# Patient Record
Sex: Female | Born: 1935 | Race: White | Hispanic: No | Marital: Married | Smoking: Former smoker
Health system: Southern US, Community
[De-identification: ages and names within clinical notes are randomized; demographics above are authoritative.]

## PROBLEM LIST (undated history)

## (undated) DIAGNOSIS — J301 Allergic rhinitis due to pollen: Secondary | ICD-10-CM

## (undated) DIAGNOSIS — J3089 Other allergic rhinitis: Secondary | ICD-10-CM

## (undated) DIAGNOSIS — N952 Postmenopausal atrophic vaginitis: Secondary | ICD-10-CM

## (undated) DIAGNOSIS — K921 Melena: Secondary | ICD-10-CM

## (undated) DIAGNOSIS — E785 Hyperlipidemia, unspecified: Secondary | ICD-10-CM

## (undated) DIAGNOSIS — N390 Urinary tract infection, site not specified: Secondary | ICD-10-CM

## (undated) DIAGNOSIS — C801 Malignant (primary) neoplasm, unspecified: Secondary | ICD-10-CM

## (undated) DIAGNOSIS — K219 Gastro-esophageal reflux disease without esophagitis: Secondary | ICD-10-CM

## (undated) DIAGNOSIS — N809 Endometriosis, unspecified: Secondary | ICD-10-CM

## (undated) DIAGNOSIS — G479 Sleep disorder, unspecified: Secondary | ICD-10-CM

## (undated) DIAGNOSIS — M810 Age-related osteoporosis without current pathological fracture: Secondary | ICD-10-CM

## (undated) DIAGNOSIS — M8580 Other specified disorders of bone density and structure, unspecified site: Secondary | ICD-10-CM

## (undated) HISTORY — DX: Other specified disorders of bone density and structure, unspecified site: M85.80

## (undated) HISTORY — DX: Urinary tract infection, site not specified: N39.0

## (undated) HISTORY — PX: APPENDECTOMY: SHX54

## (undated) HISTORY — DX: Endometriosis, unspecified: N80.9

## (undated) HISTORY — DX: Malignant (primary) neoplasm, unspecified: C80.1

## (undated) HISTORY — DX: Allergic rhinitis due to pollen: J30.1

## (undated) HISTORY — DX: Melena: K92.1

## (undated) HISTORY — DX: Sleep disorder, unspecified: G47.9

## (undated) HISTORY — DX: Age-related osteoporosis without current pathological fracture: M81.0

## (undated) HISTORY — DX: Postmenopausal atrophic vaginitis: N95.2

## (undated) HISTORY — DX: Hyperlipidemia, unspecified: E78.5

## (undated) HISTORY — PX: CATARACT EXTRACTION W/ INTRAOCULAR LENS  IMPLANT, BILATERAL: SHX1307

## (undated) HISTORY — PX: ROTATOR CUFF REPAIR: SHX139

## (undated) HISTORY — PX: ABDOMINAL HYSTERECTOMY: SHX81

---

## 2011-08-25 DIAGNOSIS — M8580 Other specified disorders of bone density and structure, unspecified site: Secondary | ICD-10-CM

## 2011-08-25 HISTORY — DX: Other specified disorders of bone density and structure, unspecified site: M85.80

## 2012-01-10 DIAGNOSIS — S52509A Unspecified fracture of the lower end of unspecified radius, initial encounter for closed fracture: Secondary | ICD-10-CM | POA: Diagnosis not present

## 2012-01-10 DIAGNOSIS — S52609A Unspecified fracture of lower end of unspecified ulna, initial encounter for closed fracture: Secondary | ICD-10-CM | POA: Diagnosis not present

## 2012-02-20 DIAGNOSIS — S5290XA Unspecified fracture of unspecified forearm, initial encounter for closed fracture: Secondary | ICD-10-CM | POA: Diagnosis not present

## 2012-04-25 DIAGNOSIS — M25639 Stiffness of unspecified wrist, not elsewhere classified: Secondary | ICD-10-CM | POA: Diagnosis not present

## 2012-04-25 DIAGNOSIS — M25539 Pain in unspecified wrist: Secondary | ICD-10-CM | POA: Diagnosis not present

## 2012-04-25 DIAGNOSIS — S52539A Colles' fracture of unspecified radius, initial encounter for closed fracture: Secondary | ICD-10-CM | POA: Diagnosis not present

## 2012-04-30 DIAGNOSIS — M25539 Pain in unspecified wrist: Secondary | ICD-10-CM | POA: Diagnosis not present

## 2012-04-30 DIAGNOSIS — M25639 Stiffness of unspecified wrist, not elsewhere classified: Secondary | ICD-10-CM | POA: Diagnosis not present

## 2012-04-30 DIAGNOSIS — S52539A Colles' fracture of unspecified radius, initial encounter for closed fracture: Secondary | ICD-10-CM | POA: Diagnosis not present

## 2012-05-08 DIAGNOSIS — M25539 Pain in unspecified wrist: Secondary | ICD-10-CM | POA: Diagnosis not present

## 2012-05-08 DIAGNOSIS — S52539A Colles' fracture of unspecified radius, initial encounter for closed fracture: Secondary | ICD-10-CM | POA: Diagnosis not present

## 2012-05-08 DIAGNOSIS — M25639 Stiffness of unspecified wrist, not elsewhere classified: Secondary | ICD-10-CM | POA: Diagnosis not present

## 2012-05-11 DIAGNOSIS — M810 Age-related osteoporosis without current pathological fracture: Secondary | ICD-10-CM | POA: Diagnosis not present

## 2012-05-11 DIAGNOSIS — E559 Vitamin D deficiency, unspecified: Secondary | ICD-10-CM | POA: Diagnosis not present

## 2012-05-11 DIAGNOSIS — E785 Hyperlipidemia, unspecified: Secondary | ICD-10-CM | POA: Diagnosis not present

## 2012-05-14 DIAGNOSIS — M25539 Pain in unspecified wrist: Secondary | ICD-10-CM | POA: Diagnosis not present

## 2012-05-14 DIAGNOSIS — M25639 Stiffness of unspecified wrist, not elsewhere classified: Secondary | ICD-10-CM | POA: Diagnosis not present

## 2012-05-14 DIAGNOSIS — S52539A Colles' fracture of unspecified radius, initial encounter for closed fracture: Secondary | ICD-10-CM | POA: Diagnosis not present

## 2012-05-16 DIAGNOSIS — M25639 Stiffness of unspecified wrist, not elsewhere classified: Secondary | ICD-10-CM | POA: Diagnosis not present

## 2012-05-16 DIAGNOSIS — S52539A Colles' fracture of unspecified radius, initial encounter for closed fracture: Secondary | ICD-10-CM | POA: Diagnosis not present

## 2012-05-16 DIAGNOSIS — M25539 Pain in unspecified wrist: Secondary | ICD-10-CM | POA: Diagnosis not present

## 2012-05-21 DIAGNOSIS — M25539 Pain in unspecified wrist: Secondary | ICD-10-CM | POA: Diagnosis not present

## 2012-05-21 DIAGNOSIS — M25639 Stiffness of unspecified wrist, not elsewhere classified: Secondary | ICD-10-CM | POA: Diagnosis not present

## 2012-05-21 DIAGNOSIS — S52539A Colles' fracture of unspecified radius, initial encounter for closed fracture: Secondary | ICD-10-CM | POA: Diagnosis not present

## 2012-05-23 DIAGNOSIS — M25539 Pain in unspecified wrist: Secondary | ICD-10-CM | POA: Diagnosis not present

## 2012-05-23 DIAGNOSIS — M25639 Stiffness of unspecified wrist, not elsewhere classified: Secondary | ICD-10-CM | POA: Diagnosis not present

## 2012-05-23 DIAGNOSIS — S52539A Colles' fracture of unspecified radius, initial encounter for closed fracture: Secondary | ICD-10-CM | POA: Diagnosis not present

## 2012-05-29 DIAGNOSIS — S52539A Colles' fracture of unspecified radius, initial encounter for closed fracture: Secondary | ICD-10-CM | POA: Diagnosis not present

## 2012-05-29 DIAGNOSIS — M25639 Stiffness of unspecified wrist, not elsewhere classified: Secondary | ICD-10-CM | POA: Diagnosis not present

## 2012-05-29 DIAGNOSIS — M25539 Pain in unspecified wrist: Secondary | ICD-10-CM | POA: Diagnosis not present

## 2012-05-31 DIAGNOSIS — M25639 Stiffness of unspecified wrist, not elsewhere classified: Secondary | ICD-10-CM | POA: Diagnosis not present

## 2012-05-31 DIAGNOSIS — M25539 Pain in unspecified wrist: Secondary | ICD-10-CM | POA: Diagnosis not present

## 2012-05-31 DIAGNOSIS — S52539A Colles' fracture of unspecified radius, initial encounter for closed fracture: Secondary | ICD-10-CM | POA: Diagnosis not present

## 2012-06-04 DIAGNOSIS — M25539 Pain in unspecified wrist: Secondary | ICD-10-CM | POA: Diagnosis not present

## 2012-06-04 DIAGNOSIS — M25639 Stiffness of unspecified wrist, not elsewhere classified: Secondary | ICD-10-CM | POA: Diagnosis not present

## 2012-06-04 DIAGNOSIS — S52539A Colles' fracture of unspecified radius, initial encounter for closed fracture: Secondary | ICD-10-CM | POA: Diagnosis not present

## 2012-06-07 DIAGNOSIS — S52539A Colles' fracture of unspecified radius, initial encounter for closed fracture: Secondary | ICD-10-CM | POA: Diagnosis not present

## 2012-06-07 DIAGNOSIS — I2510 Atherosclerotic heart disease of native coronary artery without angina pectoris: Secondary | ICD-10-CM | POA: Diagnosis not present

## 2012-06-07 DIAGNOSIS — Z8249 Family history of ischemic heart disease and other diseases of the circulatory system: Secondary | ICD-10-CM | POA: Diagnosis not present

## 2012-06-07 DIAGNOSIS — M25639 Stiffness of unspecified wrist, not elsewhere classified: Secondary | ICD-10-CM | POA: Diagnosis not present

## 2012-06-07 DIAGNOSIS — M25539 Pain in unspecified wrist: Secondary | ICD-10-CM | POA: Diagnosis not present

## 2012-06-07 DIAGNOSIS — Z136 Encounter for screening for cardiovascular disorders: Secondary | ICD-10-CM | POA: Diagnosis not present

## 2012-06-11 DIAGNOSIS — Z961 Presence of intraocular lens: Secondary | ICD-10-CM | POA: Diagnosis not present

## 2012-06-13 DIAGNOSIS — S52539A Colles' fracture of unspecified radius, initial encounter for closed fracture: Secondary | ICD-10-CM | POA: Diagnosis not present

## 2012-06-13 DIAGNOSIS — M25539 Pain in unspecified wrist: Secondary | ICD-10-CM | POA: Diagnosis not present

## 2012-06-13 DIAGNOSIS — M25639 Stiffness of unspecified wrist, not elsewhere classified: Secondary | ICD-10-CM | POA: Diagnosis not present

## 2012-06-15 DIAGNOSIS — S52539A Colles' fracture of unspecified radius, initial encounter for closed fracture: Secondary | ICD-10-CM | POA: Diagnosis not present

## 2012-06-15 DIAGNOSIS — M25639 Stiffness of unspecified wrist, not elsewhere classified: Secondary | ICD-10-CM | POA: Diagnosis not present

## 2012-06-15 DIAGNOSIS — M25539 Pain in unspecified wrist: Secondary | ICD-10-CM | POA: Diagnosis not present

## 2012-06-18 DIAGNOSIS — M25639 Stiffness of unspecified wrist, not elsewhere classified: Secondary | ICD-10-CM | POA: Diagnosis not present

## 2012-06-18 DIAGNOSIS — M25539 Pain in unspecified wrist: Secondary | ICD-10-CM | POA: Diagnosis not present

## 2012-06-18 DIAGNOSIS — S52539A Colles' fracture of unspecified radius, initial encounter for closed fracture: Secondary | ICD-10-CM | POA: Diagnosis not present

## 2012-06-20 DIAGNOSIS — M25639 Stiffness of unspecified wrist, not elsewhere classified: Secondary | ICD-10-CM | POA: Diagnosis not present

## 2012-06-20 DIAGNOSIS — M25539 Pain in unspecified wrist: Secondary | ICD-10-CM | POA: Diagnosis not present

## 2012-06-20 DIAGNOSIS — S52539A Colles' fracture of unspecified radius, initial encounter for closed fracture: Secondary | ICD-10-CM | POA: Diagnosis not present

## 2012-07-07 DIAGNOSIS — Z23 Encounter for immunization: Secondary | ICD-10-CM | POA: Diagnosis not present

## 2012-11-30 DIAGNOSIS — D126 Benign neoplasm of colon, unspecified: Secondary | ICD-10-CM | POA: Diagnosis not present

## 2013-01-16 DIAGNOSIS — K648 Other hemorrhoids: Secondary | ICD-10-CM | POA: Diagnosis not present

## 2013-01-16 DIAGNOSIS — D128 Benign neoplasm of rectum: Secondary | ICD-10-CM | POA: Diagnosis not present

## 2013-01-16 DIAGNOSIS — D129 Benign neoplasm of anus and anal canal: Secondary | ICD-10-CM | POA: Diagnosis not present

## 2013-01-16 DIAGNOSIS — Z86010 Personal history of colonic polyps: Secondary | ICD-10-CM | POA: Diagnosis not present

## 2013-06-13 DIAGNOSIS — I6529 Occlusion and stenosis of unspecified carotid artery: Secondary | ICD-10-CM | POA: Diagnosis not present

## 2013-06-24 LAB — HM COLONOSCOPY: HM Colonoscopy: NORMAL

## 2013-10-15 DIAGNOSIS — J329 Chronic sinusitis, unspecified: Secondary | ICD-10-CM | POA: Diagnosis not present

## 2013-10-29 ENCOUNTER — Encounter: Payer: Self-pay | Admitting: Internal Medicine

## 2013-10-29 ENCOUNTER — Ambulatory Visit (INDEPENDENT_AMBULATORY_CARE_PROVIDER_SITE_OTHER): Payer: Federal, State, Local not specified - PPO | Admitting: Internal Medicine

## 2013-10-29 VITALS — BP 126/70 | HR 71 | Temp 98.6°F | Ht 64.5 in | Wt 136.0 lb

## 2013-10-29 DIAGNOSIS — N952 Postmenopausal atrophic vaginitis: Secondary | ICD-10-CM | POA: Insufficient documentation

## 2013-10-29 DIAGNOSIS — G479 Sleep disorder, unspecified: Secondary | ICD-10-CM

## 2013-10-29 DIAGNOSIS — J301 Allergic rhinitis due to pollen: Secondary | ICD-10-CM | POA: Diagnosis not present

## 2013-10-29 DIAGNOSIS — E785 Hyperlipidemia, unspecified: Secondary | ICD-10-CM | POA: Diagnosis not present

## 2013-10-29 MED ORDER — ESZOPICLONE 2 MG PO TABS: 2.0000 mg | ORAL_TABLET | Freq: Every evening | ORAL | Status: DC | PRN

## 2013-10-29 NOTE — Progress Notes (Signed)
Subjective:    Patient ID: Annette Lee, female    DOB: 12/03/1935, 78 y.o.   MRN: [id number]  HPI Recently moved to [facility] about 2 months ago From [facility] Originally from [place]  Fairly healthy Bad fall allergies Uses benedryl prn-no side effects and it is effective for her Recent sinus infection ---was on augmentin Using hydrocodone syrup at night  Is on statin Had CT in [place]-- showed some calcium Was Rx'd the atorvastatin--- and has been on it since Labs just done  Mild sleep problems Used lunesta regularly till moving back here Now only needs it "if a lot is on my mind"  Uses estrace cream for vaginal atrophy Noted on exam She had no symptoms  No current outpatient prescriptions on file prior to visit.   No current facility-administered medications on file prior to visit.    Allergies  Allergen Reactions  . Contrast Media [Iodinated Diagnostic Agents] Anaphylaxis    Past Medical History  Diagnosis Date  . Vaginal atrophy   . Hyperlipidemia   . Allergic rhinitis due to pollen   . Sleep disturbance     Past Surgical History  Procedure Laterality Date  . Appendectomy    . Abdominal hysterectomy    . Rotator cuff repair Right ~2001  . Cataract extraction w/ intraocular lens  implant, bilateral      Family History  Problem Relation Age of Onset  . Cancer Mother     CLL  . Cancer Father     colon  . Aneurysm Father   . Cancer Brother     colon cancer  . Heart disease Maternal Uncle   . Diabetes Neg Hx     History   Social History  . Marital Status: Married    Spouse Name: N/A    Number of Children: 0  . Years of Education: N/A   Occupational History  . Artist/designer/substitute teacher     Retired   Social History Main Topics  . Smoking status: Never Smoker   . Smokeless tobacco: Never Used  . Alcohol Use: Yes  . Drug Use: No  . Sexual Activity: Not on file   Other Topics Concern  . Not on file   Social  History Narrative   Has living will   Husband is health care POA.   Would accept resuscitation but no prolonged ventilation   Not sure about tube feeds   Review of Systems  Constitutional: Negative for fatigue and unexpected weight change.       Tries to exercise  HENT: Positive for congestion, hearing loss, rhinorrhea and tinnitus.        Fall allergies Hearing off some--?from allergies Regular with dentist  Eyes: Negative for visual disturbance.       Needs new glasses  Respiratory: Negative for cough, chest tightness and shortness of breath.   Cardiovascular: Negative for chest pain, palpitations and leg swelling.  Gastrointestinal: Negative for nausea, vomiting, abdominal pain, constipation and blood in stool.       Rare heartburn--dietary  Genitourinary: Negative for difficulty urinating and dyspareunia.  Musculoskeletal: Negative for back pain and joint swelling.       Right thumb arthritis  Skin: Negative for rash.       Stable dark areas  Allergic/Immunologic: Negative for environmental allergies and immunocompromised state.  Neurological: Negative for dizziness, syncope, light-headedness and headaches.  Psychiatric/Behavioral: Positive for sleep disturbance. Negative for dysphoric mood. The patient is not nervous/anxious.  Objective:   Physical Exam  Constitutional: She appears well-developed and well-nourished. No distress.  HENT:  Mouth/Throat: Oropharynx is clear and moist. No oropharyngeal exudate.  Eyes: Conjunctivae and EOM are normal. Pupils are equal, round, and reactive to light.  Neck: Normal range of motion. Neck supple. No thyromegaly present.  Cardiovascular: Normal rate, regular rhythm, normal heart sounds and intact distal pulses.  Exam reveals no gallop.   No murmur heard. Pulmonary/Chest: Effort normal and breath sounds normal. No respiratory distress. She has no wheezes. She has no rales.  Abdominal: Soft. There is no tenderness.    Musculoskeletal: She exhibits no edema and no tenderness.  Lymphadenopathy:    She has no cervical adenopathy.  Skin: Skin is warm. No rash noted. No erythema.  Psychiatric: She has a normal mood and affect. Her behavior is normal.          Assessment & Plan:

## 2013-10-29 NOTE — Assessment & Plan Note (Signed)
Doing better now Just uses the med rarely

## 2013-10-29 NOTE — Assessment & Plan Note (Signed)
Discussed non sedating antihistamines 

## 2013-10-29 NOTE — Assessment & Plan Note (Signed)
Just on exam---no symptoms I have recommended she stop the estrogen cream

## 2013-10-29 NOTE — Patient Instructions (Signed)
Please try cetirizine 10mg  daily, fexofenadine 180mg  daily or loratadine 10-20mg  daily for your allergies.

## 2013-10-29 NOTE — Assessment & Plan Note (Signed)
Discussed primary prevention She is not sure if she wants to continue

## 2013-10-29 NOTE — Progress Notes (Signed)
Pre-visit discussion using our clinic review tool. No additional management support is needed unless otherwise documented below in the visit note.  

## 2013-10-31 ENCOUNTER — Encounter: Payer: Self-pay | Admitting: Internal Medicine

## 2013-12-04 DIAGNOSIS — E559 Vitamin D deficiency, unspecified: Secondary | ICD-10-CM | POA: Diagnosis not present

## 2013-12-04 DIAGNOSIS — G4700 Insomnia, unspecified: Secondary | ICD-10-CM | POA: Diagnosis not present

## 2013-12-04 DIAGNOSIS — E785 Hyperlipidemia, unspecified: Secondary | ICD-10-CM | POA: Diagnosis not present

## 2014-05-27 ENCOUNTER — Telehealth: Payer: Self-pay | Admitting: Internal Medicine

## 2014-05-27 NOTE — Telephone Encounter (Signed)
[place] with me if ok with Dr. Silvio Pate.

## 2014-05-27 NOTE — Telephone Encounter (Signed)
Patient is asking to switch to Dr.Aron.  Patient said she didn't feel a connection with Dr.Letvak.  Can patient switch to Dr.Aron?

## 2014-05-28 NOTE — Telephone Encounter (Signed)
That is fine 

## 2014-07-08 ENCOUNTER — Ambulatory Visit (INDEPENDENT_AMBULATORY_CARE_PROVIDER_SITE_OTHER): Payer: Medicare Other | Admitting: Family Medicine

## 2014-07-08 ENCOUNTER — Encounter: Payer: Self-pay | Admitting: Family Medicine

## 2014-07-08 VITALS — BP 132/72 | HR 78 | Temp 98.0°F | Ht 64.0 in | Wt 129.8 lb

## 2014-07-08 DIAGNOSIS — E785 Hyperlipidemia, unspecified: Secondary | ICD-10-CM

## 2014-07-08 DIAGNOSIS — Z0000 Encounter for general adult medical examination without abnormal findings: Secondary | ICD-10-CM

## 2014-07-08 DIAGNOSIS — Z23 Encounter for immunization: Secondary | ICD-10-CM | POA: Diagnosis not present

## 2014-07-08 DIAGNOSIS — G479 Sleep disorder, unspecified: Secondary | ICD-10-CM

## 2014-07-08 DIAGNOSIS — N952 Postmenopausal atrophic vaginitis: Secondary | ICD-10-CM

## 2014-07-08 LAB — COMPREHENSIVE METABOLIC PANEL
ALBUMIN: 4 g/dL (ref 3.5–5.2)
ALT: 14 U/L (ref 0–35)
AST: 21 U/L (ref 0–37)
Alkaline Phosphatase: 57 U/L (ref 39–117)
BUN: 16 mg/dL (ref 6–23)
CHLORIDE: 107 meq/L (ref 96–112)
CO2: 28 mEq/L (ref 19–32)
Calcium: 9.5 mg/dL (ref 8.4–10.5)
Creatinine, Ser: 0.8 mg/dL (ref 0.4–1.2)
GFR: 74.11 mL/min (ref >60.00)
Glucose, Bld: 101 mg/dL — ABNORMAL HIGH (ref 70–99)
POTASSIUM: 4.4 meq/L (ref 3.5–5.1)
SODIUM: 141 meq/L (ref 135–145)
TOTAL PROTEIN: 7.1 g/dL (ref 6.0–8.3)
Total Bilirubin: 0.6 mg/dL (ref 0.2–1.2)

## 2014-07-08 LAB — LIPID PANEL
CHOL/HDL RATIO: 4
Cholesterol: 186 mg/dL (ref 0–200)
HDL: 50.7 mg/dL (ref >39.00)
LDL CALC: 124 mg/dL — AB (ref 0–99)
NonHDL: 135.3
Triglycerides: 59 mg/dL (ref 0.0–149.0)
VLDL: 11.8 mg/dL (ref 0.0–40.0)

## 2014-07-08 LAB — TSH: TSH: 1.78 u[IU]/mL (ref 0.35–4.50)

## 2014-07-08 MED ORDER — ESZOPICLONE 2 MG PO TABS: 2.0000 mg | ORAL_TABLET | Freq: Every evening | ORAL | Status: DC | PRN

## 2014-07-08 MED ORDER — ESZOPICLONE 2 MG PO TABS
2.0000 mg | ORAL_TABLET | Freq: Every evening | ORAL | Status: DC | PRN
Start: 2014-07-08 — End: 2014-07-08

## 2014-07-08 MED ORDER — ZOSTER VACCINE LIVE 19400 UNT/0.65ML ~~LOC~~ SOLR: 0.6500 mL | Freq: Once | SUBCUTANEOUS | Status: DC

## 2014-07-08 NOTE — Assessment & Plan Note (Signed)
The patients weight, height, BMI and visual acuity have been recorded in the chart I have made referrals, counseling and provided education to the patient based review of the above and I have provided the pt with a written personalized care plan for preventive services.  Influenza vaccine given today. She is given rx for zostavax- she prefers to take it pharmacy after she calls insurance company about coverage.

## 2014-07-08 NOTE — Progress Notes (Signed)
Pre visit review using our clinic review tool, if applicable. No additional management support is needed unless otherwise documented below in the visit note. 

## 2014-07-08 NOTE — Progress Notes (Signed)
Subjective:   Patient ID: Annette Lee, female    DOB: [birth year], 78 y.o.   MRN: [id number]  Annette Lee is a pleasant 78 y.o. year old female new to me (previous pt of Dr. Silvio Pate), who presents to clinic today to establish care and for Annual Exam  on 07/08/2014  HPI: I have personally reviewed the Medicare Annual Wellness questionnaire and have noted 1. The patient's medical and social history 2. Their use of alcohol, tobacco or illicit drugs 3. Their current medications and supplements 4. The patient's functional ability including ADL's, fall risks, home safety risks and hearing or visual             impairment. 5. Diet and physical activities 6. Evidence for depression or mood disorders  End of life wishes discussed and updated in Social History.  The roster of all physicians providing medical care to patient - is listed in the Snapshot section of the chart.  Has been traveling quite a bit this year.  Moved to [place] this year from [place].  Pneumovax 09/10/13 Mammogram 11/05/12 Colonoscopy 01/16/13- Done in [place]- 3 year recall  HLD-  Has not had lipid panel done here. was taking atorvastatin but stopped taking it but ran out due to her travels.  Insomnia-  Does not take it nightly. Only takes it if a lot is on my mind"   Vaginal atrophy- Was using estrace cream for vaginal atrophy  Recently stopped taking it.  No current outpatient prescriptions on file prior to visit.   No current facility-administered medications on file prior to visit.    Allergies  Allergen Reactions  . Contrast Media [Iodinated Diagnostic Agents] Anaphylaxis    Past Medical History  Diagnosis Date  . Vaginal atrophy   . Hyperlipidemia   . Allergic rhinitis due to pollen   . Sleep disturbance   . Osteopenia 11/12    T -1.8 in hip    Past Surgical History  Procedure Laterality Date  . Appendectomy    . Abdominal hysterectomy    . Rotator cuff repair Right ~2001    . Cataract extraction w/ intraocular lens  implant, bilateral      Family History  Problem Relation Age of Onset  . Cancer Mother     CLL  . Cancer Father     colon  . Aneurysm Father   . Cancer Brother     colon cancer  . Heart disease Maternal Uncle   . Diabetes Neg Hx     History   Social History  . Marital Status: Married    Spouse Name: N/A    Number of Children: 0  . Years of Education: N/A   Occupational History  . Artist/designer/substitute teacher     Retired   Social History Main Topics  . Smoking status: Never Smoker   . Smokeless tobacco: Never Used  . Alcohol Use: Yes  . Drug Use: No  . Sexual Activity: Not on file   Other Topics Concern  . Not on file   Social History Narrative   Has living will   Husband is health care POA.   Would accept resuscitation but no prolonged ventilation   Not sure about tube feeds   The PMH, PSH, Social History, Family History, Medications, and allergies have been reviewed in [facility] and have been updated if relevant.    Review of Systems See HPI Patient reports no  vision/ hearing changes,anorexia, weight change, fever ,adenopathy, persistant / recurrent hoarseness, swallowing  issues, chest pain, edema,persistant / recurrent cough, hemoptysis, dyspnea(rest, exertional, paroxysmal nocturnal), gastrointestinal  bleeding (melena, rectal bleeding), abdominal pain, excessive heart burn, GU symptoms(dysuria, hematuria, pyuria, voiding/incontinence  Issues) syncope, focal weakness, severe memory loss, concerning skin lesions, depression, anxiety, abnormal bruising/bleeding, major joint swelling, breast masses or abnormal vaginal bleeding.       Objective:    Pulse 78  Temp(Src) 98 F (36.7 C) (Oral)  Ht 5\' 4"  (1.626 m)  Wt 129 lb 12 oz (58.854 kg)  BMI 22.26 kg/m2  SpO2 98%   Physical Exam  Nursing note and vitals reviewed. Constitutional: She is oriented to person, place, and time. She appears well-developed and  well-nourished. No distress.  HENT:  Head: Normocephalic.  Eyes: Pupils are equal, round, and reactive to light.  Neck: Normal range of motion. Neck supple. No thyromegaly present.  Cardiovascular: Normal rate, regular rhythm and normal heart sounds.   Pulmonary/Chest: Effort normal and breath sounds normal. No respiratory distress. She has no wheezes.  Abdominal: Soft. Bowel sounds are normal. She exhibits no distension. There is no tenderness.  Musculoskeletal: Normal range of motion.  Neurological: She is alert and oriented to person, place, and time. No cranial nerve deficit.  Skin: Skin is warm and dry.  Psychiatric: She has a normal mood and affect. Her behavior is normal. Judgment and thought content normal.          Assessment & Plan:   No diagnosis found. No Follow-up on file.

## 2014-07-08 NOTE — Assessment & Plan Note (Signed)
Diet controlled. Has stopped taking lipitor. Will check lipid panel today.

## 2014-07-08 NOTE — Patient Instructions (Addendum)
It was great to meet you. Check with your insurance to see if they will cover the shingles shot.  Please return after 09/10/14 for your prevnar 13 (pneumonia booster) vaccine.   We will send you your lab results.  You can also view them online.

## 2014-07-08 NOTE — Assessment & Plan Note (Signed)
Well controlled on prn lunesta.

## 2014-07-09 ENCOUNTER — Encounter: Payer: Self-pay | Admitting: *Deleted

## 2014-07-09 LAB — CBC WITH DIFFERENTIAL/PLATELET
Basophils Absolute: 0 10*3/uL (ref 0.0–0.1)
Basophils Relative: 0.5 % (ref 0.0–3.0)
EOS ABS: 0.2 10*3/uL (ref 0.0–0.7)
EOS PCT: 3.1 % (ref 0.0–5.0)
HCT: 44.7 % (ref 36.0–46.0)
Hemoglobin: 15.1 g/dL — ABNORMAL HIGH (ref 12.0–15.0)
LYMPHS PCT: 23 % (ref 12.0–46.0)
Lymphs Abs: 1.3 10*3/uL (ref 0.7–4.0)
MCHC: 33.8 g/dL (ref 30.0–36.0)
MCV: 92.5 fl (ref 78.0–100.0)
MONO ABS: 0.4 10*3/uL (ref 0.1–1.0)
Monocytes Relative: 6.2 % (ref 3.0–12.0)
NEUTROS PCT: 67.2 % (ref 43.0–77.0)
Neutro Abs: 3.9 10*3/uL (ref 1.4–7.7)
PLATELETS: 171 10*3/uL (ref 150.0–400.0)
RBC: 4.84 Mil/uL (ref 3.87–5.11)
RDW: 13.4 % (ref 11.5–15.5)
WBC: 5.9 10*3/uL (ref 4.0–10.5)

## 2014-07-28 DIAGNOSIS — H10411 Chronic giant papillary conjunctivitis, right eye: Secondary | ICD-10-CM | POA: Diagnosis not present

## 2014-09-11 ENCOUNTER — Ambulatory Visit (INDEPENDENT_AMBULATORY_CARE_PROVIDER_SITE_OTHER): Payer: Medicare Other

## 2014-09-11 DIAGNOSIS — Z23 Encounter for immunization: Secondary | ICD-10-CM | POA: Diagnosis not present

## 2015-07-13 ENCOUNTER — Encounter: Payer: Self-pay | Admitting: Family Medicine

## 2015-07-13 ENCOUNTER — Ambulatory Visit (INDEPENDENT_AMBULATORY_CARE_PROVIDER_SITE_OTHER): Payer: Medicare Other | Admitting: Family Medicine

## 2015-07-13 ENCOUNTER — Encounter: Payer: Self-pay | Admitting: *Deleted

## 2015-07-13 VITALS — BP 132/80 | HR 80 | Temp 97.7°F | Ht 64.0 in | Wt 128.5 lb

## 2015-07-13 DIAGNOSIS — Z0000 Encounter for general adult medical examination without abnormal findings: Secondary | ICD-10-CM

## 2015-07-13 DIAGNOSIS — Z23 Encounter for immunization: Secondary | ICD-10-CM

## 2015-07-13 DIAGNOSIS — J301 Allergic rhinitis due to pollen: Secondary | ICD-10-CM

## 2015-07-13 DIAGNOSIS — G479 Sleep disorder, unspecified: Secondary | ICD-10-CM

## 2015-07-13 DIAGNOSIS — E785 Hyperlipidemia, unspecified: Secondary | ICD-10-CM

## 2015-07-13 DIAGNOSIS — Z1239 Encounter for other screening for malignant neoplasm of breast: Secondary | ICD-10-CM

## 2015-07-13 DIAGNOSIS — N952 Postmenopausal atrophic vaginitis: Secondary | ICD-10-CM

## 2015-07-13 DIAGNOSIS — Z01419 Encounter for gynecological examination (general) (routine) without abnormal findings: Secondary | ICD-10-CM | POA: Insufficient documentation

## 2015-07-13 LAB — CBC WITH DIFFERENTIAL/PLATELET
Basophils Absolute: 0 10*3/uL (ref 0.0–0.1)
Basophils Relative: 0.5 % (ref 0.0–3.0)
EOS ABS: 0.2 10*3/uL (ref 0.0–0.7)
Eosinophils Relative: 3 % (ref 0.0–5.0)
HCT: 43.9 % (ref 36.0–46.0)
HEMOGLOBIN: 14.8 g/dL (ref 12.0–15.0)
Lymphocytes Relative: 19.8 % (ref 12.0–46.0)
Lymphs Abs: 1.3 10*3/uL (ref 0.7–4.0)
MCHC: 33.7 g/dL (ref 30.0–36.0)
MCV: 91.4 fl (ref 78.0–100.0)
MONO ABS: 0.6 10*3/uL (ref 0.1–1.0)
Monocytes Relative: 9.7 % (ref 3.0–12.0)
NEUTROS ABS: 4.4 10*3/uL (ref 1.4–7.7)
NEUTROS PCT: 67 % (ref 43.0–77.0)
PLATELETS: 196 10*3/uL (ref 150.0–400.0)
RBC: 4.8 Mil/uL (ref 3.87–5.11)
RDW: 13.4 % (ref 11.5–15.5)
WBC: 6.5 10*3/uL (ref 4.0–10.5)

## 2015-07-13 LAB — LIPID PANEL
CHOLESTEROL: 190 mg/dL (ref 0–200)
HDL: 53.6 mg/dL (ref >39.00)
LDL CALC: 117 mg/dL — AB (ref 0–99)
NonHDL: 135.95
TRIGLYCERIDES: 96 mg/dL (ref 0.0–149.0)
Total CHOL/HDL Ratio: 4
VLDL: 19.2 mg/dL (ref 0.0–40.0)

## 2015-07-13 LAB — COMPREHENSIVE METABOLIC PANEL
ALBUMIN: 4.1 g/dL (ref 3.5–5.2)
ALT: 12 U/L (ref 0–35)
AST: 21 U/L (ref 0–37)
Alkaline Phosphatase: 56 U/L (ref 39–117)
BUN: 18 mg/dL (ref 6–23)
CHLORIDE: 105 meq/L (ref 96–112)
CO2: 29 mEq/L (ref 19–32)
CREATININE: 0.76 mg/dL (ref 0.40–1.20)
Calcium: 9.8 mg/dL (ref 8.4–10.5)
GFR: 78.42 mL/min (ref >60.00)
GLUCOSE: 80 mg/dL (ref 70–99)
POTASSIUM: 4.6 meq/L (ref 3.5–5.1)
SODIUM: 139 meq/L (ref 135–145)
Total Bilirubin: 0.7 mg/dL (ref 0.2–1.2)
Total Protein: 7.1 g/dL (ref 6.0–8.3)

## 2015-07-13 LAB — TSH: TSH: 1.59 u[IU]/mL (ref 0.35–4.50)

## 2015-07-13 MED ORDER — SIMVASTATIN 10 MG PO TABS: 10.0000 mg | ORAL_TABLET | Freq: Every day | ORAL | Status: DC

## 2015-07-13 NOTE — Assessment & Plan Note (Signed)
Symptoms controlled with as needed antihistamine.

## 2015-07-13 NOTE — Patient Instructions (Signed)
Great to see you. We will call you with your lab results and you view them online.  Please call [facility] to set up your mammogram.

## 2015-07-13 NOTE — Assessment & Plan Note (Addendum)
Essentially diet controlled- takes simvastatin 10 mg weekly. Check labs today.

## 2015-07-13 NOTE — Progress Notes (Signed)
Pre visit review using our clinic review tool, if applicable. No additional management support is needed unless otherwise documented below in the visit note. 

## 2015-07-13 NOTE — Progress Notes (Addendum)
Subjective:   Patient ID: Annette Lee, female    DOB: [birth year], 79 y.o.   MRN: [id number]  Annette Lee is a pleasant 79 y.o. year old female, whom I have no seen since she established care last year at her wellness visit,  presents to clinic today to establish care and for Annual Exam and Foot Pain  on 07/13/2015  HPI: I have personally reviewed the Medicare Annual Wellness questionnaire and have noted 1. The patient's medical and social history 2. Their use of alcohol, tobacco or illicit drugs 3. Their current medications and supplements 4. The patient's functional ability including ADL's, fall risks, home safety risks and hearing or visual             impairment. 5. Diet and physical activities 6. Evidence for depression or mood disorders  End of life wishes discussed and updated in Social History.  The roster of all physicians providing medical care to patient - is listed in the Snapshot section of the chart.  Has been traveling quite a bit this year.  Walking 3 miles a day. Feels great.   Pneumovax 09/10/13 Prevnar 13 09/11/14 Mammogram 11/05/12 Colonoscopy 01/16/13- Done in [place]- 3 year recall Zoster 09/11/14 Remote history of hysterectomy Due for mammogram  HLD- has been taking Simvastatin 10 mg weekly.  Lab Results  Component Value Date   CHOL 186 07/08/2014   HDL 50.70 07/08/2014   LDLCALC 124* 07/08/2014   TRIG 59.0 07/08/2014   CHOLHDL 4 07/08/2014    Insomnia- Lunesta. Does not take it nightly.  Allergic rhinitis- has fall allergies.  Taking OTC antihistamine.  No current outpatient prescriptions on file prior to visit.   No current facility-administered medications on file prior to visit.    Allergies  Allergen Reactions  . Contrast Media [Iodinated Diagnostic Agents] Anaphylaxis    Past Medical History  Diagnosis Date  . Vaginal atrophy   . Hyperlipidemia   . Allergic rhinitis due to pollen   . Sleep disturbance   . Osteopenia  11/12    T -1.8 in hip    Past Surgical History  Procedure Laterality Date  . Appendectomy    . Abdominal hysterectomy    . Rotator cuff repair Right ~2001  . Cataract extraction w/ intraocular lens  implant, bilateral      Family History  Problem Relation Age of Onset  . Cancer Mother     CLL  . Cancer Father     colon  . Aneurysm Father   . Cancer Brother     colon cancer  . Heart disease Maternal Uncle   . Diabetes Neg Hx     Social History   Social History  . Marital Status: Married    Spouse Name: N/A  . Number of Children: 0  . Years of Education: N/A   Occupational History  . Artist/designer/substitute teacher     Retired   Social History Main Topics  . Smoking status: Never Smoker   . Smokeless tobacco: Never Used  . Alcohol Use: Yes  . Drug Use: No  . Sexual Activity: Not on file   Other Topics Concern  . Not on file   Social History Narrative   Has living will   Husband is health care POA.   Would accept resuscitation but no prolonged ventilation   Not sure about tube feeds   The PMH, PSH, Social History, Family History, Medications, and allergies have been reviewed in [facility], and have been updated if relevant.  Review of Systems  Constitutional: Negative.   HENT: Negative.   Eyes: Negative.   Respiratory: Negative.   Cardiovascular: Negative.   Gastrointestinal: Negative.   Endocrine: Negative.   Genitourinary: Negative.   Musculoskeletal: Negative.   Skin: Negative.   Allergic/Immunologic: Negative.   Neurological: Negative.   Hematological: Negative.   Psychiatric/Behavioral: Negative.   All other systems reviewed and are negative.        Objective:    BP 132/80 mmHg  Pulse 80  Temp(Src) 97.7 F (36.5 C) (Oral)  Ht 5\' 4"  (1.626 m)  Wt 128 lb 8 oz (58.287 kg)  BMI 22.05 kg/m2  SpO2 98%   Physical Exam  Constitutional: She is oriented to person, place, and time. She appears well-developed and well-nourished. No  distress.  HENT:  Head: Normocephalic.  Eyes: Pupils are equal, round, and reactive to light.  Neck: Normal range of motion. Neck supple. No thyromegaly present.  Cardiovascular: Normal rate, regular rhythm and normal heart sounds.   Pulmonary/Chest: Effort normal and breath sounds normal. No respiratory distress. She has no wheezes.  Abdominal: Soft. Bowel sounds are normal. She exhibits no distension. There is no tenderness.  Musculoskeletal: Normal range of motion.  Neurological: She is alert and oriented to person, place, and time. No cranial nerve deficit.  Skin: Skin is warm and dry.  Psychiatric: She has a normal mood and affect. Her behavior is normal. Judgment and thought content normal.  Nursing note and vitals reviewed.         Assessment & Plan:   Medicare annual wellness visit, subsequent - Plan: CBC with Differential/Platelet, Comprehensive metabolic panel, Lipid panel, TSH  Vaginal atrophy  Hyperlipidemia  Allergic rhinitis due to pollen  Sleep disturbance  Need for influenza vaccination - Plan: Flu Vaccine QUAD 36+ mos PF IM (Fluarix & Fluzone Quad PF) No Follow-up on file.

## 2015-07-13 NOTE — Addendum Note (Signed)
Addended by: Lucille Passy on: 07/13/2015 09:09 AM   Modules accepted: Orders, SmartSet

## 2015-07-13 NOTE — Assessment & Plan Note (Signed)
Well controlled on prn luensta.

## 2015-07-13 NOTE — Assessment & Plan Note (Signed)
The patients weight, height, BMI and visual acuity have been recorded in the chart.  Cognitive function assessed.   I have made referrals, counseling and provided education to the patient based review of the above and I have provided the pt with a written personalized care plan for preventive services.  Influenza vaccine given today. 

## 2015-07-21 ENCOUNTER — Ambulatory Visit
Admission: RE | Admit: 2015-07-21 | Discharge: 2015-07-21 | Disposition: A | Discharge status: Home / Self Care | Payer: Medicare Other | Source: Ambulatory Visit | Attending: Family Medicine | Admitting: Family Medicine

## 2015-07-21 ENCOUNTER — Other Ambulatory Visit: Payer: Self-pay | Admitting: Family Medicine

## 2015-07-21 DIAGNOSIS — Z1231 Encounter for screening mammogram for malignant neoplasm of breast: Secondary | ICD-10-CM | POA: Insufficient documentation

## 2015-07-21 DIAGNOSIS — Z1239 Encounter for other screening for malignant neoplasm of breast: Secondary | ICD-10-CM

## 2015-08-05 ENCOUNTER — Ambulatory Visit (INDEPENDENT_AMBULATORY_CARE_PROVIDER_SITE_OTHER): Payer: Medicare Other | Admitting: Family Medicine

## 2015-08-05 ENCOUNTER — Encounter: Payer: Self-pay | Admitting: Family Medicine

## 2015-08-05 VITALS — BP 120/62 | HR 66 | Temp 97.8°F | Ht 64.0 in | Wt 130.0 lb

## 2015-08-05 DIAGNOSIS — M216X9 Other acquired deformities of unspecified foot: Secondary | ICD-10-CM | POA: Diagnosis not present

## 2015-08-05 DIAGNOSIS — M7741 Metatarsalgia, right foot: Secondary | ICD-10-CM | POA: Diagnosis not present

## 2015-08-05 DIAGNOSIS — M2010 Hallux valgus (acquired), unspecified foot: Secondary | ICD-10-CM

## 2015-08-05 DIAGNOSIS — L84 Corns and callosities: Secondary | ICD-10-CM

## 2015-08-05 DIAGNOSIS — M7742 Metatarsalgia, left foot: Secondary | ICD-10-CM

## 2015-08-05 MED ORDER — UREA 40 % EX CREA: TOPICAL_CREAM | CUTANEOUS | Status: DC

## 2015-08-05 NOTE — Progress Notes (Signed)
Dr. Frederico Hamman T. Copland, MD, [facility] [place], [address] Phone: [phone] Fax: [phone]  08/05/2015  Patient: Annette Lee, MRN: [id number], DOB: 02/01/1936, 79 y.o.  Primary Physician:  Arnette Norris, MD  Chief Complaint: Foot Pain  Subjective:   Annette Lee is a 79 y.o. very pleasant female patient who presents with the following:  The patient has been having pain on the plantar aspect of her metatarsal region in the region of some very large calluses off and on for years.  She does use some corn pads, which helped, but they fall off relatively quickly, and she is only able to use them 1 time per use or application.  She has not had any significant trauma and she has no swelling.  The pain is essentially isolated to the callus region.  She also has some extension bunion formation, hammertoes, but moderately preserved longitudinal arch.  Very large calluses. Bunion, hammer toes.  This summer started walking 2 times.  Right.   Past Medical History, Surgical History, Social History, Family History, Problem List, Medications, and Allergies have been reviewed and updated if relevant.  Patient Active Problem List   Diagnosis Date Noted  . Medicare annual wellness visit, subsequent 07/13/2015  . Vaginal atrophy   . Hyperlipidemia   . Allergic rhinitis due to pollen   . Sleep disturbance     Past Medical History  Diagnosis Date  . Vaginal atrophy   . Hyperlipidemia   . Allergic rhinitis due to pollen   . Sleep disturbance   . Osteopenia 11/12    T -1.8 in hip    Past Surgical History  Procedure Laterality Date  . Appendectomy    . Abdominal hysterectomy    . Rotator cuff repair Right ~2001  . Cataract extraction w/ intraocular lens  implant, bilateral      Social History   Social History  . Marital Status: Married    Spouse Name: N/A  . Number of Children: 0  . Years of Education: N/A    Occupational History  . Artist/designer/substitute teacher     Retired   Social History Main Topics  . Smoking status: Never Smoker   . Smokeless tobacco: Never Used  . Alcohol Use: Yes  . Drug Use: No  . Sexual Activity: Not on file   Other Topics Concern  . Not on file   Social History Narrative   Has living will   Husband is health care POA.   Would accept resuscitation but no prolonged ventilation   Not sure about tube feeds    Family History  Problem Relation Age of Onset  . Cancer Mother     CLL  . Cancer Father     colon  . Aneurysm Father   . Cancer Brother     colon cancer  . Heart disease Maternal Uncle   . Diabetes Neg Hx   . Breast cancer Neg Hx     Allergies  Allergen Reactions  . Contrast Media [Iodinated Diagnostic Agents] Anaphylaxis    Medication list reviewed and updated in full in [facility].  GEN: No fevers, chills. Nontoxic. Primarily MSK c/o today. MSK: Detailed in the HPI GI: tolerating PO intake without difficulty Neuro: No numbness, parasthesias, or tingling associated. Otherwise the pertinent positives of the ROS are noted above.   Objective:   BP 120/62 mmHg  Pulse 66  Temp(Src) 97.8 F (36.6 C) (Oral)  Ht  5\' 4"  (1.626 m)  Wt 130 lb (58.968 kg)  BMI 22.30 kg/m2   GEN: WDWN, NAD, Non-toxic, Alert & Oriented x 3 HEENT: Atraumatic, Normocephalic.  Ears and Nose: No external deformity. EXTR: No clubbing/cyanosis/edema NEURO: Normal gait.  PSYCH: Normally interactive. Conversant. Not depressed or anxious appearing.  Calm demeanor.   FEET: B Echymosis: no Edema: no ROM: full LE B Gait: heel toe, non-antalgic MT pain: no Callus pattern: none Lateral Mall: NT Medial Mall: NT Talus: NT Navicular: NT Cuboid: NT Calcaneous: NT Metatarsals: NT 5th MT: NT Phalanges: NT Achilles: NT Plantar Fascia: NT Fat Pad: NT Peroneals: NT Post Tib: NT Great Toe: Nml motion Ant Drawer: neg ATFL: NT CFL: NT Deltoid:  NT Other foot breakdown: none Long arch: natural pes cavus foot. Transverse arch: Extensive forefoot breakdown.  All metatarsal head of dropped.  Extensive dramatic callus formation throughout most of the entirety of the forefoot.  Somewhat tender to palpation in this region on the plantar aspect in the region of the calluses. Hindfoot breakdown: none Sensation: intact   Radiology:  Assessment and Plan:   Metatarsalgia of both feet  Corns and callus  Loss of transverse plantar arch, unspecified laterality  Asymptomatic bunion, unspecified laterality  >25 minutes spent in face to face time with patient, >50% spent in counselling or coordination of care: the patient has extensive forefoot collapse, with minimal padding left.  All of her metatarsal heads of dropped, and she has extensive callus formation throughout, which is painful on the right foot.  I think trying to different things here at different times to see which helps the best would be a reasonable idea.  A custom crafted a poor on bar, and cut out some additional padding to put on the undersurface of her insole.  She found this comfortable. I also gave her a metatarsal pad to try at a different time.   Urea 40% BID, to soften callus, then pair down, which will also likely help.   Follow-up: prn  New Prescriptions   UREA (CARMOL) 40 % CREA    Apply to callus BID   Signed,  Spencer T. Copland, MD   Patient's Medications  New Prescriptions   UREA (CARMOL) 40 % CREA    Apply to callus BID  Previous Medications   SIMVASTATIN (ZOCOR) 10 MG TABLET    Take 10 mg by mouth every 7 (seven) days.  Modified Medications   No medications on file  Discontinued Medications   SIMVASTATIN (ZOCOR) 10 MG TABLET    Take 1 tablet (10 mg total) by mouth at bedtime.

## 2015-08-05 NOTE — Progress Notes (Signed)
Pre visit review using our clinic review tool, if applicable. No additional management support is needed unless otherwise documented below in the visit note. 

## 2015-08-11 ENCOUNTER — Encounter: Payer: Self-pay | Admitting: Family Medicine

## 2015-12-17 ENCOUNTER — Ambulatory Visit (INDEPENDENT_AMBULATORY_CARE_PROVIDER_SITE_OTHER): Payer: Medicare Other | Admitting: Family Medicine

## 2015-12-17 ENCOUNTER — Encounter: Payer: Self-pay | Admitting: Family Medicine

## 2015-12-17 VITALS — BP 136/70 | HR 68 | Temp 98.1°F | Wt 130.5 lb

## 2015-12-17 DIAGNOSIS — Z860100 Personal history of colon polyps, unspecified: Secondary | ICD-10-CM | POA: Insufficient documentation

## 2015-12-17 DIAGNOSIS — L601 Onycholysis: Secondary | ICD-10-CM

## 2015-12-17 DIAGNOSIS — R194 Change in bowel habit: Secondary | ICD-10-CM

## 2015-12-17 DIAGNOSIS — M545 Low back pain, unspecified: Secondary | ICD-10-CM | POA: Insufficient documentation

## 2015-12-17 HISTORY — DX: Personal history of colon polyps, unspecified: Z86.0100

## 2015-12-17 NOTE — Patient Instructions (Addendum)
Great to see you. Please stop by to see Rosaria Ferries on your way out.  Stop by the pharmacy and ask for over the counter Lamisil.

## 2015-12-17 NOTE — Assessment & Plan Note (Signed)
New- advised OTC lamasil. Call or return to clinic prn if these symptoms worsen or fail to improve as anticipated. The patient indicates understanding of these issues and agrees with the plan.

## 2015-12-17 NOTE — Assessment & Plan Note (Signed)
Intermittent and improving. Exam reassuring. 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 Imaging deferred. The patient indicates understanding of these issues and agrees with the plan.

## 2015-12-17 NOTE — Progress Notes (Signed)
Subjective:   Patient ID: Annette Lee, female    DOB: Mar 18, 1936, 80 y.o.   MRN: [id number]  Annette Lee is a pleasant 80 y.o. year old female who presents to clinic today with loose bowels and Back Pain  on 12/17/2015  HPI:  Loose bowels- for years, has not had "formed bowel movements."  Eats a very healthy diet high in fiber.  While this has been ongoing for a couple of years, it is a change from her previously formed BMs. Does have a h/o polyps.  Last colonoscopy (report scanned in Epic) was done in [place] on 01/16/13- advised 3 year recall. Denies any blood or mucous in her stools. No lower abdominal cramping. Denies any fecal incontinence.  Intermittent low back pain for a month.  No LE numbness or weakness.  Was bending to pick up something when this occurred. No dysuria. She is very active. She does feel it is improving.  Discoloration of right great toe nail- noticed it a few weeks ago.  Current Outpatient Prescriptions on File Prior to Visit  Medication Sig Dispense Refill  . simvastatin (ZOCOR) 10 MG tablet Take 10 mg by mouth every 7 (seven) days.     No current facility-administered medications on file prior to visit.    Allergies  Allergen Reactions  . Contrast Media [Iodinated Diagnostic Agents] Anaphylaxis    Past Medical History  Diagnosis Date  . Vaginal atrophy   . Hyperlipidemia   . Allergic rhinitis due to pollen   . Sleep disturbance   . Osteopenia 11/12    T -1.8 in hip    Past Surgical History  Procedure Laterality Date  . Appendectomy    . Abdominal hysterectomy    . Rotator cuff repair Right ~2001  . Cataract extraction w/ intraocular lens  implant, bilateral      Family History  Problem Relation Age of Onset  . Cancer Mother     CLL  . Cancer Father     colon  . Aneurysm Father   . Cancer Brother     colon cancer  . Heart disease Maternal Uncle   . Diabetes Neg Hx   . Breast cancer Neg Hx     Social History   Social History    . Marital Status: Married    Spouse Name: N/A  . Number of Children: 0  . Years of Education: N/A   Occupational History  . Artist/designer/substitute teacher     Retired   Social History Main Topics  . Smoking status: Never Smoker   . Smokeless tobacco: Never Used  . Alcohol Use: Yes  . Drug Use: No  . Sexual Activity: Not on file   Other Topics Concern  . Not on file   Social History Narrative   Has living will   Husband is health care POA.   Would accept resuscitation but no prolonged ventilation   Not sure about tube feeds   The PMH, PSH, Social History, Family History, Medications, and allergies have been reviewed in [facility], and have been updated if relevant.   Review of Systems  Constitutional: Negative.   Gastrointestinal: Negative for nausea, vomiting, abdominal pain, diarrhea, constipation, blood in stool, abdominal distention, anal bleeding and rectal pain.  Genitourinary: Negative.   Musculoskeletal: Positive for back pain. Negative for gait problem.  Neurological: Negative.   All other systems reviewed and are negative.      Objective:    BP 136/70 mmHg  Pulse 68  Temp(Src) 98.1  F (36.7 C) (Oral)  Wt 130 lb 8 oz (59.194 kg)  SpO2 98%   Physical Exam  Constitutional: She is oriented to person, place, and time. She appears well-developed and well-nourished. No distress.  HENT:  Head: Normocephalic.  Eyes: Conjunctivae are normal.  Neck: Normal range of motion.  Cardiovascular: Normal rate and regular rhythm.   Pulmonary/Chest: Effort normal and breath sounds normal.  Abdominal: Soft. Bowel sounds are normal. She exhibits no distension and no mass. There is no tenderness. There is no rebound and no guarding.  Musculoskeletal: Normal range of motion.       Lumbar back: Normal. She exhibits normal range of motion, no tenderness, no bony tenderness, no swelling, no edema, no deformity, no laceration, no pain, no spasm and normal pulse.  Neurological:  She is alert and oriented to person, place, and time.  Skin: Skin is warm and dry. She is not diaphoretic.     Psychiatric: She has a normal mood and affect. Her behavior is normal. Judgment and thought content normal.  Nursing note and vitals reviewed.         Assessment & Plan:   Bowel habit changes - Plan: Ambulatory referral to Gastroenterology  History of colonic polyps - Plan: Ambulatory referral to Gastroenterology  Bilateral low back pain without sciatica  Onycholysis of toenail No Follow-up on file.

## 2015-12-17 NOTE — Assessment & Plan Note (Signed)
No red flag symptoms, likely due to diet. Due for colonoscopy, refer to GI.

## 2015-12-24 DIAGNOSIS — R112 Nausea with vomiting, unspecified: Secondary | ICD-10-CM | POA: Diagnosis not present

## 2015-12-24 DIAGNOSIS — Z9889 Other specified postprocedural states: Secondary | ICD-10-CM | POA: Diagnosis not present

## 2015-12-24 DIAGNOSIS — G479 Sleep disorder, unspecified: Secondary | ICD-10-CM | POA: Insufficient documentation

## 2015-12-24 DIAGNOSIS — Z1211 Encounter for screening for malignant neoplasm of colon: Secondary | ICD-10-CM | POA: Diagnosis not present

## 2016-01-21 ENCOUNTER — Encounter: Payer: Self-pay | Admitting: *Deleted

## 2016-01-22 ENCOUNTER — Ambulatory Visit: Payer: Medicare Other | Admitting: Anesthesiology

## 2016-01-22 ENCOUNTER — Ambulatory Visit
Admission: RE | Admit: 2016-01-22 | Discharge: 2016-01-22 | Disposition: A | Discharge status: Home / Self Care | Payer: Medicare Other | Source: Ambulatory Visit | Attending: Gastroenterology | Admitting: Gastroenterology

## 2016-01-22 ENCOUNTER — Encounter: Payer: Self-pay | Admitting: *Deleted

## 2016-01-22 ENCOUNTER — Encounter
Admission: RE | Disposition: A | Discharge status: Home / Self Care | Payer: Self-pay | Source: Ambulatory Visit | Attending: Gastroenterology

## 2016-01-22 DIAGNOSIS — Z800 Family history of malignant neoplasm of digestive organs: Secondary | ICD-10-CM | POA: Diagnosis not present

## 2016-01-22 DIAGNOSIS — Z86010 Personal history of colonic polyps: Secondary | ICD-10-CM | POA: Insufficient documentation

## 2016-01-22 DIAGNOSIS — Z91041 Radiographic dye allergy status: Secondary | ICD-10-CM | POA: Diagnosis not present

## 2016-01-22 DIAGNOSIS — Z1211 Encounter for screening for malignant neoplasm of colon: Secondary | ICD-10-CM | POA: Insufficient documentation

## 2016-01-22 DIAGNOSIS — M8580 Other specified disorders of bone density and structure, unspecified site: Secondary | ICD-10-CM | POA: Diagnosis not present

## 2016-01-22 DIAGNOSIS — J301 Allergic rhinitis due to pollen: Secondary | ICD-10-CM | POA: Insufficient documentation

## 2016-01-22 DIAGNOSIS — G479 Sleep disorder, unspecified: Secondary | ICD-10-CM | POA: Diagnosis not present

## 2016-01-22 DIAGNOSIS — Z90710 Acquired absence of both cervix and uterus: Secondary | ICD-10-CM | POA: Insufficient documentation

## 2016-01-22 DIAGNOSIS — K660 Peritoneal adhesions (postprocedural) (postinfection): Secondary | ICD-10-CM | POA: Insufficient documentation

## 2016-01-22 DIAGNOSIS — Z79899 Other long term (current) drug therapy: Secondary | ICD-10-CM | POA: Diagnosis not present

## 2016-01-22 DIAGNOSIS — Z87891 Personal history of nicotine dependence: Secondary | ICD-10-CM | POA: Diagnosis not present

## 2016-01-22 DIAGNOSIS — E785 Hyperlipidemia, unspecified: Secondary | ICD-10-CM | POA: Diagnosis not present

## 2016-01-22 DIAGNOSIS — Z538 Procedure and treatment not carried out for other reasons: Secondary | ICD-10-CM | POA: Diagnosis not present

## 2016-01-22 DIAGNOSIS — N952 Postmenopausal atrophic vaginitis: Secondary | ICD-10-CM | POA: Diagnosis not present

## 2016-01-22 DIAGNOSIS — K644 Residual hemorrhoidal skin tags: Secondary | ICD-10-CM | POA: Diagnosis not present

## 2016-01-22 HISTORY — PX: COLONOSCOPY WITH PROPOFOL: SHX5780

## 2016-01-22 SURGERY — COLONOSCOPY WITH PROPOFOL
Anesthesia: General

## 2016-01-22 MED ORDER — MIDAZOLAM HCL 2 MG/2ML IJ SOLN
INTRAMUSCULAR | Status: DC | PRN
  Administered 2016-01-22: 1 mg via INTRAVENOUS

## 2016-01-22 MED ORDER — LIDOCAINE HCL (CARDIAC) 20 MG/ML IV SOLN
INTRAVENOUS | Status: DC | PRN
  Administered 2016-01-22: 60 mg via INTRAVENOUS

## 2016-01-22 MED ORDER — PHENYLEPHRINE HCL 10 MG/ML IJ SOLN
INTRAMUSCULAR | Status: DC | PRN
  Administered 2016-01-22 (×2): 100 ug via INTRAVENOUS

## 2016-01-22 MED ORDER — SODIUM CHLORIDE 0.9 % IV SOLN
INTRAVENOUS | Status: DC
Start: 2016-01-22 — End: 2016-01-22
  Administered 2016-01-22: 1000 mL via INTRAVENOUS

## 2016-01-22 MED ORDER — PROPOFOL 10 MG/ML IV BOLUS
INTRAVENOUS | Status: DC | PRN
  Administered 2016-01-22: 50 mg via INTRAVENOUS

## 2016-01-22 MED ORDER — PROPOFOL 500 MG/50ML IV EMUL
INTRAVENOUS | Status: DC | PRN
  Administered 2016-01-22: 150 ug/kg/min via INTRAVENOUS

## 2016-01-22 NOTE — Transfer of Care (Signed)
Immediate Anesthesia Transfer of Care Note  Patient: Annette Lee  Procedure(s) Performed: Procedure(s): COLONOSCOPY WITH PROPOFOL (N/A)  Patient Location: PACU  Anesthesia Type:General  Level of Consciousness: awake, alert , oriented and patient cooperative  Airway & Oxygen Therapy: Patient Spontanous Breathing and Patient connected to nasal cannula oxygen  Post-op Assessment: Report given to RN, Post -op Vital signs reviewed and stable and Patient moving all extremities X 4  Post vital signs: Reviewed and stable  Last Vitals:  Filed Vitals:   01/22/16 1042  BP: 138/70  Pulse: 66  Temp: 35.7 C  Resp: 16    Complications: No apparent anesthesia complications

## 2016-01-22 NOTE — H&P (Signed)
  Primary Care Physician:  Arnette Norris, MD  Pre-Procedure History & Physical: HPI:  Annette Lee is a 80 y.o. female is here for an colonoscopy.   Past Medical History  Diagnosis Date  . Vaginal atrophy   . Hyperlipidemia   . Allergic rhinitis due to pollen   . Sleep disturbance   . Osteopenia 11/12    T -1.8 in hip    Past Surgical History  Procedure Laterality Date  . Appendectomy    . Abdominal hysterectomy    . Rotator cuff repair Right ~2001  . Cataract extraction w/ intraocular lens  implant, bilateral      Prior to Admission medications   Medication Sig Start Date End Date Taking? Authorizing Provider  ondansetron (ZOFRAN) 4 MG tablet Take 4 mg by mouth every 8 (eight) hours as needed for nausea or vomiting.   Yes Historical Provider, MD  simvastatin (ZOCOR) 10 MG tablet Take 10 mg by mouth every 7 (seven) days.    Historical Provider, MD    Allergies as of 01/05/2016 - Review Complete 12/17/2015  Allergen Reaction Noted  . Contrast media [iodinated diagnostic agents] Anaphylaxis 10/29/2013    Family History  Problem Relation Age of Onset  . Cancer Mother     CLL  . Cancer Father     colon  . Aneurysm Father   . Cancer Brother     colon cancer  . Heart disease Maternal Uncle   . Diabetes Neg Hx   . Breast cancer Neg Hx     Social History   Social History  . Marital Status: Married    Spouse Name: N/A  . Number of Children: 0  . Years of Education: N/A   Occupational History  . Artist/designer/substitute teacher     Retired   Social History Main Topics  . Smoking status: Former Research scientist (life sciences)  . Smokeless tobacco: Never Used  . Alcohol Use: Yes  . Drug Use: No  . Sexual Activity: Not on file   Other Topics Concern  . Not on file   Social History Narrative   Has living will   Husband is health care POA.   Would accept resuscitation but no prolonged ventilation   Not sure about tube feeds     Physical Exam: BP 138/70 mmHg  Pulse 66   Temp(Src) 96.3 F (35.7 C) (Tympanic)  Resp 16  Ht 5' 4.5" (1.638 m)  Wt 58.514 kg (129 lb)  BMI 21.81 kg/m2  SpO2 100% General:   Alert,  pleasant and cooperative in NAD Head:  Normocephalic and atraumatic. Neck:  Supple; no masses or thyromegaly. Lungs:  Clear throughout to auscultation.    Heart:  Regular rate and rhythm. Abdomen:  Soft, nontender and nondistended. Normal bowel sounds, without guarding, and without rebound.   Neurologic:  Alert and  oriented x4;  grossly normal neurologically.  Impression/Plan: Annette Lee is here for an colonoscopy to be performed for screening, + fam hx  Risks, benefits, limitations, and alternatives regarding  colonoscopy have been reviewed with the patient.  Questions have been answered.  All parties agreeable.   Josefine Class, MD  01/22/2016, 12:22 PM

## 2016-01-22 NOTE — Op Note (Signed)
[facility] Gastroenterology Patient Name: Annette Lee Procedure Date: 01/22/2016 12:23 PM MRN: [id number] Account #: [id number] Date of Birth: September 05, 1936 Admit Type: Outpatient Age: 80 Room: [facility] ENDO ROOM 4 Gender: Female Note Status: Finalized Procedure:            Colonoscopy Indications:          Colon cancer screening in patient at increased risk:                        Colorectal cancer in father, Colon cancer screening in                        patient at increased risk: Colorectal cancer in                        brother, Last colonoscopy: 2014 Patient Profile:      This is a 80 year old female. Providers:            Gerrit Heck. Rayann Heman, MD Referring MD:         Marciano Sequin. Deborra Medina, MD (Referring MD) Medicines:            Propofol per Anesthesia Complications:        No immediate complications. Procedure:            Pre-Anesthesia Assessment:                       - Prior to the procedure, a History and Physical was                        performed, and patient medications, allergies and                        sensitivities were reviewed. The patient's tolerance of                        previous anesthesia was reviewed.                       After obtaining informed consent, the colonoscope was                        passed under direct vision. Throughout the procedure,                        the patient's blood pressure, pulse, and oxygen                        saturations were monitored continuously. The                        Colonoscope was introduced through the anus and                        advanced to the the descending colon. The colonoscopy                        was extremely difficult due to restricted mobility of                        the colon. The patient tolerated the procedure  well.                        The quality of the bowel preparation was excellent. Findings:      The perianal exam findings include non-thrombosed external  hemorrhoids.      The colon (entire examined portion) appeared normal.      - Unable to advance past desc colon due to severe adhesion and       restricted mobility in the pelvis. Changed to upper endsocope which       could not be passed further than the desc colon. Impression:           - Non-thrombosed external hemorrhoids found on perianal                        exam.                       - The entire examined colon is normal.                       - No specimens collected.                       - Unable to advance past desc colon due to severe                        adhesion and restricted mobility in the pelvis. Changed                        to upper endsocope which could not be passed further                        than the desc colon. Recommendation:       - Observe patient in GI recovery unit.                       - High fiber diet.                       - Continue present medications.                       - Return to referring physician.                       - Consider no further colon cancer screening given                        severely restricted colon mobility in the pelvis.                       - The findings and recommendations were discussed with                        the patient.                       - The findings and recommendations were discussed with                        the patient's family. Procedure Code(s):    --- Professional ---  [id number], 53, Colonoscopy, flexible; diagnostic, including                        collection of specimen(s) by brushing or washing, when                        performed (separate procedure) Diagnosis Code(s):    --- Professional ---                       K64.4, Residual hemorrhoidal skin tags                       Z80.0, Family history of malignant neoplasm of                        digestive organs CPT copyright 2016 American Medical Association. All rights reserved. The codes documented in this  report are preliminary and upon coder review may  be revised to meet current compliance requirements. Mellody Life, MD 01/22/2016 1:00:54 PM This report has been signed electronically. Number of Addenda: 0 Note Initiated On: 01/22/2016 12:23 PM Total Procedure Duration: 0 hours 27 minutes 12 seconds       [facility]

## 2016-01-22 NOTE — Anesthesia Preprocedure Evaluation (Signed)
Anesthesia Evaluation  Patient identified by MRN, date of birth, ID band Patient awake    Reviewed: Allergy & Precautions, H&P , NPO status , Patient's Chart, lab work & pertinent test results, reviewed documented beta blocker date and time   Airway Mallampati: II   Neck ROM: full    Dental  (+) Teeth Intact   Pulmonary neg pulmonary ROS,    Pulmonary exam normal        Cardiovascular negative cardio ROS Normal cardiovascular exam Rhythm:regular Rate:Normal     Neuro/Psych negative neurological ROS  negative psych ROS   GI/Hepatic negative GI ROS, Neg liver ROS,   Endo/Other  negative endocrine ROS  Renal/GU negative Renal ROS  negative genitourinary   Musculoskeletal   Abdominal   Peds  Hematology negative hematology ROS (+)   Anesthesia Other Findings Past Medical History:   Vaginal atrophy                                              Hyperlipidemia                                               Allergic rhinitis due to pollen                              Sleep disturbance                                            Osteopenia                                      11/12          Comment:T -1.8 in hip Past Surgical History:   APPENDECTOMY                                                  ABDOMINAL HYSTERECTOMY                                        ROTATOR CUFF REPAIR                             Right ~2001        CATARACT EXTRACTION W/ INTRAOCULAR LENS  IMPLA*               Reproductive/Obstetrics negative OB ROS                             Anesthesia Physical Anesthesia Plan  ASA: II  Anesthesia Plan: General   Post-op Pain Management:    Induction:   Airway Management Planned:   Additional Equipment:   Intra-op Plan:   Post-operative Plan:  Informed Consent: I have reviewed the patients History and Physical, chart, labs and discussed the procedure including the risks,  benefits and alternatives for the proposed anesthesia with the patient or authorized representative who has indicated his/her understanding and acceptance.   Dental Advisory Given  Plan Discussed with: CRNA  Anesthesia Plan Comments:         Anesthesia Quick Evaluation

## 2016-01-23 NOTE — Anesthesia Postprocedure Evaluation (Signed)
Anesthesia Post Note  Patient: [employer]  Procedure(s) Performed: Procedure(s) (LRB): COLONOSCOPY WITH PROPOFOL (N/A)  Patient location during evaluation: PACU Anesthesia Type: General Level of consciousness: awake and alert Pain management: pain level controlled Vital Signs Assessment: post-procedure vital signs reviewed and stable Respiratory status: spontaneous breathing, nonlabored ventilation, respiratory function stable and patient connected to nasal cannula oxygen Cardiovascular status: blood pressure returned to baseline and stable Postop Assessment: no signs of nausea or vomiting Anesthetic complications: no    Last Vitals:  Filed Vitals:   01/22/16 1327 01/22/16 1337  BP: 119/68 108/65  Pulse: 65 67  Temp:    Resp: 21 17    Last Pain: There were no vitals filed for this visit.               Molli Barrows

## 2016-01-25 ENCOUNTER — Encounter: Payer: Self-pay | Admitting: Gastroenterology

## 2016-01-28 ENCOUNTER — Ambulatory Visit (INDEPENDENT_AMBULATORY_CARE_PROVIDER_SITE_OTHER): Payer: Medicare Other | Admitting: Family Medicine

## 2016-01-28 ENCOUNTER — Encounter: Payer: Self-pay | Admitting: Family Medicine

## 2016-01-28 VITALS — BP 130/62 | HR 64 | Temp 98.1°F | Wt 131.8 lb

## 2016-01-28 DIAGNOSIS — Z86010 Personal history of colonic polyps: Secondary | ICD-10-CM

## 2016-01-28 DIAGNOSIS — M542 Cervicalgia: Secondary | ICD-10-CM

## 2016-01-28 DIAGNOSIS — L601 Onycholysis: Secondary | ICD-10-CM

## 2016-01-28 MED ORDER — CICLOPIROX 8 % EX SOLN: Freq: Every day | CUTANEOUS | Status: DC

## 2016-01-28 NOTE — Progress Notes (Signed)
Pre visit review using our clinic review tool, if applicable. No additional management support is needed unless otherwise documented below in the visit note. 

## 2016-01-28 NOTE — Progress Notes (Signed)
Subjective:   Patient ID: Annette Lee, female    DOB: 10-17-1936, 80 y.o.   MRN: [id number]  Annette Lee is a pleasant 80 y.o. year old female who presents to clinic today with Nail Problem; Results; and Neck Pain  on 01/28/2016  HPI:  When I saw her on 12/17/15, she had complained of changes in her bowel habits.    Does have a h/o polyps and positive family h/o colon [place] (dad).  Previous colonoscopy (report scanned in Epic) was done in [place] on 01/16/13- advised 3 year recall. Denied any blood or mucous in her stools. No lower abdominal cramping.  I therefore referred her for another colonoscopy which was done on 01/22/16 by Dr. Rayann Heman. Results reviewed and she would like to discuss this today-  He was unable to advance past the descending colon due to severe adhesions and restricted mobility of the pelvis but it was otherwise normal.  No specimens were collected.   She was advised to follow up with me. Bowel habits have normalized.  Onychomyosis of toenail- failed OTC lamisil.  Current Outpatient Prescriptions on File Prior to Visit  Medication Sig Dispense Refill  . simvastatin (ZOCOR) 10 MG tablet Take 10 mg by mouth every 7 (seven) days.     No current facility-administered medications on file prior to visit.    Allergies  Allergen Reactions  . Contrast Media [Iodinated Diagnostic Agents] Anaphylaxis    Past Medical History  Diagnosis Date  . Vaginal atrophy   . Hyperlipidemia   . Allergic rhinitis due to pollen   . Sleep disturbance   . Osteopenia 11/12    T -1.8 in hip    Past Surgical History  Procedure Laterality Date  . Appendectomy    . Abdominal hysterectomy    . Rotator cuff repair Right ~2001  . Cataract extraction w/ intraocular lens  implant, bilateral    . Colonoscopy with propofol N/A 01/22/2016    Procedure: COLONOSCOPY WITH PROPOFOL;  Surgeon: Josefine Class, MD;  Location: [facility] ENDOSCOPY;  Service: Endoscopy;  Laterality: N/A;    Family  History  Problem Relation Age of Onset  . Cancer Mother     CLL  . Cancer Father     colon  . Aneurysm Father   . Cancer Brother     colon cancer  . Heart disease Maternal Uncle   . Diabetes Neg Hx   . Breast cancer Neg Hx     Social History   Social History  . Marital Status: Married    Spouse Name: N/A  . Number of Children: 0  . Years of Education: N/A   Occupational History  . Artist/designer/substitute teacher     Retired   Social History Main Topics  . Smoking status: Former Research scientist (life sciences)  . Smokeless tobacco: Never Used  . Alcohol Use: Yes  . Drug Use: No  . Sexual Activity: Not on file   Other Topics Concern  . Not on file   Social History Narrative   Has living will   Husband is health care POA.   Would accept resuscitation but no prolonged ventilation   Not sure about tube feeds   The PMH, PSH, Social History, Family History, Medications, and allergies have been reviewed in [facility], and have been updated if relevant.   Review of Systems  Constitutional: Negative.   Gastrointestinal: Negative for nausea, vomiting, abdominal pain, diarrhea, constipation, blood in stool, abdominal distention, anal bleeding and rectal pain.  Genitourinary: Negative.  Musculoskeletal: Negative for back pain and gait problem.  Neurological: Negative.   All other systems reviewed and are negative.      Objective:    BP 130/62 mmHg  Pulse 64  Temp(Src) 98.1 F (36.7 C) (Oral)  Wt 131 lb 12 oz (59.761 kg)  SpO2 99%   Physical Exam  Constitutional: She is oriented to person, place, and time. She appears well-developed and well-nourished. No distress.  HENT:  Head: Normocephalic.  Eyes: Conjunctivae are normal.  Neck: Normal range of motion.  Cardiovascular: Normal rate and regular rhythm.   Pulmonary/Chest: Effort normal and breath sounds normal.  Abdominal: Soft. Bowel sounds are normal. She exhibits no distension and no mass. There is no tenderness. There is no rebound  and no guarding.  Musculoskeletal: Normal range of motion.       Lumbar back: Normal. She exhibits normal range of motion, no tenderness, no bony tenderness, no swelling, no edema, no deformity, no laceration, no pain, no spasm and normal pulse.  Neurological: She is alert and oriented to person, place, and time.  Skin: Skin is warm and dry. She is not diaphoretic.     Psychiatric: She has a normal mood and affect. Her behavior is normal. Judgment and thought content normal.  Nursing note and vitals reviewed.         Assessment & Plan:   Onycholysis of toenail  History of colonic polyps  Neck pain No Follow-up on file.

## 2016-01-28 NOTE — Assessment & Plan Note (Signed)
>  25 minutes spent in face to face time with patient, >50% spent in counselling or coordination of care Also has a family history of colon CA.  She is asking what she should do now that colonoscopy was not successful. We agreed to send off for cologuard if positive, consider imaging.

## 2016-01-28 NOTE — Assessment & Plan Note (Signed)
Failed lamisil. Rx given for ciclopirox. Call or return to clinic prn if these symptoms worsen or fail to improve as anticipated. The patient indicates understanding of these issues and agrees with the plan.

## 2016-02-03 DIAGNOSIS — Z1212 Encounter for screening for malignant neoplasm of rectum: Secondary | ICD-10-CM | POA: Diagnosis not present

## 2016-02-03 DIAGNOSIS — Z1211 Encounter for screening for malignant neoplasm of colon: Secondary | ICD-10-CM | POA: Diagnosis not present

## 2016-02-03 LAB — COLOGUARD: COLOGUARD: NEGATIVE

## 2016-02-15 ENCOUNTER — Ambulatory Visit (INDEPENDENT_AMBULATORY_CARE_PROVIDER_SITE_OTHER): Payer: Medicare Other | Admitting: Family Medicine

## 2016-02-15 ENCOUNTER — Encounter: Payer: Self-pay | Admitting: Family Medicine

## 2016-02-15 ENCOUNTER — Encounter: Payer: Self-pay | Admitting: *Deleted

## 2016-02-15 VITALS — BP 130/68 | HR 61 | Temp 97.7°F | Wt 131.8 lb

## 2016-02-15 DIAGNOSIS — J0111 Acute recurrent frontal sinusitis: Secondary | ICD-10-CM | POA: Diagnosis not present

## 2016-02-15 MED ORDER — AMOXICILLIN-POT CLAVULANATE 875-125 MG PO TABS: 1.0000 | ORAL_TABLET | Freq: Two times a day (BID) | ORAL | Status: AC

## 2016-02-15 NOTE — Patient Instructions (Signed)
Take antibiotic as directed- Augmentin 1 tablet twice daily for 10 days.  Drink lots of fluids.    Treat sympotmatically with Mucinex, nasal saline irrigation, and Tylenol/Ibuprofen.   Also try an antihistamine/decongestant like claritin D or zyrtec D over the counter- two times a day as needed ( have to sign for them at pharmacy).   Try over the counter nasocort-start with 2 sprays per nostril per day...and then try to taper to 1 spray per nostril once symptoms improve.   You can use warm compresses.    Call if not improving as expected in 5-7 days.

## 2016-02-15 NOTE — Progress Notes (Signed)
SUBJECTIVE:  Annette Lee is a 80 y.o. female who complains of coryza, congestion, sneezing and bilateral sinus pain for 10 days. She denies a history of anorexia and chest pain and denies a history of asthma. Patient denies smoke cigarettes.   Current Outpatient Prescriptions on File Prior to Visit  Medication Sig Dispense Refill  . ciclopirox (PENLAC) 8 % solution Apply topically at bedtime. Apply over nail and surrounding skin. Apply daily over previous coat. After seven (7) days, may remove with alcohol and continue cycle. 6.6 mL 0  . simvastatin (ZOCOR) 10 MG tablet Take 10 mg by mouth every 7 (seven) days.     No current facility-administered medications on file prior to visit.    Allergies  Allergen Reactions  . Contrast Media [Iodinated Diagnostic Agents] Anaphylaxis    Past Medical History  Diagnosis Date  . Vaginal atrophy   . Hyperlipidemia   . Allergic rhinitis due to pollen   . Sleep disturbance   . Osteopenia 11/12    T -1.8 in hip    Past Surgical History  Procedure Laterality Date  . Appendectomy    . Abdominal hysterectomy    . Rotator cuff repair Right ~2001  . Cataract extraction w/ intraocular lens  implant, bilateral    . Colonoscopy with propofol N/A 01/22/2016    Procedure: COLONOSCOPY WITH PROPOFOL;  Surgeon: Josefine Class, MD;  Location: [facility] ENDOSCOPY;  Service: Endoscopy;  Laterality: N/A;    Family History  Problem Relation Age of Onset  . Cancer Mother     CLL  . Cancer Father     colon  . Aneurysm Father   . Cancer Brother     colon cancer  . Heart disease Maternal Uncle   . Diabetes Neg Hx   . Breast cancer Neg Hx     Social History   Social History  . Marital Status: Married    Spouse Name: N/A  . Number of Children: 0  . Years of Education: N/A   Occupational History  . Artist/designer/substitute teacher     Retired   Social History Main Topics  . Smoking status: Former Research scientist (life sciences)  . Smokeless tobacco: Never Used  .  Alcohol Use: Yes  . Drug Use: No  . Sexual Activity: Not on file   Other Topics Concern  . Not on file   Social History Narrative   Has living will   Husband is health care POA.   Would accept resuscitation but no prolonged ventilation   Not sure about tube feeds   The PMH, PSH, Social History, Family History, Medications, and allergies have been reviewed in [facility], and have been updated if relevant.  OBJECTIVE: BP 130/68 mmHg  Pulse 61  Temp(Src) 97.7 F (36.5 C) (Oral)  Wt 131 lb 12.8 oz (59.784 kg)  SpO2 99%  She appears well, vital signs are as noted. Ears normal.  Throat and pharynx normal.  Neck supple. No adenopathy in the neck. Nose is congested. Sinuses non tender. The chest is clear, without wheezes or rales.  ASSESSMENT:  sinusitis and allergic rhinitis  PLAN: Given duration and progression of symptoms, will treat for bacterial sinusitis.   Symptomatic therapy suggested: push fluids, rest and return office visit prn if symptoms persist or worsen.Call or return to clinic prn if these symptoms worsen or fail to improve as anticipated.

## 2016-02-15 NOTE — Progress Notes (Signed)
Pre visit review using our clinic review tool, if applicable. No additional management support is needed unless otherwise documented below in the visit note. 

## 2016-04-06 DIAGNOSIS — I6522 Occlusion and stenosis of left carotid artery: Secondary | ICD-10-CM | POA: Diagnosis not present

## 2016-04-27 DIAGNOSIS — R0989 Other specified symptoms and signs involving the circulatory and respiratory systems: Secondary | ICD-10-CM | POA: Diagnosis not present

## 2016-04-27 DIAGNOSIS — I2510 Atherosclerotic heart disease of native coronary artery without angina pectoris: Secondary | ICD-10-CM | POA: Diagnosis not present

## 2016-06-22 ENCOUNTER — Other Ambulatory Visit: Payer: Self-pay | Admitting: Family Medicine

## 2016-06-22 DIAGNOSIS — E785 Hyperlipidemia, unspecified: Secondary | ICD-10-CM

## 2016-06-22 DIAGNOSIS — Z01419 Encounter for gynecological examination (general) (routine) without abnormal findings: Secondary | ICD-10-CM

## 2016-06-29 ENCOUNTER — Ambulatory Visit (INDEPENDENT_AMBULATORY_CARE_PROVIDER_SITE_OTHER): Payer: Medicare Other

## 2016-06-29 ENCOUNTER — Other Ambulatory Visit (INDEPENDENT_AMBULATORY_CARE_PROVIDER_SITE_OTHER): Payer: Medicare Other

## 2016-06-29 VITALS — BP 122/72 | HR 67 | Temp 97.7°F | Ht 64.0 in | Wt 127.5 lb

## 2016-06-29 DIAGNOSIS — E785 Hyperlipidemia, unspecified: Secondary | ICD-10-CM | POA: Diagnosis not present

## 2016-06-29 DIAGNOSIS — Z23 Encounter for immunization: Secondary | ICD-10-CM | POA: Diagnosis not present

## 2016-06-29 DIAGNOSIS — Z0000 Encounter for general adult medical examination without abnormal findings: Secondary | ICD-10-CM

## 2016-06-29 DIAGNOSIS — Z01419 Encounter for gynecological examination (general) (routine) without abnormal findings: Secondary | ICD-10-CM

## 2016-06-29 LAB — COMPREHENSIVE METABOLIC PANEL
ALT: 13 U/L (ref 0–35)
AST: 21 U/L (ref 0–37)
Albumin: 4.3 g/dL (ref 3.5–5.2)
Alkaline Phosphatase: 57 U/L (ref 39–117)
BUN: 14 mg/dL (ref 6–23)
CO2: 31 meq/L (ref 19–32)
Calcium: 9.7 mg/dL (ref 8.4–10.5)
Chloride: 105 mEq/L (ref 96–112)
Creatinine, Ser: 0.67 mg/dL (ref 0.40–1.20)
GFR: 90.46 mL/min (ref >60.00)
GLUCOSE: 81 mg/dL (ref 70–99)
POTASSIUM: 4 meq/L (ref 3.5–5.1)
Sodium: 139 mEq/L (ref 135–145)
Total Bilirubin: 0.6 mg/dL (ref 0.2–1.2)
Total Protein: 7.2 g/dL (ref 6.0–8.3)

## 2016-06-29 LAB — CBC WITH DIFFERENTIAL/PLATELET
BASOS ABS: 0 10*3/uL (ref 0.0–0.1)
Basophils Relative: 0.5 % (ref 0.0–3.0)
Eosinophils Absolute: 0.1 10*3/uL (ref 0.0–0.7)
Eosinophils Relative: 1.7 % (ref 0.0–5.0)
HCT: 44.8 % (ref 36.0–46.0)
Hemoglobin: 15.1 g/dL — ABNORMAL HIGH (ref 12.0–15.0)
LYMPHS ABS: 1.7 10*3/uL (ref 0.7–4.0)
Lymphocytes Relative: 20.8 % (ref 12.0–46.0)
MCHC: 33.6 g/dL (ref 30.0–36.0)
MCV: 91.4 fl (ref 78.0–100.0)
MONOS PCT: 8 % (ref 3.0–12.0)
Monocytes Absolute: 0.6 10*3/uL (ref 0.1–1.0)
NEUTROS ABS: 5.5 10*3/uL (ref 1.4–7.7)
NEUTROS PCT: 69 % (ref 43.0–77.0)
Platelets: 192 10*3/uL (ref 150.0–400.0)
RBC: 4.91 Mil/uL (ref 3.87–5.11)
RDW: 13.1 % (ref 11.5–15.5)
WBC: 8 10*3/uL (ref 4.0–10.5)

## 2016-06-29 LAB — LIPID PANEL
CHOL/HDL RATIO: 3
Cholesterol: 194 mg/dL (ref 0–200)
HDL: 59.4 mg/dL (ref >39.00)
LDL CALC: 118 mg/dL — AB (ref 0–99)
NONHDL: 134.48
Triglycerides: 84 mg/dL (ref 0.0–149.0)
VLDL: 16.8 mg/dL (ref 0.0–40.0)

## 2016-06-29 NOTE — Patient Instructions (Signed)
Annette Lee , Thank you for taking time to come for your Medicare Wellness Visit. I appreciate your ongoing commitment to your health goals. Please review the following plan we discussed and let me know if I can assist you in the future.   These are the goals we discussed: Goals    . Increase physical activity          Starting 06/29/2016, I will continue to exercise for at least 30-60 min 4-5 days per week.        This is a list of the screening recommended for you and due dates:  Health Maintenance  Topic Date Due  . Tetanus Vaccine  07/06/2016*  . Flu Shot  Completed  . DEXA scan (bone density measurement)  Completed  . Shingles Vaccine  Completed  . Pneumonia vaccines  Completed  *Topic was postponed. The date shown is not the original due date.   Preventive Care for Adults  A healthy lifestyle and preventive care can promote health and wellness. Preventive health guidelines for adults include the following key practices.  . A routine yearly physical is a good way to check with your health care provider about your health and preventive screening. It is a chance to share any concerns and updates on your health and to receive a thorough exam.  . Visit your dentist for a routine exam and preventive care every 6 months. Brush your teeth twice a day and floss once a day. Good oral hygiene prevents tooth decay and gum disease.  . The frequency of eye exams is based on your age, health, family medical history, use  of contact lenses, and other factors. Follow your health care provider's ecommendations for frequency of eye exams.  . Eat a healthy diet. Foods like vegetables, fruits, whole grains, low-fat dairy products, and lean protein foods contain the nutrients you need without too many calories. Decrease your intake of foods high in solid fats, added sugars, and salt. Eat the right amount of calories for you. Get information about a proper diet from your health care provider, if  necessary.  . Regular physical exercise is one of the most important things you can do for your health. Most adults should get at least 150 minutes of moderate-intensity exercise (any activity that increases your heart rate and causes you to sweat) each week. In addition, most adults need muscle-strengthening exercises on 2 or more days a week.  Silver Sneakers may be a benefit available to you. To determine eligibility, you may visit the website: www.silversneakers.com or contact program at (757) 356-2052 Mon-Fri between 8AM-8PM.   . Maintain a healthy weight. The body mass index (BMI) is a screening tool to identify possible weight problems. It provides an estimate of body fat based on height and weight. Your health care provider can find your BMI and can help you achieve or maintain a healthy weight.   For adults 20 years and older: ? A BMI below 18.5 is considered underweight. ? A BMI of 18.5 to 24.9 is normal. ? A BMI of 25 to 29.9 is considered overweight. ? A BMI of 30 and above is considered obese.   . Maintain normal blood lipids and cholesterol levels by exercising and minimizing your intake of saturated fat. Eat a balanced diet with plenty of fruit and vegetables. Blood tests for lipids and cholesterol should begin at age 12 and be repeated every 5 years. If your lipid or cholesterol levels are high, you are over 50, or you  are at high risk for heart disease, you may need your cholesterol levels checked more frequently. Ongoing high lipid and cholesterol levels should be treated with medicines if diet and exercise are not working.  . If you smoke, find out from your health care provider how to quit. If you do not use tobacco, please do not start.  . If you choose to drink alcohol, please do not consume more than 2 drinks per day. One drink is considered to be 12 ounces (355 mL) of beer, 5 ounces (148 mL) of wine, or 1.5 ounces (44 mL) of liquor.  . If you are 66-51 years old, ask your  health care provider if you should take aspirin to prevent strokes.  . Use sunscreen. Apply sunscreen liberally and repeatedly throughout the day. You should seek shade when your shadow is shorter than you. Protect yourself by wearing long sleeves, pants, a wide-brimmed hat, and sunglasses year round, whenever you are outdoors.  . Once a month, do a whole body skin exam, using a mirror to look at the skin on your back. Tell your health care provider of new moles, moles that have irregular borders, moles that are larger than a pencil eraser, or moles that have changed in shape or color.

## 2016-06-29 NOTE — Progress Notes (Signed)
Pre visit review using our clinic review tool, if applicable. No additional management support is needed unless otherwise documented below in the visit note. 

## 2016-06-29 NOTE — Progress Notes (Signed)
PCP notes:   Health maintenance:  Flu vaccine - administered Tetanus - pt will get vaccine at CPE  Abnormal screenings:   Hearing - failed  Patient concerns:   None  Nurse concerns:  None  Next PCP appt:   07/06/16 @ 0915

## 2016-06-29 NOTE — Progress Notes (Signed)
Subjective:   Annette Lee is a 80 y.o. female who presents for Medicare Annual (Subsequent) preventive examination.  Review of Systems:  N/A Cardiac Risk Factors include: advanced age (>33men, >98 women);dyslipidemia     Objective:     Vitals: BP 122/72 (BP Location: Left Arm, Patient Position: Sitting, Cuff Size: Normal)   Pulse 67   Temp 97.7 F (36.5 C) (Oral)   Ht 5\' 4"  (1.626 m) Comment: no shoes  Wt 127 lb 8 oz (57.8 kg)   SpO2 97%   BMI 21.89 kg/m   Body mass index is 21.89 kg/m.   Tobacco History  Smoking Status  . Former Smoker  Smokeless Tobacco  . Never Used     Counseling given: No   Past Medical History:  Diagnosis Date  . Allergic rhinitis due to pollen   . Hyperlipidemia   . Osteopenia 11/12   T -1.8 in hip  . Sleep disturbance   . Vaginal atrophy    Past Surgical History:  Procedure Laterality Date  . ABDOMINAL HYSTERECTOMY    . APPENDECTOMY    . CATARACT EXTRACTION W/ INTRAOCULAR LENS  IMPLANT, BILATERAL    . COLONOSCOPY WITH PROPOFOL N/A 01/22/2016   Procedure: COLONOSCOPY WITH PROPOFOL;  Surgeon: Josefine Class, MD;  Location: [facility] ENDOSCOPY;  Service: Endoscopy;  Laterality: N/A;  . ROTATOR CUFF REPAIR Right ~2001   Family History  Problem Relation Age of Onset  . Cancer Mother     CLL  . Cancer Father     colon  . Aneurysm Father   . Cancer Brother     colon cancer  . Heart disease Maternal Uncle   . Diabetes Neg Hx   . Breast cancer Neg Hx    History  Sexual Activity  . Sexual activity: No    Outpatient Encounter Prescriptions as of 06/29/2016  Medication Sig  . ciclopirox (PENLAC) 8 % solution Apply topically at bedtime. Apply over nail and surrounding skin. Apply daily over previous coat. After seven (7) days, may remove with alcohol and continue cycle.  Marland Kitchen oxymetazoline (AFRIN) 0.05 % nasal spray Place 1 spray into both nostrils 2 (two) times daily as needed.   . simvastatin (ZOCOR) 10 MG tablet Take 10 mg by  mouth every 7 (seven) days.   No facility-administered encounter medications on file as of 06/29/2016.     Activities of Daily Living In your present state of health, do you have any difficulty performing the following activities: 06/29/2016  Hearing? Y  Vision? N  Difficulty concentrating or making decisions? N  Walking or climbing stairs? N  Dressing or bathing? N  Doing errands, shopping? N  Preparing Food and eating ? N  Using the Toilet? N  In the past six months, have you accidently leaked urine? N  Do you have problems with loss of bowel control? N  Managing your Medications? N  Managing your Finances? N  Housekeeping or managing your Housekeeping? N  Some recent data might be hidden    Patient Care Team: Lucille Passy, MD as PCP - General (Family Medicine) Art Esperanza Sheets, MD as Consulting Physician (General Practice)    Assessment:     Hearing Screening   125Hz  250Hz  500Hz  1000Hz  2000Hz  3000Hz  4000Hz  6000Hz  8000Hz   Right ear:   0 0 40  40    Left ear:   0 0 40  40    Vision Screening Comments: Last vision exam with Dr. Zadie Rhine on 12/03/15   Exercise  Activities and Dietary recommendations Current Exercise Habits: Home exercise routine, Type of exercise: walking;Other - see comments (swimming), Time (Minutes): 45, Frequency (Times/Week): 5, Weekly Exercise (Minutes/Week): 225, Intensity: Moderate, Exercise limited by: None identified  Goals    . Increase physical activity          Starting 06/29/2016, I will continue to exercise for at least 30-60 min 4-5 days per week.       Fall Risk Fall Risk  06/29/2016 07/13/2015 07/08/2014 07/08/2014  Falls in the past year? No No No No   Depression Screen PHQ 2/9 Scores 06/29/2016 07/13/2015 07/08/2014 07/08/2014  PHQ - 2 Score 0 0 0 0     Cognitive Testing MMSE - Mini Mental State Exam 06/29/2016  Orientation to time 5  Orientation to Place 5  Registration 3  Attention/ Calculation 0  Recall 3  Language- name 2 objects 0    Language- repeat 1  Language- follow 3 step command 3  Language- read & follow direction 0  Write a sentence 0  Copy design 0  Total score 20   PLEASE NOTE: A Mini-Cog screen was completed. Maximum score is 20. A value of 0 denotes this part of Folstein MMSE was not completed or the patient failed this part of the Mini-Cog screening.   Mini-Cog Screening Orientation to Time - Max 5 pts Orientation to Place - Max 5 pts Registration - Max 3 pts Recall - Max 3 pts Language Repeat - Max 1 pts Language Follow 3 Step Command - Max 3 pts  Immunization History  Administered Date(s) Administered  . Influenza Split 08/02/2013  . Influenza,inj,Quad PF,36+ Mos 07/08/2014, 07/13/2015  . Pneumococcal Conjugate-13 09/11/2014  . Pneumococcal Polysaccharide-23 09/10/2013  . Zoster 09/11/2014   Screening Tests Health Maintenance  Topic Date Due  . TETANUS/TDAP  07/06/2016 (Originally 06/12/1955)  . INFLUENZA VACCINE  Completed  . DEXA SCAN  Completed  . ZOSTAVAX  Completed  . PNA vac Low Risk Adult  Completed      Plan:     I have personally reviewed and addressed the Medicare Annual Wellness questionnaire and have noted the following in the patient's chart:  A. Medical and social history B. Use of alcohol, tobacco or illicit drugs  C. Current medications and supplements D. Functional ability and status E.  Nutritional status F.  Physical activity G. Advance directives H. List of other physicians I.  Hospitalizations, surgeries, and ER visits in previous 12 months J.  [place] to include hearing, vision, cognitive, depression L. Referrals and appointments - none  In addition, I have reviewed and discussed with patient certain preventive protocols, quality metrics, and best practice recommendations. A written personalized care plan for preventive services as well as general preventive health recommendations were provided to patient.  See attached scanned questionnaire  for additional information.   Signed,   Lindell Noe, MHA, BS, LPN Health Advisor

## 2016-06-30 LAB — TSH: TSH: 1.9 u[IU]/mL (ref 0.35–4.50)

## 2016-06-30 NOTE — Progress Notes (Signed)
I reviewed health advisor's note, was available for consultation, and agree with documentation and plan.   Signed,  Conn Trombetta T. Jacier Gladu, MD  

## 2016-07-06 ENCOUNTER — Encounter: Payer: Medicare Other | Admitting: Family Medicine

## 2016-07-11 ENCOUNTER — Ambulatory Visit (INDEPENDENT_AMBULATORY_CARE_PROVIDER_SITE_OTHER): Payer: Medicare Other | Admitting: Family Medicine

## 2016-07-11 ENCOUNTER — Encounter: Payer: Self-pay | Admitting: Family Medicine

## 2016-07-11 VITALS — BP 142/72 | HR 72 | Temp 98.5°F | Ht 63.75 in | Wt 129.8 lb

## 2016-07-11 DIAGNOSIS — E785 Hyperlipidemia, unspecified: Secondary | ICD-10-CM

## 2016-07-11 DIAGNOSIS — G479 Sleep disorder, unspecified: Secondary | ICD-10-CM | POA: Diagnosis not present

## 2016-07-11 DIAGNOSIS — J301 Allergic rhinitis due to pollen: Secondary | ICD-10-CM | POA: Diagnosis not present

## 2016-07-11 DIAGNOSIS — Z86010 Personal history of colonic polyps: Secondary | ICD-10-CM | POA: Diagnosis not present

## 2016-07-11 DIAGNOSIS — Z08 Encounter for follow-up examination after completed treatment for malignant neoplasm: Secondary | ICD-10-CM

## 2016-07-11 DIAGNOSIS — Z85828 Personal history of other malignant neoplasm of skin: Secondary | ICD-10-CM

## 2016-07-11 NOTE — Assessment & Plan Note (Signed)
Well controlled on current dose of statin. No changes made to rx today.

## 2016-07-11 NOTE — Patient Instructions (Signed)
Great to see you.  Please let me know if you need a referral to see an allergist.

## 2016-07-11 NOTE — Assessment & Plan Note (Signed)
Well controlled with prn lunesta.

## 2016-07-11 NOTE — Addendum Note (Signed)
Addended by: Lucille Passy on: 07/11/2016 10:29 AM   Modules accepted: Orders

## 2016-07-11 NOTE — Assessment & Plan Note (Signed)
Colonoscopy UTD. 

## 2016-07-11 NOTE — Progress Notes (Signed)
Pre visit review using our clinic review tool, if applicable. No additional management support is needed unless otherwise documented below in the visit note. 

## 2016-07-11 NOTE — Progress Notes (Signed)
Subjective:   Patient ID: Annette Lee, female    DOB: 1936-07-24, 80 y.o.   MRN: [id number]  Annette Lee is a pleasant 80 y.o. year old female, whom I have no seen since she established care last year at her wellness visit,  presents to clinic today to establish care and for No chief complaint on file.  on 07/11/2016  HPI:  Annual wellness visit with Candis Musa, RN on 06/29/16.  Notes reviewed.  Influenza vaccine 06/29/16 Pneumovax 09/10/13 Prevnar 13 09/11/14 Mammogram 07/21/15 Colonoscopy 01/22/16 Zoster 09/11/14 Remote history of hysterectomy   HLD- has been taking Simvastatin 10 mg weekly.  Lab Results  Component Value Date   CHOL 194 06/29/2016   HDL 59.40 06/29/2016   LDLCALC 118 (H) 06/29/2016   TRIG 84.0 06/29/2016   CHOLHDL 3 06/29/2016    Insomnia- Lunesta. Does not take it nightly.  Lab Results  Component Value Date   TSH 1.90 06/29/2016   Lab Results  Component Value Date   WBC 8.0 06/29/2016   HGB 15.1 (H) 06/29/2016   HCT 44.8 06/29/2016   MCV 91.4 06/29/2016   PLT 192.0 06/29/2016   Lab Results  Component Value Date   NA 139 06/29/2016   K 4.0 06/29/2016   CL 105 06/29/2016   CO2 31 06/29/2016   Lab Results  Component Value Date   CREATININE 0.67 06/29/2016   Lab Results  Component Value Date   ALT 13 06/29/2016   AST 21 06/29/2016   ALKPHOS 57 06/29/2016   BILITOT 0.6 06/29/2016    Allergic rhinitis- has fall allergies.  Taking OTC antihistamine.  Current Outpatient Prescriptions on File Prior to Visit  Medication Sig Dispense Refill  . ciclopirox (PENLAC) 8 % solution Apply topically at bedtime. Apply over nail and surrounding skin. Apply daily over previous coat. After seven (7) days, may remove with alcohol and continue cycle. 6.6 mL 0  . oxymetazoline (AFRIN) 0.05 % nasal spray Place 1 spray into both nostrils 2 (two) times daily as needed.     . simvastatin (ZOCOR) 10 MG tablet Take 10 mg by mouth every 7 (seven) days.       No current facility-administered medications on file prior to visit.     Allergies  Allergen Reactions  . Contrast Media [Iodinated Diagnostic Agents] Anaphylaxis    Past Medical History:  Diagnosis Date  . Allergic rhinitis due to pollen   . Hyperlipidemia   . Osteopenia 11/12   T -1.8 in hip  . Sleep disturbance   . Vaginal atrophy     Past Surgical History:  Procedure Laterality Date  . ABDOMINAL HYSTERECTOMY    . APPENDECTOMY    . CATARACT EXTRACTION W/ INTRAOCULAR LENS  IMPLANT, BILATERAL    . COLONOSCOPY WITH PROPOFOL N/A 01/22/2016   Procedure: COLONOSCOPY WITH PROPOFOL;  Surgeon: Josefine Class, MD;  Location: [facility] ENDOSCOPY;  Service: Endoscopy;  Laterality: N/A;  . ROTATOR CUFF REPAIR Right ~2001    Family History  Problem Relation Age of Onset  . Cancer Mother     CLL  . Cancer Father     colon  . Aneurysm Father   . Cancer Brother     colon cancer  . Heart disease Maternal Uncle   . Diabetes Neg Hx   . Breast cancer Neg Hx     Social History   Social History  . Marital status: Married    Spouse name: N/A  . Number of children: 0  .  Years of education: N/A   Occupational History  . Artist/designer/substitute teacher     Retired   Social History Main Topics  . Smoking status: Former Research scientist (life sciences)  . Smokeless tobacco: Never Used  . Alcohol use Yes  . Drug use: No  . Sexual activity: No   Other Topics Concern  . Not on file   Social History Narrative   Has living will   Husband is health care POA.   Would accept resuscitation but no prolonged ventilation   Not sure about tube feeds   The PMH, PSH, Social History, Family History, Medications, and allergies have been reviewed in [facility], and have been updated if relevant.    Review of Systems  Constitutional: Negative.   HENT: Negative.   Eyes: Negative.   Respiratory: Negative.   Cardiovascular: Negative.   Gastrointestinal: Negative.   Endocrine: Negative.   Genitourinary:  Negative.   Musculoskeletal: Negative.   Skin: Negative.   Allergic/Immunologic: Negative.   Neurological: Negative.   Hematological: Negative.   Psychiatric/Behavioral: Negative.   All other systems reviewed and are negative.        Objective:    There were no vitals taken for this visit.   Physical Exam  Constitutional: She is oriented to person, place, and time. She appears well-developed and well-nourished. No distress.  HENT:  Head: Normocephalic.  Eyes: Pupils are equal, round, and reactive to light.  Neck: Normal range of motion. Neck supple. No thyromegaly present.  Cardiovascular: Normal rate, regular rhythm and normal heart sounds.   Pulmonary/Chest: Effort normal and breath sounds normal. No respiratory distress. She has no wheezes.  Abdominal: Soft. Bowel sounds are normal. She exhibits no distension. There is no tenderness.  Musculoskeletal: Normal range of motion.  Neurological: She is alert and oriented to person, place, and time. No cranial nerve deficit.  Skin: Skin is warm and dry.  Psychiatric: She has a normal mood and affect. Her behavior is normal. Judgment and thought content normal.  Nursing note and vitals reviewed.         Assessment & Plan:   Hyperlipidemia  Sleep disturbance  History of colonic polyps  Seasonal allergic rhinitis due to pollen No Follow-up on file.

## 2016-08-16 DIAGNOSIS — L821 Other seborrheic keratosis: Secondary | ICD-10-CM | POA: Diagnosis not present

## 2016-08-16 DIAGNOSIS — D2371 Other benign neoplasm of skin of right lower limb, including hip: Secondary | ICD-10-CM | POA: Diagnosis not present

## 2016-08-16 DIAGNOSIS — D1801 Hemangioma of skin and subcutaneous tissue: Secondary | ICD-10-CM | POA: Diagnosis not present

## 2016-09-04 ENCOUNTER — Encounter: Payer: Self-pay | Admitting: Family Medicine

## 2016-11-28 ENCOUNTER — Encounter: Payer: Self-pay | Admitting: Family Medicine

## 2016-11-28 ENCOUNTER — Ambulatory Visit (INDEPENDENT_AMBULATORY_CARE_PROVIDER_SITE_OTHER): Payer: Medicare Other | Admitting: Family Medicine

## 2016-11-28 VITALS — BP 128/68 | HR 75 | Temp 98.3°F | Wt 132.8 lb

## 2016-11-28 DIAGNOSIS — Z1283 Encounter for screening for malignant neoplasm of skin: Secondary | ICD-10-CM

## 2016-11-28 DIAGNOSIS — J301 Allergic rhinitis due to pollen: Secondary | ICD-10-CM | POA: Diagnosis not present

## 2016-11-28 MED ORDER — FLUTICASONE PROPIONATE 50 MCG/ACT NA SUSP: 2.0000 | Freq: Every day | NASAL | 2 refills | Status: DC

## 2016-11-28 NOTE — Patient Instructions (Signed)
Great to see you.   We are starting flonase daily. Add Claritin or Allegra as well.

## 2016-11-28 NOTE — Progress Notes (Signed)
Pre visit review using our clinic review tool, if applicable. No additional management support is needed unless otherwise documented below in the visit note. 

## 2016-11-28 NOTE — Progress Notes (Signed)
SUBJECTIVE:  Annette Lee is a 81 y.o. female who complains of coryza, congestion, sneezing and sore throat for several months. She denies a history of anorexia, chest pain, chills, dizziness and fevers and denies a history of asthma. Patient denies smoke cigarettes.   Current Outpatient Prescriptions on File Prior to Visit  Medication Sig Dispense Refill  . ciclopirox (PENLAC) 8 % solution Apply topically at bedtime. Apply over nail and surrounding skin. Apply daily over previous coat. After seven (7) days, may remove with alcohol and continue cycle. 6.6 mL 0  . oxymetazoline (AFRIN) 0.05 % nasal spray Place 1 spray into both nostrils 2 (two) times daily as needed.     . simvastatin (ZOCOR) 10 MG tablet Take 10 mg by mouth every 7 (seven) days.     No current facility-administered medications on file prior to visit.     Allergies  Allergen Reactions  . Contrast Media [Iodinated Diagnostic Agents] Anaphylaxis    Past Medical History:  Diagnosis Date  . Allergic rhinitis due to pollen   . Hyperlipidemia   . Osteopenia 11/12   T -1.8 in hip  . Sleep disturbance   . Vaginal atrophy     Past Surgical History:  Procedure Laterality Date  . ABDOMINAL HYSTERECTOMY    . APPENDECTOMY    . CATARACT EXTRACTION W/ INTRAOCULAR LENS  IMPLANT, BILATERAL    . COLONOSCOPY WITH PROPOFOL N/A 01/22/2016   Procedure: COLONOSCOPY WITH PROPOFOL;  Surgeon: Josefine Class, MD;  Location: [facility] ENDOSCOPY;  Service: Endoscopy;  Laterality: N/A;  . ROTATOR CUFF REPAIR Right ~2001    Family History  Problem Relation Age of Onset  . Cancer Mother     CLL  . Cancer Father     colon  . Aneurysm Father   . Cancer Brother     colon cancer  . Heart disease Maternal Uncle   . Diabetes Neg Hx   . Breast cancer Neg Hx     Social History   Social History  . Marital status: Married    Spouse name: N/A  . Number of children: 0  . Years of education: N/A   Occupational History  .  Artist/designer/substitute teacher     Retired   Social History Main Topics  . Smoking status: Former Research scientist (life sciences)  . Smokeless tobacco: Never Used  . Alcohol use Yes  . Drug use: No  . Sexual activity: No   Other Topics Concern  . Not on file   Social History Narrative   Has living will   Husband is health care POA.   Would accept resuscitation but no prolonged ventilation   Not sure about tube feeds   The PMH, PSH, Social History, Family History, Medications, and allergies have been reviewed in [facility], and have been updated if relevant.  OBJECTIVE: BP 128/68   Pulse 75   Temp 98.3 F (36.8 C) (Oral)   Wt 132 lb 12 oz (60.2 kg)   SpO2 96%   BMI 22.97 kg/m   She appears well, vital signs are as noted. Ears normal.  Throat and pharynx normal.  Neck supple. No adenopathy in the neck. Nose is congested. Sinuses non tender. The chest is clear, without wheezes or rales.  ASSESSMENT:  allergic rhinitis  PLAN: eRx sent for flonase, advised adding antihistamine- see AVS. Symptomatic therapy suggested: push fluids, rest and return office visit prn if symptoms persist or worsen. Lack of antibiotic effectiveness discussed with her. Call or return to clinic prn if  these symptoms worsen or fail to improve as anticipated.

## 2016-12-05 ENCOUNTER — Ambulatory Visit: Payer: Medicare Other | Admitting: Family Medicine

## 2016-12-05 ENCOUNTER — Telehealth: Payer: Self-pay | Admitting: Family Medicine

## 2016-12-05 MED ORDER — OSELTAMIVIR PHOSPHATE 75 MG PO CAPS
75.0000 mg | ORAL_CAPSULE | Freq: Two times a day (BID) | ORAL | 0 refills | Status: DC
Start: 2016-12-05 — End: 2017-06-20

## 2016-12-05 NOTE — Telephone Encounter (Signed)
Patient did not come in for their appointment today for fever, cough. Please let me know if patient needs to be contacted immediately for follow up or no follow up needed.

## 2016-12-05 NOTE — Telephone Encounter (Signed)
Husband tested positive for the flu this morning and now Annette Lee has a temp of 101.7 and body aches.  Asks for tamiflu to be sent to her pharmacy which is an appropriate request.   eRx sent.

## 2016-12-08 ENCOUNTER — Encounter: Payer: Self-pay | Admitting: Family Medicine

## 2016-12-08 NOTE — Telephone Encounter (Signed)
Mr Bankes [place] signed left v/m with info that is in this email. I called pt to see if she had read Dr Hulen Shouts response and she said she had not gotten out of bed so no. I read Dr Elonda Husky response and pt voiced understanding. Pt will cb if needed.

## 2017-01-09 DIAGNOSIS — D485 Neoplasm of uncertain behavior of skin: Secondary | ICD-10-CM | POA: Diagnosis not present

## 2017-01-09 DIAGNOSIS — D044 Carcinoma in situ of skin of scalp and neck: Secondary | ICD-10-CM | POA: Diagnosis not present

## 2017-01-30 ENCOUNTER — Other Ambulatory Visit: Payer: Self-pay | Admitting: Family Medicine

## 2017-02-10 DIAGNOSIS — D044 Carcinoma in situ of skin of scalp and neck: Secondary | ICD-10-CM | POA: Diagnosis not present

## 2017-03-03 ENCOUNTER — Encounter: Payer: Self-pay | Admitting: Family Medicine

## 2017-03-03 NOTE — Telephone Encounter (Signed)
Pt called due to going out of town for the summer on 03/07/17. Pt request cb on 03/06/17 that this has been done. Pt last seen 07/11/16.

## 2017-03-03 NOTE — Telephone Encounter (Signed)
Ok to phone in rx as entered below.

## 2017-03-06 MED ORDER — ESZOPICLONE 2 MG PO TABS: 2.0000 mg | ORAL_TABLET | Freq: Every evening | ORAL | 0 refills | Status: DC | PRN

## 2017-03-13 DIAGNOSIS — S92514A Nondisplaced fracture of proximal phalanx of right lesser toe(s), initial encounter for closed fracture: Secondary | ICD-10-CM | POA: Diagnosis not present

## 2017-03-13 DIAGNOSIS — S99921A Unspecified injury of right foot, initial encounter: Secondary | ICD-10-CM | POA: Diagnosis not present

## 2017-05-30 DIAGNOSIS — M19031 Primary osteoarthritis, right wrist: Secondary | ICD-10-CM | POA: Diagnosis not present

## 2017-05-30 DIAGNOSIS — M779 Enthesopathy, unspecified: Secondary | ICD-10-CM | POA: Diagnosis not present

## 2017-05-30 DIAGNOSIS — M25531 Pain in right wrist: Secondary | ICD-10-CM | POA: Diagnosis not present

## 2017-06-09 ENCOUNTER — Encounter: Payer: Self-pay | Admitting: Family Medicine

## 2017-06-16 DIAGNOSIS — D2271 Melanocytic nevi of right lower limb, including hip: Secondary | ICD-10-CM | POA: Diagnosis not present

## 2017-06-16 DIAGNOSIS — D225 Melanocytic nevi of trunk: Secondary | ICD-10-CM | POA: Diagnosis not present

## 2017-06-16 DIAGNOSIS — Z85828 Personal history of other malignant neoplasm of skin: Secondary | ICD-10-CM | POA: Diagnosis not present

## 2017-06-16 DIAGNOSIS — L821 Other seborrheic keratosis: Secondary | ICD-10-CM | POA: Diagnosis not present

## 2017-06-20 ENCOUNTER — Ambulatory Visit (INDEPENDENT_AMBULATORY_CARE_PROVIDER_SITE_OTHER): Payer: Medicare Other | Admitting: Family Medicine

## 2017-06-20 ENCOUNTER — Encounter: Payer: Self-pay | Admitting: Family Medicine

## 2017-06-20 VITALS — BP 120/70 | HR 66 | Ht 63.75 in | Wt 130.0 lb

## 2017-06-20 DIAGNOSIS — M778 Other enthesopathies, not elsewhere classified: Secondary | ICD-10-CM | POA: Diagnosis not present

## 2017-06-20 NOTE — Progress Notes (Signed)
SUBJECTIVE: Annette Lee is a 81 y.o. female who complaints of right wrist pain 2 month(s) ago. Mechanism of injury: knitting. Immediate symptoms: immediate pain. Symptoms have been constant since that time. Prior history of related problems: no prior problems with this area in the past.  [place] Wrist xray done.  Given course of prednisone.  Symptoms improving but persisting.  Current Outpatient Prescriptions on File Prior to Visit  Medication Sig Dispense Refill  . eszopiclone (LUNESTA) 2 MG TABS tablet Take 1 tablet (2 mg total) by mouth at bedtime as needed. 30 tablet 0  . fluticasone (FLONASE) 50 MCG/ACT nasal spray Place 2 sprays into both nostrils daily. 16 g 2  . oxymetazoline (AFRIN) 0.05 % nasal spray Place 1 spray into both nostrils 2 (two) times daily as needed.      No current facility-administered medications on file prior to visit.     Allergies  Allergen Reactions  . Contrast Media [Iodinated Diagnostic Agents] Anaphylaxis    Past Medical History:  Diagnosis Date  . Allergic rhinitis due to pollen   . Hyperlipidemia   . Osteopenia 11/12   T -1.8 in hip  . Sleep disturbance   . Vaginal atrophy     Past Surgical History:  Procedure Laterality Date  . ABDOMINAL HYSTERECTOMY    . APPENDECTOMY    . CATARACT EXTRACTION W/ INTRAOCULAR LENS  IMPLANT, BILATERAL    . COLONOSCOPY WITH PROPOFOL N/A 01/22/2016   Procedure: COLONOSCOPY WITH PROPOFOL;  Surgeon: Josefine Class, MD;  Location: [facility] ENDOSCOPY;  Service: Endoscopy;  Laterality: N/A;  . ROTATOR CUFF REPAIR Right ~2001    Family History  Problem Relation Age of Onset  . Cancer Mother        CLL  . Cancer Father        colon  . Aneurysm Father   . Cancer Brother        colon cancer  . Heart disease Maternal Uncle   . Diabetes Neg Hx   . Breast cancer Neg Hx     Social History   Social History  . Marital status: Married    Spouse name: N/A  . Number of children: 0   . Years of education: N/A   Occupational History  . Artist/designer/substitute teacher     Retired   Social History Main Topics  . Smoking status: Former Research scientist (life sciences)  . Smokeless tobacco: Never Used  . Alcohol use Yes  . Drug use: No  . Sexual activity: No   Other Topics Concern  . Not on file   Social History Narrative   Has living will   Husband is health care POA.   Would accept resuscitation but no prolonged ventilation   Not sure about tube feeds   The PMH, PSH, Social History, Family History, Medications, and allergies have been reviewed in [facility], and have been updated if relevant.  OBJECTIVE:  BP 120/70   Pulse 66   Ht 5' 3.75" (1.619 m)   Wt 130 lb (59 kg)   SpO2 98%   BMI 22.49 kg/m   Vital signs as noted above. Appearance: alert, well appearing, and in no distress, oriented to person, place, and time and normal appearing weight. Wrist exam: scaphoid (snuffbox) tenderness absent, negative Phalen, remainder of ipsilateral wrist, hand and finger exam is normal.   ASSESSMENT: wrist tendonitis  PLAN: rest the injured area as much as practical, referral to Physical Therapy See orders for this visit  as documented in the electronic medical record.

## 2017-06-20 NOTE — Patient Instructions (Signed)
Great to see you. Please stop by to see Annette Lee on your way out.   

## 2017-06-20 NOTE — Addendum Note (Signed)
Addended by: Lucille Passy on: 06/20/2017 08:55 AM   Modules accepted: Orders

## 2017-06-21 ENCOUNTER — Ambulatory Visit: Payer: Medicare Other | Attending: Family Medicine | Admitting: Occupational Therapy

## 2017-06-21 DIAGNOSIS — M25532 Pain in left wrist: Secondary | ICD-10-CM | POA: Insufficient documentation

## 2017-06-21 DIAGNOSIS — M6281 Muscle weakness (generalized): Secondary | ICD-10-CM | POA: Diagnosis not present

## 2017-06-21 DIAGNOSIS — M25632 Stiffness of left wrist, not elsewhere classified: Secondary | ICD-10-CM | POA: Diagnosis not present

## 2017-06-21 NOTE — Patient Instructions (Signed)
Wrist splint for immobilization  Wear all time - of night time  AROM pain free range of motion for sup /pro, Flexion and extention  8 reps , 2 x day  Can use heat prior and ice end

## 2017-06-21 NOTE — Therapy (Signed)
[place] [facility] [address] [place], [address] Phone: [phone]   Fax:  [phone]  Occupational Therapy Evaluation  Patient Details  Name: Annette Lee MRN: [id number] Date of Birth: 12/18/1935 Referring Provider: Deborra Medina   Encounter Date: 06/21/2017      OT End of Session - 06/21/17 1317    Visit Number 1   Number of Visits 10   Date for OT Re-Evaluation 07/26/17   OT Start Time 0956   OT Stop Time 1100   OT Time Calculation (min) 64 min   Activity Tolerance Patient tolerated treatment well   Behavior During Therapy [facility] for tasks assessed/performed      Past Medical History:  Diagnosis Date  . Allergic rhinitis due to pollen   . Hyperlipidemia   . Osteopenia 11/12   T -1.8 in hip  . Sleep disturbance   . Vaginal atrophy     Past Surgical History:  Procedure Laterality Date  . ABDOMINAL HYSTERECTOMY    . APPENDECTOMY    . CATARACT EXTRACTION W/ INTRAOCULAR LENS  IMPLANT, BILATERAL    . COLONOSCOPY WITH PROPOFOL N/A 01/22/2016   Procedure: COLONOSCOPY WITH PROPOFOL;  Surgeon: Josefine Class, MD;  Location: [facility] ENDOSCOPY;  Service: Endoscopy;  Laterality: N/A;  . ROTATOR CUFF REPAIR Right ~2001    There were no vitals filed for this visit.      Subjective Assessment - 06/21/17 1002    Subjective  It started about 2 months ago - had predisone -took edge off - but still painfull with rotation using seing machine    Patient Stated Goals Want the pain better - to use hand better - for lifting , stirring , twisting , chopping    Currently in Pain? Yes   Pain Score 6    Pain Location Wrist   Pain Orientation Right   Pain Descriptors / Indicators Stabbing;Sore   Pain Type Acute pain           [facility] OT Assessment - 06/21/17 0001      Assessment   Diagnosis R wrist tendinitix    Referring Provider Deborra Medina    Onset Date 04/28/17     Home  Environment   Lives With Spouse     Prior  Function   Vocation Retired   Leisure Like to [employer] , read , walk, [employer] manager on computer      AROM   Right Wrist Extension 55 Degrees   Right Wrist Flexion 74 Degrees  pain   Right Wrist Radial Deviation 24 Degrees  pull    Right Wrist Ulnar Deviation 24 Degrees  pain full 9/10     Strength   Right Hand Grip (lbs) 25  pain   Right Hand Lateral Pinch 15 lbs   Right Hand 3 Point Pinch 16 lbs   Left Hand Grip (lbs) 45   Left Hand Lateral Pinch 15 lbs   Left Hand 3 Point Pinch 16 lbs        fluidotherapy done with AROM pain free - after 10 reps had some pain  End range and with use  Wrist splint fitted to wear all the time except sleeping    Review HEP and hand out provided  Wrist splint for immobilization  Wear all time - of night time  AROM pain free range of motion for sup /pro, Flexion and extention  8 reps , 2 x day  Can use heat prior and ice end  OT Education - 06/21/17 1317    Education provided Yes   Education Details findings of eval and HEP    Person(s) Educated Patient   Methods Explanation;Demonstration;Tactile cues;Verbal cues;Handout   Comprehension Verbal cues required;Returned demonstration;Verbalized understanding          OT Short Term Goals - 06/21/17 1326      OT SHORT TERM GOAL #1   Title Pain in L wrist improve on PRWHE by at least 15 points    Baseline at eval PRWHE pain score 23/50   Time 3   Period Weeks   Status New   Target Date 07/12/17     OT SHORT TERM GOAL #2   Title L wrist AROM improve to WNL with pain less than 2/10 to intiate strengthening    Baseline pain increase to 8/10 with UD , flexion and sup -    Time 4   Period Weeks   Status New   Target Date 07/19/17           OT Long Term Goals - 06/21/17 1327      OT LONG TERM GOAL #1   Title L wrist strenght increase for pt to use hand in more than 75 % of activities at home with no increase symptoms    Baseline favoring it - cannot  pull, lift, twist    Time 5   Period Weeks   Status New   Target Date 07/26/17     OT LONG TERM GOAL #2   Title L grip strength increase by at least 10 lbs to carry more than 5 lbs at home , cut with knife and no increase symptoms    Baseline Grip 25 L , R 45 -   Time 5   Period Weeks   Status New   Target Date 07/26/17     OT LONG TERM GOAL #3   Title Function score on PRWHE improve with more than 12 points    Baseline Function score on PRWHE at eval 18/50    Time 5   Period Weeks   Status New   Target Date 07/26/17               Plan - 06/21/17 1321    Clinical Impression Statement Pt present with R wrist tendinitis - symptoms for about 2 months - did take in Predisone after it started but still have pain and issues with wrist during lifting , carrying , twisting ,pulling - pain at rest 0/10 but with  UD , pronation , flexion can increase to 8/10 - tender over distal ulnar wrist  - pt still pain full with AROM -  pt was fitted with wrist splint to use all the time but off 2 x day for painfree AROM for wrist- pain limiting her ROM , strength and functionals use    Occupational performance deficits (Please refer to evaluation for details): ADL's;IADL's;Play;Leisure   Rehab Potential Good   OT Frequency 2x / week   OT Duration --  5 weeks    OT Treatment/Interventions Self-care/ADL training;Fluidtherapy;Splinting;Patient/family education;Therapeutic exercises;Contrast Bath;Ultrasound;Iontophoresis;Cryotherapy;Passive range of motion;Manual Therapy;Parrafin   Plan assess progress with homeprogram    Clinical Decision Making Limited treatment options, no task modification necessary   OT Home Exercise Plan see pt instruction   Consulted and Agree with Plan of Care Patient      Patient will benefit from skilled therapeutic intervention in order to improve the following deficits and impairments:  Impaired flexibility, Pain, Decreased range  of motion, Decreased  strength  Visit Diagnosis: Pain in left wrist - Plan: Ot plan of care cert/re-cert  Stiffness of left wrist, not elsewhere classified - Plan: Ot plan of care cert/re-cert  Muscle weakness (generalized) - Plan: Ot plan of care cert/re-cert      G-Codes - 11/91/47 1331    Functional Assessment Tool Used (Outpatient only) AROM ,grip and prehension strength - PRWHE , clinical judgement   Functional Limitation Self care   Self Care Current Status (W2956) At least 20 percent but less than 40 percent impaired, limited or restricted   Self Care Goal Status (O1308) 0 percent impaired, limited or restricted      Problem List Patient Active Problem List   Diagnosis Date Noted  . Wrist tendonitis 06/20/2017  . History of colonic polyps 12/17/2015  . Vaginal atrophy   . Hyperlipidemia   . Allergic rhinitis due to pollen   . Sleep disturbance     Rosalyn Gess OTR/L,CLT 06/21/2017, 1:34 PM  [place] [facility] [address] [place], [address] Phone: [phone]   Fax:  [phone]  Name: Annette Lee MRN: [id number] Date of Birth: 1936/08/22

## 2017-06-27 ENCOUNTER — Ambulatory Visit: Payer: Medicare Other | Attending: Family Medicine | Admitting: Occupational Therapy

## 2017-06-27 DIAGNOSIS — M25532 Pain in left wrist: Secondary | ICD-10-CM | POA: Diagnosis not present

## 2017-06-27 DIAGNOSIS — M6281 Muscle weakness (generalized): Secondary | ICD-10-CM | POA: Insufficient documentation

## 2017-06-27 DIAGNOSIS — M25632 Stiffness of left wrist, not elsewhere classified: Secondary | ICD-10-CM | POA: Diagnosis not present

## 2017-06-27 NOTE — Patient Instructions (Signed)
Same HEP but pt to switch between wrist splint , and soft neoprene Benik splint

## 2017-06-27 NOTE — Therapy (Signed)
[facility] [address] [place], [address] Phone: [phone]   Fax:  [phone]  Occupational Therapy Treatment  Patient Details  Name: Annette Lee MRN: [id number] Date of Birth: September 14, 1936 Referring Provider: Deborra Medina   Encounter Date: 06/27/2017      OT End of Session - 06/27/17 1454    Visit Number 2   Number of Visits 10   Date for OT Re-Evaluation 07/26/17   OT Start Time 1430   OT Stop Time 1515   OT Time Calculation (min) 45 min   Activity Tolerance Patient tolerated treatment well   Behavior During Therapy [facility] for tasks assessed/performed      Past Medical History:  Diagnosis Date  . Allergic rhinitis due to pollen   . Hyperlipidemia   . Osteopenia 11/12   T -1.8 in hip  . Sleep disturbance   . Vaginal atrophy     Past Surgical History:  Procedure Laterality Date  . ABDOMINAL HYSTERECTOMY    . APPENDECTOMY    . CATARACT EXTRACTION W/ INTRAOCULAR LENS  IMPLANT, BILATERAL    . COLONOSCOPY WITH PROPOFOL N/A 01/22/2016   Procedure: COLONOSCOPY WITH PROPOFOL;  Surgeon: Josefine Class, MD;  Location: [facility] ENDOSCOPY;  Service: Endoscopy;  Laterality: N/A;  . ROTATOR CUFF REPAIR Right ~2001    There were no vitals filed for this visit.      Subjective Assessment - 06/27/17 1429    Subjective  I think my wrist is better - wore the splint about 80% of the time until today did about 50% - felt better    Patient Stated Goals Want the pain better - to use hand better - for lifting , stirring , twisting , chopping    Currently in Pain? Yes   Pain Score 8    Pain Location Wrist   Pain Orientation Right   Pain Descriptors / Indicators Stabbing;Sore   Pain Type Acute pain   Aggravating Factors  AROM for pronation and UD             [facility] OT Assessment - 06/27/17 0001      AROM   Right Wrist Extension 70 Degrees   Right Wrist Flexion 80 Degrees   Right Wrist Radial Deviation 28  Degrees   Right Wrist Ulnar Deviation 28 Degrees      Assess AROM for wrist  Palpation no pain at ulnar wrist this date - but report more distal per pt  ? Fighting wrist splint   but did had pain with pronation and RD  Pt fitted with Benik soft neoprene splint to use on and off with wrist splint with metal bar - as long as no pain with use  Can take off splints if just sitting  Cont same HEP  ionto with dexamethazone done - but had to adjust current 3 times and ed pt on what to expect   skin check prior and at end             OT Treatments/Exercises (OP) - 06/27/17 0001      Iontophoresis   Type of Iontophoresis Dexamethasone   Location Ulnar Wrist    Dose 1.5 current , small patch    Time --  started out .9 current , then increase to 1.2 , 2nd time 1.5                OT Education - 06/27/17 1454    Education provided Yes  Education Details splint wearing    Person(s) Educated Patient   Methods Explanation;Demonstration;Tactile cues;Verbal cues   Comprehension Verbal cues required;Returned demonstration;Verbalized understanding          OT Short Term Goals - 06/21/17 1326      OT SHORT TERM GOAL #1   Title Pain in L wrist improve on PRWHE by at least 15 points    Baseline at eval PRWHE pain score 23/50   Time 3   Period Weeks   Status New   Target Date 07/12/17     OT SHORT TERM GOAL #2   Title L wrist AROM improve to WNL with pain less than 2/10 to intiate strengthening    Baseline pain increase to 8/10 with UD , flexion and sup -    Time 4   Period Weeks   Status New   Target Date 07/19/17           OT Long Term Goals - 06/21/17 1327      OT LONG TERM GOAL #1   Title L wrist strenght increase for pt to use hand in more than 75 % of activities at home with no increase symptoms    Baseline favoring it - cannot pull, lift, twist    Time 5   Period Weeks   Status New   Target Date 07/26/17     OT LONG TERM GOAL #2   Title L grip  strength increase by at least 10 lbs to carry more than 5 lbs at home , cut with knife and no increase symptoms    Baseline Grip 25 L , R 45 -   Time 5   Period Weeks   Status New   Target Date 07/26/17     OT LONG TERM GOAL #3   Title Function score on PRWHE improve with more than 12 points    Baseline Function score on PRWHE at eval 18/50    Time 5   Period Weeks   Status New   Target Date 07/26/17               Plan - 06/27/17 1454    Clinical Impression Statement Pt pain improve since last time - still pain with RD , pronation - not as tender and pain more with motion this date - ionto initiated and pt can switch between wrist metal bar splint and noeprene with act - as long as pain free with splint use    Occupational performance deficits (Please refer to evaluation for details): ADL's;IADL's;Play;Leisure   Rehab Potential Good   OT Frequency 2x / week   OT Duration 4 weeks   OT Treatment/Interventions Self-care/ADL training;Fluidtherapy;Splinting;Patient/family education;Therapeutic exercises;Contrast Bath;Ultrasound;Iontophoresis;Cryotherapy;Passive range of motion;Manual Therapy;Parrafin   Plan assess ROM and pain    Clinical Decision Making Limited treatment options, no task modification necessary   OT Home Exercise Plan see pt instruction   Consulted and Agree with Plan of Care Patient      Patient will benefit from skilled therapeutic intervention in order to improve the following deficits and impairments:  Impaired flexibility, Pain, Decreased range of motion, Decreased strength  Visit Diagnosis: Pain in left wrist  Stiffness of left wrist, not elsewhere classified  Muscle weakness (generalized)    Problem List Patient Active Problem List   Diagnosis Date Noted  . Wrist tendonitis 06/20/2017  . History of colonic polyps 12/17/2015  . Vaginal atrophy   . Hyperlipidemia   . Allergic rhinitis due to pollen   .  Sleep disturbance     Rosalyn Gess  OTR/L,CLT  06/27/2017, 5:50 PM  [place] [facility] [address], [place], [address] Phone: [phone]   Fax:  [phone]  Name: Annette Lee MRN: [id number] Date of Birth: 1936-08-19

## 2017-06-29 ENCOUNTER — Ambulatory Visit: Payer: Medicare Other | Admitting: Occupational Therapy

## 2017-06-29 DIAGNOSIS — M25632 Stiffness of left wrist, not elsewhere classified: Secondary | ICD-10-CM | POA: Diagnosis not present

## 2017-06-29 DIAGNOSIS — M6281 Muscle weakness (generalized): Secondary | ICD-10-CM

## 2017-06-29 DIAGNOSIS — M25532 Pain in left wrist: Secondary | ICD-10-CM

## 2017-06-29 NOTE — Patient Instructions (Signed)
Same HEP but wear wrist splint with metal bar

## 2017-06-29 NOTE — Therapy (Signed)
[place] [facility] [address] [place], [address] Phone: [phone]   Fax:  [phone]  Occupational Therapy Treatment  Patient Details  Name: Annette Lee MRN: [id number] Date of Birth: January 22, 1936 Referring Provider: Deborra Medina   Encounter Date: 06/29/2017      OT End of Session - 06/29/17 1022    Visit Number 3   Number of Visits 10   Date for OT Re-Evaluation 07/26/17   OT Start Time 0950   OT Stop Time 1030   OT Time Calculation (min) 40 min   Activity Tolerance Patient tolerated treatment well   Behavior During Therapy [facility] for tasks assessed/performed      Past Medical History:  Diagnosis Date  . Allergic rhinitis due to pollen   . Hyperlipidemia   . Osteopenia 11/12   T -1.8 in hip  . Sleep disturbance   . Vaginal atrophy     Past Surgical History:  Procedure Laterality Date  . ABDOMINAL HYSTERECTOMY    . APPENDECTOMY    . CATARACT EXTRACTION W/ INTRAOCULAR LENS  IMPLANT, BILATERAL    . COLONOSCOPY WITH PROPOFOL N/A 01/22/2016   Procedure: COLONOSCOPY WITH PROPOFOL;  Surgeon: Josefine Class, MD;  Location: [facility] ENDOSCOPY;  Service: Endoscopy;  Laterality: N/A;  . ROTATOR CUFF REPAIR Right ~2001    There were no vitals filed for this visit.      Subjective Assessment - 06/29/17 0954    Subjective  Today hurt more - had busy day yesterday  - wore mostly  soft splint - I think I move it to much in soft splint    Patient Stated Goals Want the pain better - to use hand better - for lifting , stirring , twisting , chopping    Currently in Pain? Yes   Pain Score 3    Pain Location Wrist   Pain Orientation Right   Pain Descriptors / Indicators Shooting;Sharp   Pain Type Acute pain        Assess AROM for wrist  Palpation no pain at ulnar wrist this date - but report more distal per pt   but did had pain with pronation and RD   and some in flexion too                OT  Treatments/Exercises (OP) - 06/29/17 0001      Iontophoresis   Type of Iontophoresis Dexamethasone   Location Ulnar Wrist    Dose 1.7 current , 21 min , small patch    Time 21 min       Cont same HEP  -but wear wrist splint with metal bar  ionto with dexamethazone done - but had to adjust current 3 times and ed pt on what to expect   skin check prior and at end - had small scab from last time blister - pin head size - cut patch open around it            OT Education - 06/29/17 1021    Education provided Yes   Education Details splint wearing - painfree range    Person(s) Educated Patient   Methods Explanation;Demonstration;Tactile cues;Verbal cues   Comprehension Verbal cues required;Returned demonstration;Verbalized understanding          OT Short Term Goals - 06/21/17 1326      OT SHORT TERM GOAL #1   Title Pain in L wrist improve on PRWHE by at least 15 points  Baseline at eval PRWHE pain score 23/50   Time 3   Period Weeks   Status New   Target Date 07/12/17     OT SHORT TERM GOAL #2   Title L wrist AROM improve to WNL with pain less than 2/10 to intiate strengthening    Baseline pain increase to 8/10 with UD , flexion and sup -    Time 4   Period Weeks   Status New   Target Date 07/19/17           OT Long Term Goals - 06/21/17 1327      OT LONG TERM GOAL #1   Title L wrist strenght increase for pt to use hand in more than 75 % of activities at home with no increase symptoms    Baseline favoring it - cannot pull, lift, twist    Time 5   Period Weeks   Status New   Target Date 07/26/17     OT LONG TERM GOAL #2   Title L grip strength increase by at least 10 lbs to carry more than 5 lbs at home , cut with knife and no increase symptoms    Baseline Grip 25 L , R 45 -   Time 5   Period Weeks   Status New   Target Date 07/26/17     OT LONG TERM GOAL #3   Title Function score on PRWHE improve with more than 12 points    Baseline Function score  on PRWHE at eval 18/50    Time 5   Period Weeks   Status New   Target Date 07/26/17               Plan - 06/29/17 1023    Clinical Impression Statement Pt pain increase some since last time - RD worse than pronation - tenderness still better - ionto done after some soft tissue work with Demetrius Charity - pt to wear hard splint over the weekend    Occupational performance deficits (Please refer to evaluation for details): ADL's;IADL's;Play;Leisure   Rehab Potential Good   OT Frequency 2x / week   OT Duration 4 weeks   OT Treatment/Interventions Self-care/ADL training;Fluidtherapy;Splinting;Patient/family education;Therapeutic exercises;Contrast Bath;Ultrasound;Iontophoresis;Cryotherapy;Passive range of motion;Manual Therapy;Parrafin   Plan assess progress    Clinical Decision Making Limited treatment options, no task modification necessary   OT Home Exercise Plan see pt instruction   Consulted and Agree with Plan of Care Patient      Patient will benefit from skilled therapeutic intervention in order to improve the following deficits and impairments:  Impaired flexibility, Pain, Decreased range of motion, Decreased strength  Visit Diagnosis: Pain in left wrist  Stiffness of left wrist, not elsewhere classified  Muscle weakness (generalized)    Problem List Patient Active Problem List   Diagnosis Date Noted  . Wrist tendonitis 06/20/2017  . History of colonic polyps 12/17/2015  . Vaginal atrophy   . Hyperlipidemia   . Allergic rhinitis due to pollen   . Sleep disturbance     Rosalyn Gess OTR/L,CLT  06/29/2017, 10:25 AM  [place] [facility] [address], [place], [address] Phone: [phone]   Fax:  [phone]  Name: Lluvia Gwynne MRN: [id number] Date of Birth: 06-Jul-1936

## 2017-07-04 ENCOUNTER — Ambulatory Visit: Payer: Medicare Other | Admitting: Occupational Therapy

## 2017-07-04 DIAGNOSIS — M25532 Pain in left wrist: Secondary | ICD-10-CM

## 2017-07-04 DIAGNOSIS — M25632 Stiffness of left wrist, not elsewhere classified: Secondary | ICD-10-CM | POA: Diagnosis not present

## 2017-07-04 DIAGNOSIS — M6281 Muscle weakness (generalized): Secondary | ICD-10-CM | POA: Diagnosis not present

## 2017-07-04 NOTE — Patient Instructions (Signed)
Same HEP  

## 2017-07-04 NOTE — Therapy (Signed)
[facility] [address] [place], [address] Phone: [phone]   Fax:  [phone]  Occupational Therapy Treatment  Patient Details  Name: Annette Lee MRN: [id number] Date of Birth: Mar 12, 1936 Referring Provider: Deborra Medina   Encounter Date: 07/04/2017      OT End of Session - 07/04/17 1806    Visit Number 4   Number of Visits 10   Date for OT Re-Evaluation 07/26/17   OT Start Time 5284   OT Stop Time 1450   OT Time Calculation (min) 35 min   Activity Tolerance Patient tolerated treatment well   Behavior During Therapy [facility] for tasks assessed/performed      Past Medical History:  Diagnosis Date  . Allergic rhinitis due to pollen   . Hyperlipidemia   . Osteopenia 11/12   T -1.8 in hip  . Sleep disturbance   . Vaginal atrophy     Past Surgical History:  Procedure Laterality Date  . ABDOMINAL HYSTERECTOMY    . APPENDECTOMY    . CATARACT EXTRACTION W/ INTRAOCULAR LENS  IMPLANT, BILATERAL    . COLONOSCOPY WITH PROPOFOL N/A 01/22/2016   Procedure: COLONOSCOPY WITH PROPOFOL;  Surgeon: Josefine Class, MD;  Location: [facility] ENDOSCOPY;  Service: Endoscopy;  Laterality: N/A;  . ROTATOR CUFF REPAIR Right ~2001    There were no vitals filed for this visit.      Subjective Assessment - 07/04/17 1417    Subjective  I kept that splint on - and could tell difference - but I do so much and splint in way    Patient Stated Goals Want the pain better - to use hand better - for lifting , stirring , twisting , chopping    Currently in Pain? Yes   Pain Score 3    Pain Location Wrist   Pain Orientation Right   Pain Descriptors / Indicators Shooting             Assess AROM for wrist but less  pain with range  Palpation- tender pain at ulnar wrist this date  splint wearing discuss - HEP  ionto had this date 3rd  Session              OT Treatments/Exercises (OP) - 07/04/17 0001      Iontophoresis   Type of Iontophoresis Dexamethasone   Location Ulnar Wrist    Dose 2.0 current , 19 min , med patch    Time 19                OT Education - 07/04/17 1806    Education provided Yes   Education Details splint wearing - pain free with act   Person(s) Educated Patient   Methods Explanation;Demonstration;Tactile cues   Comprehension Verbal cues required;Returned demonstration;Verbalized understanding          OT Short Term Goals - 06/21/17 1326      OT SHORT TERM GOAL #1   Title Pain in L wrist improve on PRWHE by at least 15 points    Baseline at eval PRWHE pain score 23/50   Time 3   Period Weeks   Status New   Target Date 07/12/17     OT SHORT TERM GOAL #2   Title L wrist AROM improve to WNL with pain less than 2/10 to intiate strengthening    Baseline pain increase to 8/10 with UD , flexion and sup -    Time 4   Period Weeks  Status New   Target Date 07/19/17           OT Long Term Goals - 06/21/17 1327      OT LONG TERM GOAL #1   Title L wrist strenght increase for pt to use hand in more than 75 % of activities at home with no increase symptoms    Baseline favoring it - cannot pull, lift, twist    Time 5   Period Weeks   Status New   Target Date 07/26/17     OT LONG TERM GOAL #2   Title L grip strength increase by at least 10 lbs to carry more than 5 lbs at home , cut with knife and no increase symptoms    Baseline Grip 25 L , R 45 -   Time 5   Period Weeks   Status New   Target Date 07/26/17     OT LONG TERM GOAL #3   Title Function score on PRWHE improve with more than 12 points    Baseline Function score on PRWHE at eval 18/50    Time 5   Period Weeks   Status New   Target Date 07/26/17               Plan - 07/04/17 1807    Clinical Impression Statement Pt still pain with RD, UD and extention of wrist- but AROM improving and pain decrease from 10/10 to 3/10 - but still tender - pt to cont with immobilization  splint - and HEP    Occupational performance deficits (Please refer to evaluation for details): ADL's;IADL's;Play;Leisure   Rehab Potential Good   OT Frequency 2x / week   OT Duration 4 weeks   OT Treatment/Interventions Self-care/ADL training;Fluidtherapy;Splinting;Patient/family education;Therapeutic exercises;Contrast Bath;Ultrasound;Iontophoresis;Cryotherapy;Passive range of motion;Manual Therapy;Parrafin   Plan update HEP as needed    Clinical Decision Making Limited treatment options, no task modification necessary   OT Home Exercise Plan see pt instruction   Consulted and Agree with Plan of Care Patient      Patient will benefit from skilled therapeutic intervention in order to improve the following deficits and impairments:  Impaired flexibility, Pain, Decreased range of motion, Decreased strength  Visit Diagnosis: Pain in left wrist  Stiffness of left wrist, not elsewhere classified  Muscle weakness (generalized)    Problem List Patient Active Problem List   Diagnosis Date Noted  . Wrist tendonitis 06/20/2017  . History of colonic polyps 12/17/2015  . Vaginal atrophy   . Hyperlipidemia   . Allergic rhinitis due to pollen   . Sleep disturbance     Rosalyn Gess OTR/L,CLT 07/04/2017, 6:09 PM  [facility] [address], [place], [address] Phone: [phone]   Fax:  [phone]  Name: Annette Lee MRN: [id number] Date of Birth: 1935-12-11

## 2017-07-06 ENCOUNTER — Ambulatory Visit: Payer: Medicare Other | Admitting: Occupational Therapy

## 2017-07-06 DIAGNOSIS — M6281 Muscle weakness (generalized): Secondary | ICD-10-CM | POA: Diagnosis not present

## 2017-07-06 DIAGNOSIS — M25532 Pain in left wrist: Secondary | ICD-10-CM

## 2017-07-06 DIAGNOSIS — M25632 Stiffness of left wrist, not elsewhere classified: Secondary | ICD-10-CM | POA: Diagnosis not present

## 2017-07-06 NOTE — Therapy (Signed)
[facility] [address], [place], [address] Phone: [phone]   Fax:  [phone]  Occupational Therapy Treatment  Patient Details  Name: Annette Lee MRN: [id number] Date of Birth: [birth year] Referring Provider: Deborra Medina   Encounter Date: 07/06/2017      OT End of Session - 07/06/17 1610    Visit Number 5   Number of Visits 10   Date for OT Re-Evaluation 07/26/17   OT Start Time 1456   OT Stop Time 1544   OT Time Calculation (min) 48 min   Activity Tolerance Patient tolerated treatment well   Behavior During Therapy [facility] for tasks assessed/performed      Past Medical History:  Diagnosis Date  . Allergic rhinitis due to pollen   . Hyperlipidemia   . Osteopenia 11/12   T -1.8 in hip  . Sleep disturbance   . Vaginal atrophy     Past Surgical History:  Procedure Laterality Date  . ABDOMINAL HYSTERECTOMY    . APPENDECTOMY    . CATARACT EXTRACTION W/ INTRAOCULAR LENS  IMPLANT, BILATERAL    . COLONOSCOPY WITH PROPOFOL N/A 01/22/2016   Procedure: COLONOSCOPY WITH PROPOFOL;  Surgeon: Josefine Class, MD;  Location: [facility] ENDOSCOPY;  Service: Endoscopy;  Laterality: N/A;  . ROTATOR CUFF REPAIR Right ~2001    There were no vitals filed for this visit.      Subjective Assessment - 07/06/17 1609    Subjective  It feels better than last time - pain better - I can tell difference    Patient Stated Goals Want the pain better - to use hand better - for lifting , stirring , twisting , chopping    Currently in Pain? No/denies          Assess AROM for wrist  In all planes -  less  pain with range  Palpation- tenderness less   splint wearing discuss - all the time - off for ADL's and  HEP  ionto had this date 4 session - med patch - did not want to stick first and leaked - redone 2nd time - slow ramping up - but able to do 2.0 current again this date  Skin check done beginning and end  Take of med  patch in hour                           OT Treatments/Exercises (OP) - 07/06/17 0001      Iontophoresis   Type of Iontophoresis Dexamethasone   Location Ulnar Wrist    Dose 2.0 current , 19 min , med patch    Time 19                OT Education - 07/06/17 1610    Education provided Yes   Education Details cont with splint use and pain free use    Person(s) Educated Patient   Methods Explanation;Demonstration;Tactile cues;Verbal cues   Comprehension Verbal cues required;Returned demonstration;Verbalized understanding          OT Short Term Goals - 06/21/17 1326      OT SHORT TERM GOAL #1   Title Pain in L wrist improve on PRWHE by at least 15 points    Baseline at eval PRWHE pain score 23/50   Time 3   Period Weeks   Status New   Target Date 07/12/17     OT SHORT TERM GOAL #2   Title  L wrist AROM improve to WNL with pain less than 2/10 to intiate strengthening    Baseline pain increase to 8/10 with UD , flexion and sup -    Time 4   Period Weeks   Status New   Target Date 07/19/17           OT Long Term Goals - 06/21/17 1327      OT LONG TERM GOAL #1   Title L wrist strenght increase for pt to use hand in more than 75 % of activities at home with no increase symptoms    Baseline favoring it - cannot pull, lift, twist    Time 5   Period Weeks   Status New   Target Date 07/26/17     OT LONG TERM GOAL #2   Title L grip strength increase by at least 10 lbs to carry more than 5 lbs at home , cut with knife and no increase symptoms    Baseline Grip 25 L , R 45 -   Time 5   Period Weeks   Status New   Target Date 07/26/17     OT LONG TERM GOAL #3   Title Function score on PRWHE improve with more than 12 points    Baseline Function score on PRWHE at eval 18/50    Time 5   Period Weeks   Status New   Target Date 07/26/17               Plan - 07/06/17 1611    Clinical Impression Statement Pain this date better in RD<  UD, wrist flexion and exetnion - pronation - 2-3/10 at the worse - and tenderness better - pt to cont with splint use and pain free    Occupational performance deficits (Please refer to evaluation for details): ADL's;IADL's;Play;Leisure   Rehab Potential Good   OT Frequency 2x / week   OT Duration 4 weeks   OT Treatment/Interventions Self-care/ADL training;Fluidtherapy;Splinting;Patient/family education;Therapeutic exercises;Contrast Bath;Ultrasound;Iontophoresis;Cryotherapy;Passive range of motion;Manual Therapy;Parrafin   Plan assess progress in pain ad AROM    Clinical Decision Making Limited treatment options, no task modification necessary   OT Home Exercise Plan see pt instruction   Consulted and Agree with Plan of Care Patient      Patient will benefit from skilled therapeutic intervention in order to improve the following deficits and impairments:  Impaired flexibility, Pain, Decreased range of motion, Decreased strength  Visit Diagnosis: Pain in left wrist  Stiffness of left wrist, not elsewhere classified  Muscle weakness (generalized)    Problem List Patient Active Problem List   Diagnosis Date Noted  . Wrist tendonitis 06/20/2017  . History of colonic polyps 12/17/2015  . Vaginal atrophy   . Hyperlipidemia   . Allergic rhinitis due to pollen   . Sleep disturbance     Rosalyn Gess OTR/L,CLT  07/06/2017, 4:13 PM  [facility] [address] [place], [address] Phone: [phone]   Fax:  [phone]  Name: Annette Lee MRN: [id number] Date of Birth: [birth year]

## 2017-07-06 NOTE — Patient Instructions (Signed)
Same - splint on wrist with any use  Pain free use

## 2017-07-10 ENCOUNTER — Ambulatory Visit (INDEPENDENT_AMBULATORY_CARE_PROVIDER_SITE_OTHER): Payer: Medicare Other

## 2017-07-10 VITALS — BP 124/78 | HR 66 | Temp 97.8°F | Ht 63.5 in | Wt 128.5 lb

## 2017-07-10 DIAGNOSIS — Z0000 Encounter for general adult medical examination without abnormal findings: Secondary | ICD-10-CM

## 2017-07-10 DIAGNOSIS — E7849 Other hyperlipidemia: Secondary | ICD-10-CM

## 2017-07-10 DIAGNOSIS — E784 Other hyperlipidemia: Secondary | ICD-10-CM

## 2017-07-10 DIAGNOSIS — Z23 Encounter for immunization: Secondary | ICD-10-CM

## 2017-07-10 LAB — CBC WITH DIFFERENTIAL/PLATELET
BASOS PCT: 0.9 % (ref 0.0–3.0)
Basophils Absolute: 0.1 10*3/uL (ref 0.0–0.1)
EOS ABS: 0.2 10*3/uL (ref 0.0–0.7)
Eosinophils Relative: 2.4 % (ref 0.0–5.0)
HEMATOCRIT: 42.8 % (ref 36.0–46.0)
Hemoglobin: 14.4 g/dL (ref 12.0–15.0)
LYMPHS PCT: 21.2 % (ref 12.0–46.0)
Lymphs Abs: 1.7 10*3/uL (ref 0.7–4.0)
MCHC: 33.6 g/dL (ref 30.0–36.0)
MCV: 92.8 fl (ref 78.0–100.0)
MONO ABS: 0.7 10*3/uL (ref 0.1–1.0)
Monocytes Relative: 8.1 % (ref 3.0–12.0)
NEUTROS ABS: 5.4 10*3/uL (ref 1.4–7.7)
Neutrophils Relative %: 67.4 % (ref 43.0–77.0)
PLATELETS: 192 10*3/uL (ref 150.0–400.0)
RBC: 4.61 Mil/uL (ref 3.87–5.11)
RDW: 13.8 % (ref 11.5–15.5)
WBC: 8.1 10*3/uL (ref 4.0–10.5)

## 2017-07-10 LAB — LIPID PANEL
CHOL/HDL RATIO: 3
Cholesterol: 191 mg/dL (ref 0–200)
HDL: 68.5 mg/dL (ref >39.00)
LDL CALC: 107 mg/dL — AB (ref 0–99)
NonHDL: 122.05
Triglycerides: 77 mg/dL (ref 0.0–149.0)
VLDL: 15.4 mg/dL (ref 0.0–40.0)

## 2017-07-10 LAB — COMPREHENSIVE METABOLIC PANEL
ALT: 9 U/L (ref 0–35)
AST: 19 U/L (ref 0–37)
Albumin: 4.2 g/dL (ref 3.5–5.2)
Alkaline Phosphatase: 53 U/L (ref 39–117)
BILIRUBIN TOTAL: 0.6 mg/dL (ref 0.2–1.2)
BUN: 15 mg/dL (ref 6–23)
CHLORIDE: 107 meq/L (ref 96–112)
CO2: 26 meq/L (ref 19–32)
CREATININE: 0.78 mg/dL (ref 0.40–1.20)
Calcium: 9.7 mg/dL (ref 8.4–10.5)
GFR: 75.71 mL/min (ref >60.00)
GLUCOSE: 100 mg/dL — AB (ref 70–99)
Potassium: 4.2 mEq/L (ref 3.5–5.1)
SODIUM: 140 meq/L (ref 135–145)
Total Protein: 6.9 g/dL (ref 6.0–8.3)

## 2017-07-10 LAB — TSH: TSH: 1.63 u[IU]/mL (ref 0.35–4.50)

## 2017-07-10 NOTE — Progress Notes (Signed)
PCP notes:   Health maintenance:  Flu vaccine - administered  Abnormal screenings:   Hearing - failed  Hearing Screening   125Hz  250Hz  500Hz  1000Hz  2000Hz  3000Hz  4000Hz  6000Hz  8000Hz   Right ear:   0 0 40  0    Left ear:   0 0 40  40     Patient concerns:   None  Nurse concerns:  None  Next PCP appt:   07/18/17 @ 0830

## 2017-07-10 NOTE — Patient Instructions (Signed)
[name] , Thank you for taking time to come for your Medicare Wellness Visit. I appreciate your ongoing commitment to your health goals. Please review the following plan we discussed and let me know if I can assist you in the future.   These are the goals we discussed: Goals    . Increase physical activity          Starting 07/10/2017, I will continue to exercise for at least 30-60 min 4-5 days per week.        This is a list of the screening recommended for you and due dates:  Health Maintenance  Topic Date Due  . Tetanus Vaccine  07/11/2027  . Flu Shot  Completed  . DEXA scan (bone density measurement)  Completed  . Pneumonia vaccines  Completed   Preventive Care for Adults  A healthy lifestyle and preventive care can promote health and wellness. Preventive health guidelines for adults include the following key practices.  . A routine yearly physical is a good way to check with your health care provider about your health and preventive screening. It is a chance to share any concerns and updates on your health and to receive a thorough exam.  . Visit your dentist for a routine exam and preventive care every 6 months. Brush your teeth twice a day and floss once a day. Good oral hygiene prevents tooth decay and gum disease.  . The frequency of eye exams is based on your age, health, family medical history, use  of contact lenses, and other factors. Follow your health care provider's ecommendations for frequency of eye exams.  . Eat a healthy diet. Foods like vegetables, fruits, whole grains, low-fat dairy products, and lean protein foods contain the nutrients you need without too many calories. Decrease your intake of foods high in solid fats, added sugars, and salt. Eat the right amount of calories for you. Get information about a proper diet from your health care provider, if necessary.  . Regular physical exercise is one of the most important things you can do for your health.  Most adults should get at least 150 minutes of moderate-intensity exercise (any activity that increases your heart rate and causes you to sweat) each week. In addition, most adults need muscle-strengthening exercises on 2 or more days a week.  Silver Sneakers may be a benefit available to you. To determine eligibility, you may visit the website: www.silversneakers.com or contact program at 586-410-5897 Mon-Fri between 8AM-8PM.   . Maintain a healthy weight. The body mass index (BMI) is a screening tool to identify possible weight problems. It provides an estimate of body fat based on height and weight. Your health care provider can find your BMI and can help you achieve or maintain a healthy weight.   For adults 20 years and older: ? A BMI below 18.5 is considered underweight. ? A BMI of 18.5 to 24.9 is normal. ? A BMI of 25 to 29.9 is considered overweight. ? A BMI of 30 and above is considered obese.   . Maintain normal blood lipids and cholesterol levels by exercising and minimizing your intake of saturated fat. Eat a balanced diet with plenty of fruit and vegetables. Blood tests for lipids and cholesterol should begin at age 51 and be repeated every 5 years. If your lipid or cholesterol levels are high, you are over 50, or you are at high risk for heart disease, you may need your cholesterol levels checked more frequently. Ongoing high lipid  and cholesterol levels should be treated with medicines if diet and exercise are not working.  . If you smoke, find out from your health care provider how to quit. If you do not use tobacco, please do not start.  . If you choose to drink alcohol, please do not consume more than 2 drinks per day. One drink is considered to be 12 ounces (355 mL) of beer, 5 ounces (148 mL) of wine, or 1.5 ounces (44 mL) of liquor.  . If you are 79-55 years old, ask your health care provider if you should take aspirin to prevent strokes.  . Use sunscreen. Apply sunscreen  liberally and repeatedly throughout the day. You should seek shade when your shadow is shorter than you. Protect yourself by wearing long sleeves, pants, a wide-brimmed hat, and sunglasses year round, whenever you are outdoors.  . Once a month, do a whole body skin exam, using a mirror to look at the skin on your back. Tell your health care provider of new moles, moles that have irregular borders, moles that are larger than a pencil eraser, or moles that have changed in shape or color.

## 2017-07-10 NOTE — Progress Notes (Signed)
Subjective:   Annette Lee is a 81 y.o. female who presents for Medicare Annual (Subsequent) preventive examination.  Review of Systems:  N/A Cardiac Risk Factors include: advanced age (>52men, >73 women);dyslipidemia     Objective:     Vitals: BP 124/78 (BP Location: Right Arm, Patient Position: Sitting, Cuff Size: Normal)   Pulse 66   Temp 97.8 F (36.6 C) (Oral)   Ht 5' 3.5" (1.613 m) Comment: No shoes  Wt 128 lb 8 oz (58.3 kg)   SpO2 98%   BMI 22.41 kg/m   Body mass index is 22.41 kg/m.   Tobacco History  Smoking Status  . Former Smoker  Smokeless Tobacco  . Never Used     Counseling given: No   Past Medical History:  Diagnosis Date  . Allergic rhinitis due to pollen   . Hyperlipidemia   . Osteopenia 11/12   T -1.8 in hip  . Sleep disturbance   . Vaginal atrophy    Past Surgical History:  Procedure Laterality Date  . ABDOMINAL HYSTERECTOMY    . APPENDECTOMY    . CATARACT EXTRACTION W/ INTRAOCULAR LENS  IMPLANT, BILATERAL    . COLONOSCOPY WITH PROPOFOL N/A 01/22/2016   Procedure: COLONOSCOPY WITH PROPOFOL;  Surgeon: Josefine Class, MD;  Location: [facility] ENDOSCOPY;  Service: Endoscopy;  Laterality: N/A;  . ROTATOR CUFF REPAIR Right ~2001   Family History  Problem Relation Age of Onset  . Cancer Mother        CLL  . Cancer Father        colon  . Aneurysm Father   . Cancer Brother        colon cancer  . Heart disease Maternal Uncle   . Diabetes Neg Hx   . Breast cancer Neg Hx    History  Sexual Activity  . Sexual activity: No    Outpatient Encounter Prescriptions as of 07/10/2017  Medication Sig  . eszopiclone (LUNESTA) 2 MG TABS tablet Take 1 tablet (2 mg total) by mouth at bedtime as needed.  Marland Kitchen oxymetazoline (AFRIN) 0.05 % nasal spray Place 1 spray into both nostrils 2 (two) times daily as needed.   . simvastatin (ZOCOR) 10 MG tablet Take 10 mg by mouth once a week.  . [DISCONTINUED] fluticasone (FLONASE) 50 MCG/ACT nasal spray Place 2  sprays into both nostrils daily. (Patient not taking: Reported on 07/10/2017)   No facility-administered encounter medications on file as of 07/10/2017.     Activities of Daily Living In your present state of health, do you have any difficulty performing the following activities: 07/10/2017  Hearing? Y  Comment ringing in both ears  Vision? N  Difficulty concentrating or making decisions? N  Walking or climbing stairs? N  Dressing or bathing? N  Doing errands, shopping? N  Preparing Food and eating ? N  Using the Toilet? N  In the past six months, have you accidently leaked urine? Y  Do you have problems with loss of bowel control? N  Managing your Medications? N  Managing your Finances? N  Housekeeping or managing your Housekeeping? N  Some recent data might be hidden    Patient Care Team: Lucille Passy, MD as PCP - General (Family Medicine) Art Esperanza Sheets, MD as Consulting Physician (General Practice)    Assessment:     Hearing Screening   125Hz  250Hz  500Hz  1000Hz  2000Hz  3000Hz  4000Hz  6000Hz  8000Hz   Right ear:   0 0 40  0    Left ear:  0 0 40  40    Vision Screening Comments: Last vision exam in June 2018   Exercise Activities and Dietary recommendations Current Exercise Habits: Home exercise routine;Structured exercise class (exercise class 3 days per week for 25 minutes/class), Type of exercise: walking;stretching, Time (Minutes): 45, Frequency (Times/Week): 3, Weekly Exercise (Minutes/Week): 135, Intensity: Mild, Exercise limited by: None identified  Goals    . Increase physical activity          Starting 07/10/2017, I will continue to exercise for at least 30-60 min 4-5 days per week.       Fall Risk Fall Risk  07/10/2017 06/29/2016 07/13/2015 07/08/2014 07/08/2014  Falls in the past year? No No No No No   Depression Screen Depression screen [facility] 2/9 07/10/2017 06/29/2016 07/13/2015 07/08/2014 07/08/2014  Decreased Interest 0 0 0 0 0  Down, Depressed, Hopeless 0 0 0 0 0  PHQ  - 2 Score 0 0 0 0 0  Altered sleeping 0 - - - -  Tired, decreased energy 0 - - - -  Change in appetite 0 - - - -  Feeling bad or failure about yourself  0 - - - -  Trouble concentrating 0 - - - -  Moving slowly or fidgety/restless 0 - - - -  Suicidal thoughts 0 - - - -  PHQ-9 Score 0 - - - -  Difficult doing work/chores Not difficult at all - - - -    Cognitive Function MMSE - Mini Mental State Exam 07/10/2017 06/29/2016  Orientation to time 5 5  Orientation to Place 5 5  Registration 3 3  Attention/ Calculation 0 0  Recall 3 3  Language- name 2 objects 0 0  Language- repeat 1 1  Language- follow 3 step command 3 3  Language- read & follow direction 0 0  Write a sentence 0 0  Copy design 0 0  Total score 20 20       PLEASE NOTE: A Mini-Cog screen was completed. Maximum score is 20. A value of 0 denotes this part of Folstein MMSE was not completed or the patient failed this part of the Mini-Cog screening.   Mini-Cog Screening Orientation to Time - Max 5 pts Orientation to Place - Max 5 pts Registration - Max 3 pts Recall - Max 3 pts Language Repeat - Max 1 pts Language Follow 3 Step Command - Max 3 pts   Immunization History  Administered Date(s) Administered  . Influenza Split 08/02/2013  . Influenza,inj,Quad PF,6+ Mos 07/08/2014, 07/13/2015, 06/29/2016  . Pneumococcal Conjugate-13 09/11/2014  . Pneumococcal Polysaccharide-23 09/10/2013  . Zoster 09/11/2014   Screening Tests Health Maintenance  Topic Date Due  . TETANUS/TDAP  07/11/2027 (Originally 06/12/1955)  . INFLUENZA VACCINE  Completed  . DEXA SCAN  Completed  . PNA vac Low Risk Adult  Completed      Plan:     I have personally reviewed and addressed the Medicare Annual Wellness questionnaire and have noted the following in the patient's chart:  A. Medical and social history B. Use of alcohol, tobacco or illicit drugs  C. Current medications and supplements D. Functional ability and status E.    Nutritional status F.  Physical activity G. Advance directives H. List of other physicians I.  Hospitalizations, surgeries, and ER visits in previous 12 months J.  [facility] to include hearing, vision, cognitive, depression L. Referrals and appointments - none  In addition, I have reviewed and discussed with patient certain preventive  protocols, quality metrics, and best practice recommendations. A written personalized care plan for preventive services as well as general preventive health recommendations were provided to patient.  See attached scanned questionnaire for additional information.   Signed,   Lindell Noe, MHA, BS, LPN Health Coach

## 2017-07-10 NOTE — Progress Notes (Signed)
Pre visit review using our clinic review tool, if applicable. No additional management support is needed unless otherwise documented below in the visit note. 

## 2017-07-10 NOTE — Progress Notes (Signed)
I reviewed health advisor's note, was available for consultation, and agree with documentation and plan.  

## 2017-07-11 ENCOUNTER — Ambulatory Visit: Payer: Medicare Other | Admitting: Occupational Therapy

## 2017-07-11 ENCOUNTER — Ambulatory Visit: Payer: Medicare Other

## 2017-07-11 DIAGNOSIS — M6281 Muscle weakness (generalized): Secondary | ICD-10-CM | POA: Diagnosis not present

## 2017-07-11 DIAGNOSIS — M25532 Pain in left wrist: Secondary | ICD-10-CM | POA: Diagnosis not present

## 2017-07-11 DIAGNOSIS — M25632 Stiffness of left wrist, not elsewhere classified: Secondary | ICD-10-CM | POA: Diagnosis not present

## 2017-07-11 NOTE — Patient Instructions (Signed)
Wear custom wrist splint to immobilize wrist from UD and RD , flexion  Wear all the time - off for bathing and dressing , 2 x day off for HEP

## 2017-07-11 NOTE — Therapy (Signed)
[facility] [address], [place], [address] Phone: [phone]   Fax:  [phone]  Occupational Therapy Treatment  Patient Details  Name: Annette Lee MRN: [id number] Date of Birth: 12/24/1935 Referring Provider: Deborra Medina   Encounter Date: 07/11/2017      OT End of Session - 07/11/17 1658    Visit Number 6   Number of Visits 10   Date for OT Re-Evaluation 07/26/17   OT Start Time 1408   OT Stop Time 1515   OT Time Calculation (min) 67 min   Activity Tolerance Patient tolerated treatment well   Behavior During Therapy [facility] for tasks assessed/performed      Past Medical History:  Diagnosis Date  . Allergic rhinitis due to pollen   . Hyperlipidemia   . Osteopenia 11/12   T -1.8 in hip  . Sleep disturbance   . Vaginal atrophy     Past Surgical History:  Procedure Laterality Date  . ABDOMINAL HYSTERECTOMY    . APPENDECTOMY    . CATARACT EXTRACTION W/ INTRAOCULAR LENS  IMPLANT, BILATERAL    . COLONOSCOPY WITH PROPOFOL N/A 01/22/2016   Procedure: COLONOSCOPY WITH PROPOFOL;  Surgeon: Josefine Class, MD;  Location: [facility] ENDOSCOPY;  Service: Endoscopy;  Laterality: N/A;  . ROTATOR CUFF REPAIR Right ~2001    There were no vitals filed for this visit.      Subjective Assessment - 07/11/17 1507    Subjective  I think I am using my hand to much - and the splint not holding it still - I can move it side to side    Patient Stated Goals Want the pain better - to use hand better - for lifting , stirring , twisting , chopping    Currently in Pain? Yes        Assess AROM pain in UD and RD, flexion , pronation end range  Palpation- tender pain at ulnar wrist this date and base of 5th MC splint wearing discuss - pt very active and prefab not immobilizing pt  Custom wrist splint fabricated this date to wear same as prefab   ionto had this date 5th   Session                   OT  Treatments/Exercises (OP) - 07/11/17 0001      Iontophoresis   Type of Iontophoresis Dexamethasone   Location Ulnar Wrist    Dose 1.5 current , 21 min  med patch    Time 21 min                 OT Education - 07/11/17 1658    Education provided Yes   Education Details custom splint wearing    Person(s) Educated Patient   Methods Explanation;Demonstration   Comprehension Verbalized understanding;Returned demonstration          OT Short Term Goals - 06/21/17 1326      OT SHORT TERM GOAL #1   Title Pain in L wrist improve on PRWHE by at least 15 points    Baseline at eval PRWHE pain score 23/50   Time 3   Period Weeks   Status New   Target Date 07/12/17     OT SHORT TERM GOAL #2   Title L wrist AROM improve to WNL with pain less than 2/10 to intiate strengthening    Baseline pain increase to 8/10 with UD , flexion and sup -  Time 4   Period Weeks   Status New   Target Date 07/19/17           OT Long Term Goals - 06/21/17 1327      OT LONG TERM GOAL #1   Title L wrist strenght increase for pt to use hand in more than 75 % of activities at home with no increase symptoms    Baseline favoring it - cannot pull, lift, twist    Time 5   Period Weeks   Status New   Target Date 07/26/17     OT LONG TERM GOAL #2   Title L grip strength increase by at least 10 lbs to carry more than 5 lbs at home , cut with knife and no increase symptoms    Baseline Grip 25 L , R 45 -   Time 5   Period Weeks   Status New   Target Date 07/26/17     OT LONG TERM GOAL #3   Title Function score on PRWHE improve with more than 12 points    Baseline Function score on PRWHE at eval 18/50    Time 5   Period Weeks   Status New   Target Date 07/26/17               Plan - 07/11/17 1659    Clinical Impression Statement Pt cont to have pain on ulnar side of wrist , tenderness- pt prefab wrist splint not immolize pt - custom wrist splint fabricated this date to wear - pt  very active - pt to cont with splint and off with ADl's and HEP - pt do have arthritic changes at base of 5th MC per xray done earlier this year - 5 session of ionto    Occupational performance deficits (Please refer to evaluation for details): ADL's;IADL's;Leisure;Play   Rehab Potential Good   OT Frequency 2x / week   OT Duration 4 weeks   OT Treatment/Interventions Self-care/ADL training;Fluidtherapy;Splinting;Patient/family education;Therapeutic exercises;Contrast Bath;Ultrasound;Iontophoresis;Cryotherapy;Passive range of motion;Manual Therapy;Parrafin   Plan assess pain and splint wearing    Clinical Decision Making Limited treatment options, no task modification necessary   OT Home Exercise Plan see pt instruction   Consulted and Agree with Plan of Care Patient      Patient will benefit from skilled therapeutic intervention in order to improve the following deficits and impairments:  Impaired flexibility, Pain, Decreased range of motion, Decreased strength  Visit Diagnosis: Pain in left wrist  Stiffness of left wrist, not elsewhere classified  Muscle weakness (generalized)    Problem List Patient Active Problem List   Diagnosis Date Noted  . Wrist tendonitis 06/20/2017  . History of colonic polyps 12/17/2015  . Vaginal atrophy   . Hyperlipidemia   . Allergic rhinitis due to pollen   . Sleep disturbance     Rosalyn Gess OTR/L,CLT 07/11/2017, 5:03 PM  [facility] [address] [place], [address] Phone: [phone]   Fax:  [phone]  Name: Nyonna Hargrove MRN: [id number] Date of Birth: Jun 30, 1936

## 2017-07-13 ENCOUNTER — Ambulatory Visit: Payer: Medicare Other | Admitting: Occupational Therapy

## 2017-07-13 DIAGNOSIS — M25532 Pain in left wrist: Secondary | ICD-10-CM

## 2017-07-13 DIAGNOSIS — M6281 Muscle weakness (generalized): Secondary | ICD-10-CM | POA: Diagnosis not present

## 2017-07-13 DIAGNOSIS — M25632 Stiffness of left wrist, not elsewhere classified: Secondary | ICD-10-CM

## 2017-07-13 NOTE — Patient Instructions (Signed)
Can do isometric in neutral for wrist  In flexion/ext,RD ,UD  10 reps  Each

## 2017-07-13 NOTE — Therapy (Signed)
[facility] [address] [place], [address] Phone: [phone]   Fax:  [phone]  Occupational Therapy Treatment  Patient Details  Name: Annette Lee MRN: [id number] Date of Birth: Aug 24, 1936 Referring Provider: Deborra Medina   Encounter Date: 07/13/2017      OT End of Session - 07/13/17 1522    Visit Number 7   Number of Visits 10   Date for OT Re-Evaluation 07/26/17   OT Start Time 2778   OT Stop Time 1530   OT Time Calculation (min) 43 min   Activity Tolerance Patient tolerated treatment well   Behavior During Therapy [facility] for tasks assessed/performed      Past Medical History:  Diagnosis Date  . Allergic rhinitis due to pollen   . Hyperlipidemia   . Osteopenia 11/12   T -1.8 in hip  . Sleep disturbance   . Vaginal atrophy     Past Surgical History:  Procedure Laterality Date  . ABDOMINAL HYSTERECTOMY    . APPENDECTOMY    . CATARACT EXTRACTION W/ INTRAOCULAR LENS  IMPLANT, BILATERAL    . COLONOSCOPY WITH PROPOFOL N/A 01/22/2016   Procedure: COLONOSCOPY WITH PROPOFOL;  Surgeon: Josefine Class, MD;  Location: [facility] ENDOSCOPY;  Service: Endoscopy;  Laterality: N/A;  . ROTATOR CUFF REPAIR Right ~2001    There were no vitals filed for this visit.      Subjective Assessment - 07/13/17 1520    Subjective  THis splint do prevent me moving my wrist to much - I can tell it made different   Patient Stated Goals Want the pain better - to use hand better - for lifting , stirring , twisting , chopping    Currently in Pain? No/denies          Assess AROM pain in UD and RD, flexion , pronation end range No pain except slightly into UD  No pain this date in sup /pro  Tenderness better  Isometric strengthening done  In neutral  for flexion/ext, RD, UD - no pain  10 reps each   splint wearing discuss - pt very active  Cont with Custom wrist splint fabricated last time  to wear of for ADL's and  2 x day for pain free AROM    ionto had this date 6th  Session               OT Treatments/Exercises (OP) - 07/13/17 0001      Iontophoresis   Type of Iontophoresis Dexamethasone   Location Ulnar Wrist    Dose 1.8 current , 20 min  med patch    Time 20                OT Education - 07/13/17 1521    Education provided Yes   Education Details isometric strengthening to wrist in neutral    Person(s) Educated Patient   Methods Explanation;Demonstration;Tactile cues;Verbal cues   Comprehension Returned demonstration;Verbalized understanding          OT Short Term Goals - 06/21/17 1326      OT SHORT TERM GOAL #1   Title Pain in L wrist improve on PRWHE by at least 15 points    Baseline at eval PRWHE pain score 23/50   Time 3   Period Weeks   Status New   Target Date 07/12/17     OT SHORT TERM GOAL #2   Title L wrist AROM improve to WNL with pain less than  2/10 to intiate strengthening    Baseline pain increase to 8/10 with UD , flexion and sup -    Time 4   Period Weeks   Status New   Target Date 07/19/17           OT Long Term Goals - 06/21/17 1327      OT LONG TERM GOAL #1   Title L wrist strenght increase for pt to use hand in more than 75 % of activities at home with no increase symptoms    Baseline favoring it - cannot pull, lift, twist    Time 5   Period Weeks   Status New   Target Date 07/26/17     OT LONG TERM GOAL #2   Title L grip strength increase by at least 10 lbs to carry more than 5 lbs at home , cut with knife and no increase symptoms    Baseline Grip 25 L , R 45 -   Time 5   Period Weeks   Status New   Target Date 07/26/17     OT LONG TERM GOAL #3   Title Function score on PRWHE improve with more than 12 points    Baseline Function score on PRWHE at eval 18/50    Time 5   Period Weeks   Status New   Target Date 07/26/17               Plan - 07/13/17 1523    Occupational performance deficits (Please  refer to evaluation for details): ADL's;IADL's;Leisure;Play   Rehab Potential Good   OT Frequency 2x / week   OT Duration 4 weeks   OT Treatment/Interventions Self-care/ADL training;Fluidtherapy;Splinting;Patient/family education;Therapeutic exercises;Contrast Bath;Ultrasound;Iontophoresis;Cryotherapy;Passive range of motion;Manual Therapy;Parrafin   Plan assess pain and ROM - splint wearing    Clinical Decision Making Limited treatment options, no task modification necessary   OT Home Exercise Plan see pt instruction   Consulted and Agree with Plan of Care Patient      Patient will benefit from skilled therapeutic intervention in order to improve the following deficits and impairments:  Impaired flexibility, Pain, Decreased range of motion, Decreased strength  Visit Diagnosis: Pain in left wrist  Stiffness of left wrist, not elsewhere classified  Muscle weakness (generalized)    Problem List Patient Active Problem List   Diagnosis Date Noted  . Wrist tendonitis 06/20/2017  . History of colonic polyps 12/17/2015  . Vaginal atrophy   . Hyperlipidemia   . Allergic rhinitis due to pollen   . Sleep disturbance     Rosalyn Gess  OTR/L,CLT 07/13/2017, 3:32 PM  [facility] [address] [place], [address] Phone: [phone]   Fax:  [phone]  Name: Annette Lee MRN: [id number] Date of Birth: 01-30-1936

## 2017-07-17 ENCOUNTER — Ambulatory Visit: Payer: Medicare Other | Admitting: Occupational Therapy

## 2017-07-17 DIAGNOSIS — M6281 Muscle weakness (generalized): Secondary | ICD-10-CM | POA: Diagnosis not present

## 2017-07-17 DIAGNOSIS — M25632 Stiffness of left wrist, not elsewhere classified: Secondary | ICD-10-CM | POA: Diagnosis not present

## 2017-07-17 DIAGNOSIS — M25532 Pain in left wrist: Secondary | ICD-10-CM

## 2017-07-17 NOTE — Patient Instructions (Signed)
Same than the last week

## 2017-07-17 NOTE — Therapy (Signed)
[facility] [address], [place], [address] Phone: [phone]   Fax:  [phone]  Occupational Therapy Treatment  Patient Details  Name: Annette Lee MRN: [id number] Date of Birth: 09/10/1936 Referring Provider: Deborra Medina   Encounter Date: 07/17/2017      OT End of Session - 07/17/17 1817    Visit Number 8   Number of Visits 10   Date for OT Re-Evaluation 07/26/17   OT Start Time 1740   OT Stop Time 1525   OT Time Calculation (min) 40 min   Activity Tolerance Patient tolerated treatment well   Behavior During Therapy [facility] for tasks assessed/performed      Past Medical History:  Diagnosis Date  . Allergic rhinitis due to pollen   . Hyperlipidemia   . Osteopenia 11/12   T -1.8 in hip  . Sleep disturbance   . Vaginal atrophy     Past Surgical History:  Procedure Laterality Date  . ABDOMINAL HYSTERECTOMY    . APPENDECTOMY    . CATARACT EXTRACTION W/ INTRAOCULAR LENS  IMPLANT, BILATERAL    . COLONOSCOPY WITH PROPOFOL N/A 01/22/2016   Procedure: COLONOSCOPY WITH PROPOFOL;  Surgeon: Josefine Class, MD;  Location: [facility] ENDOSCOPY;  Service: Endoscopy;  Laterality: N/A;  . ROTATOR CUFF REPAIR Right ~2001    There were no vitals filed for this visit.      Subjective Assessment - 07/17/17 1815    Subjective  I had to switch some time over the weekend to the tan splint - the blue one was hurting to side of my wrist- but I thinkthe pain is better when I use it with bathing /dressing  if or do exercises    Patient Stated Goals Want the pain better - to use hand better - for lifting , stirring , twisting , chopping    Currently in Pain? No/denies        Assess AROM in all planes - no pain - some stiff feeling in wrist - but no pain  Some pain at base of 5th MC - arthritis per xray taken months before  Tenderness better   Isometric strengthening review in  neutral  for flexion/ext, RD, UD - no  pain  10 reps each   splint wearing discuss - pt very active  Adjusted custome splint with padding on radial border and expanded forearm part at radial side  Cont with Custom wrist splint fabricated last week or prefab- as long no pain   to wear off  for ADL's and 2 x day for pain free AROM    ionto had this date 7th Session                 OT Treatments/Exercises (OP) - 07/17/17 0001      Iontophoresis   Type of Iontophoresis Dexamethasone   Location Ulnar Wrist    Dose 1.89 current , 19 min  med patch    Time 19                OT Education - 07/17/17 1816    Education provided Yes   Education Details Splint wearing and isometric again review per pt request   Person(s) Educated Patient   Methods Explanation;Demonstration;Tactile cues;Verbal cues   Comprehension Verbal cues required;Verbalized understanding;Returned demonstration          OT Short Term Goals - 06/21/17 1326      OT SHORT TERM GOAL #1   Title  Pain in L wrist improve on PRWHE by at least 15 points    Baseline at eval PRWHE pain score 23/50   Time 3   Period Weeks   Status New   Target Date 07/12/17     OT SHORT TERM GOAL #2   Title L wrist AROM improve to WNL with pain less than 2/10 to intiate strengthening    Baseline pain increase to 8/10 with UD , flexion and sup -    Time 4   Period Weeks   Status New   Target Date 07/19/17           OT Long Term Goals - 06/21/17 1327      OT LONG TERM GOAL #1   Title L wrist strenght increase for pt to use hand in more than 75 % of activities at home with no increase symptoms    Baseline favoring it - cannot pull, lift, twist    Time 5   Period Weeks   Status New   Target Date 07/26/17     OT LONG TERM GOAL #2   Title L grip strength increase by at least 10 lbs to carry more than 5 lbs at home , cut with knife and no increase symptoms    Baseline Grip 25 L , R 45 -   Time 5   Period Weeks   Status New   Target Date  07/26/17     OT LONG TERM GOAL #3   Title Function score on PRWHE improve with more than 12 points    Baseline Function score on PRWHE at eval 18/50    Time 5   Period Weeks   Status New   Target Date 07/26/17               Plan - 07/17/17 1817    Clinical Impression Statement Pt show decrease pain this date with AROM in all planes - tenderness better - observe pt using hand with pocket book without pain - lifting and putting objects away - pt to do same HEP - and plan to upgrde HEP and wean to neoprene splint     Occupational performance deficits (Please refer to evaluation for details): ADL's;IADL's;Leisure;Play   Rehab Potential Good   OT Frequency 2x / week   OT Duration 2 weeks   OT Treatment/Interventions Self-care/ADL training;Fluidtherapy;Splinting;Patient/family education;Therapeutic exercises;Contrast Bath;Ultrasound;Iontophoresis;Cryotherapy;Passive range of motion;Manual Therapy;Parrafin   Plan upgrade if possible and wean to neoprene splint    Clinical Decision Making Limited treatment options, no task modification necessary   OT Home Exercise Plan see pt instruction      Patient will benefit from skilled therapeutic intervention in order to improve the following deficits and impairments:  Impaired flexibility, Pain, Decreased range of motion, Decreased strength  Visit Diagnosis: Pain in left wrist  Stiffness of left wrist, not elsewhere classified  Muscle weakness (generalized)    Problem List Patient Active Problem List   Diagnosis Date Noted  . Wrist tendonitis 06/20/2017  . History of colonic polyps 12/17/2015  . Vaginal atrophy   . Hyperlipidemia   . Allergic rhinitis due to pollen   . Sleep disturbance     Rosalyn Gess  OTR/L,CLT  07/17/2017, 6:19 PM  [facility] [address], [place], [address] Phone: [phone]   Fax:  [phone]  Name: Sonoma Firkus MRN:  [id number] Date of Birth: 1936/02/18

## 2017-07-18 ENCOUNTER — Encounter: Payer: Self-pay | Admitting: Family Medicine

## 2017-07-18 ENCOUNTER — Ambulatory Visit (INDEPENDENT_AMBULATORY_CARE_PROVIDER_SITE_OTHER): Payer: Medicare Other | Admitting: Family Medicine

## 2017-07-18 VITALS — BP 126/64 | HR 72 | Temp 98.0°F | Wt 129.8 lb

## 2017-07-18 DIAGNOSIS — Z86010 Personal history of colonic polyps: Secondary | ICD-10-CM | POA: Diagnosis not present

## 2017-07-18 DIAGNOSIS — E785 Hyperlipidemia, unspecified: Secondary | ICD-10-CM

## 2017-07-18 DIAGNOSIS — G479 Sleep disorder, unspecified: Secondary | ICD-10-CM | POA: Diagnosis not present

## 2017-07-18 NOTE — Progress Notes (Signed)
Subjective:   Patient ID: Annette Lee, female    DOB: 1935/11/06, 81 y.o.   MRN: [id number]  Annette Lee is a pleasant 81 y.o. year old female, for Follow-up  on 07/18/2017  HPI:  Annual wellness visit with Candis Musa, RN on 07/10/17.  Notes reviewed.    HLD- has been taking Simvastatin 10 mg weekly.  Lab Results  Component Value Date   CHOL 191 07/10/2017   HDL 68.50 07/10/2017   LDLCALC 107 (H) 07/10/2017   TRIG 77.0 07/10/2017   CHOLHDL 3 07/10/2017    Insomnia- Lunesta. Does not take it nightly.  Lab Results  Component Value Date   TSH 1.63 07/10/2017   Lab Results  Component Value Date   WBC 8.1 07/10/2017   HGB 14.4 07/10/2017   HCT 42.8 07/10/2017   MCV 92.8 07/10/2017   PLT 192.0 07/10/2017   Lab Results  Component Value Date   NA 140 07/10/2017   K 4.2 07/10/2017   CL 107 07/10/2017   CO2 26 07/10/2017   Lab Results  Component Value Date   CREATININE 0.78 07/10/2017   Lab Results  Component Value Date   ALT 9 07/10/2017   AST 19 07/10/2017   ALKPHOS 53 07/10/2017   BILITOT 0.6 07/10/2017    Allergic rhinitis- has fall allergies.  Taking OTC antihistamine.  Current Outpatient Prescriptions on File Prior to Visit  Medication Sig Dispense Refill  . eszopiclone (LUNESTA) 2 MG TABS tablet Take 1 tablet (2 mg total) by mouth at bedtime as needed. 30 tablet 0  . oxymetazoline (AFRIN) 0.05 % nasal spray Place 1 spray into both nostrils 2 (two) times daily as needed.     . simvastatin (ZOCOR) 10 MG tablet Take 10 mg by mouth once a week.     No current facility-administered medications on file prior to visit.     Allergies  Allergen Reactions  . Contrast Media [Iodinated Diagnostic Agents] Anaphylaxis    Past Medical History:  Diagnosis Date  . Allergic rhinitis due to pollen   . Hyperlipidemia   . Osteopenia 11/12   T -1.8 in hip  . Sleep disturbance   . Vaginal atrophy     Past Surgical History:  Procedure Laterality Date    . ABDOMINAL HYSTERECTOMY    . APPENDECTOMY    . CATARACT EXTRACTION W/ INTRAOCULAR LENS  IMPLANT, BILATERAL    . COLONOSCOPY WITH PROPOFOL N/A 01/22/2016   Procedure: COLONOSCOPY WITH PROPOFOL;  Surgeon: Josefine Class, MD;  Location: [facility] ENDOSCOPY;  Service: Endoscopy;  Laterality: N/A;  . ROTATOR CUFF REPAIR Right ~2001    Family History  Problem Relation Age of Onset  . Cancer Mother        CLL  . Cancer Father        colon  . Aneurysm Father   . Cancer Brother        colon cancer  . Heart disease Maternal Uncle   . Diabetes Neg Hx   . Breast cancer Neg Hx     Social History   Social History  . Marital status: Married    Spouse name: N/A  . Number of children: 0  . Years of education: N/A   Occupational History  . Artist/designer/substitute teacher     Retired   Social History Main Topics  . Smoking status: Former Research scientist (life sciences)  . Smokeless tobacco: Never Used  . Alcohol use 1.8 oz/week    3 Shots of liquor per week  Comment: drinks gin  . Drug use: No  . Sexual activity: No   Other Topics Concern  . Not on file   Social History Narrative   Has living will   Husband is health care POA.   Would accept resuscitation but no prolonged ventilation   Not sure about tube feeds   The PMH, PSH, Social History, Family History, Medications, and allergies have been reviewed in [facility], and have been updated if relevant.    Review of Systems  Constitutional: Negative.   HENT: Negative.   Eyes: Negative.   Respiratory: Negative.   Cardiovascular: Negative.   Gastrointestinal: Negative.   Endocrine: Negative.   Genitourinary: Negative.   Musculoskeletal: Negative.   Skin: Negative.   Allergic/Immunologic: Negative.   Neurological: Negative.   Hematological: Negative.   Psychiatric/Behavioral: Negative.   All other systems reviewed and are negative.        Objective:    BP 126/64 (BP Location: Left Arm, Patient Position: Sitting, Cuff Size: Normal)    Pulse 72   Temp 98 F (36.7 C) (Oral)   Wt 129 lb 12 oz (58.9 kg)   SpO2 98%   BMI 22.62 kg/m   Wt Readings from Last 3 Encounters:  07/18/17 129 lb 12 oz (58.9 kg)  07/10/17 128 lb 8 oz (58.3 kg)  06/20/17 130 lb (59 kg)    Physical Exam  Constitutional: She is oriented to person, place, and time. She appears well-developed and well-nourished. No distress.  HENT:  Head: Normocephalic.  Eyes: Pupils are equal, round, and reactive to light.  Neck: Normal range of motion. Neck supple. No thyromegaly present.  Cardiovascular: Normal rate, regular rhythm and normal heart sounds.   Pulmonary/Chest: Effort normal and breath sounds normal. No respiratory distress. She has no wheezes.  Abdominal: Soft. Bowel sounds are normal. She exhibits no distension. There is no tenderness.  Musculoskeletal: Normal range of motion.  Neurological: She is alert and oriented to person, place, and time. No cranial nerve deficit.  Skin: Skin is warm and dry.  Psychiatric: She has a normal mood and affect. Her behavior is normal. Judgment and thought content normal.  Nursing note and vitals reviewed.         Assessment & Plan:   Sleep disturbance  Hyperlipidemia, unspecified hyperlipidemia type No Follow-up on file.

## 2017-07-18 NOTE — Assessment & Plan Note (Signed)
Well controlled on current dose of statin. No changes made today.4

## 2017-07-18 NOTE — Assessment & Plan Note (Signed)
Well controlled with as needed [place]

## 2017-07-18 NOTE — Assessment & Plan Note (Signed)
Colonoscopy UTD. 

## 2017-07-20 ENCOUNTER — Ambulatory Visit: Payer: Medicare Other | Admitting: Occupational Therapy

## 2017-07-20 DIAGNOSIS — M25532 Pain in left wrist: Secondary | ICD-10-CM

## 2017-07-20 DIAGNOSIS — M6281 Muscle weakness (generalized): Secondary | ICD-10-CM | POA: Diagnosis not present

## 2017-07-20 DIAGNOSIS — M25632 Stiffness of left wrist, not elsewhere classified: Secondary | ICD-10-CM | POA: Diagnosis not present

## 2017-07-20 NOTE — Therapy (Signed)
[facility] [address], [place], [address] Phone: [phone]   Fax:  [phone]  Occupational Therapy Treatment  Patient Details  Name: Annette Lee MRN: [id number] Date of Birth: 1936-05-24 Referring Provider: Deborra Medina   Encounter Date: 07/20/2017      OT End of Session - 07/20/17 1236    Visit Number 9   Number of Visits 10   Date for OT Re-Evaluation 07/26/17   OT Start Time 0858   OT Stop Time 0937   OT Time Calculation (min) 39 min   Activity Tolerance Patient tolerated treatment well   Behavior During Therapy [facility] for tasks assessed/performed      Past Medical History:  Diagnosis Date  . Allergic rhinitis due to pollen   . Hyperlipidemia   . Osteopenia 11/12   T -1.8 in hip  . Sleep disturbance   . Vaginal atrophy     Past Surgical History:  Procedure Laterality Date  . ABDOMINAL HYSTERECTOMY    . APPENDECTOMY    . CATARACT EXTRACTION W/ INTRAOCULAR LENS  IMPLANT, BILATERAL    . COLONOSCOPY WITH PROPOFOL N/A 01/22/2016   Procedure: COLONOSCOPY WITH PROPOFOL;  Surgeon: Josefine Class, MD;  Location: [facility] ENDOSCOPY;  Service: Endoscopy;  Laterality: N/A;  . ROTATOR CUFF REPAIR Right ~2001    There were no vitals filed for this visit.      Subjective Assessment - 07/20/17 1234    Subjective  After you talked about arthriis last time - I took the sugar out of my diet - and can tell difference and tried to take my splint more off if I was just sitting - pain still good    Patient Stated Goals Want the pain better - to use hand better - for lifting , stirring , twisting , chopping    Currently in Pain? No/denies       pt arrive with prefab wrist splint on - pain and tenderness less than 2/10 on base of 5th MC and ulnar styloid - pt stiffness in wrist with AROM  But after fluido - less pain  And stiffness  Simulated some act that cause pain prior to therapy - had less pain but  afterwards some discomfort   pt to use splint as needed with act that cause pain  Do heat and AROM for wrist Isometric for end range - flexion , ext , RD , UD  10 reps each  [place] at 3.3MHZ at 20% over ulnar side of wrist - 4 min and 1.0 intensity                        OT Education - 07/20/17 1236    Education provided Yes   Education Details splint wearing and HEP for ROM and isometric    Person(s) Educated Patient   Methods Explanation;Demonstration;Tactile cues;Verbal cues   Comprehension Verbal cues required;Returned demonstration;Verbalized understanding          OT Short Term Goals - 06/21/17 1326      OT SHORT TERM GOAL #1   Title Pain in L wrist improve on PRWHE by at least 15 points    Baseline at eval PRWHE pain score 23/50   Time 3   Period Weeks   Status New   Target Date 07/12/17     OT SHORT TERM GOAL #2   Title L wrist AROM improve to WNL with pain less than 2/10 to intiate  strengthening    Baseline pain increase to 8/10 with UD , flexion and sup -    Time 4   Period Weeks   Status New   Target Date 07/19/17           OT Long Term Goals - 07/20/17 1238      OT LONG TERM GOAL #1   Title L wrist strenght increase for pt to use hand in more than 75 % of activities at home with no increase symptoms    Baseline still incrase pain and use of splints - did not had pain in clinic more than 2//10 with act    Time 3   Period Weeks   Status On-going   Target Date 07/26/17     OT LONG TERM GOAL #2   Title L grip strength increase by at least 10 lbs to carry more than 5 lbs at home , cut with knife and no increase symptoms    Baseline Grip 25 L , R 45 - will assess next session    Time 3   Period Weeks   Status New   Target Date 07/26/17     OT LONG TERM GOAL #3   Title Function score on PRWHE improve with more than 12 points    Baseline Function score on PRWHE at eval 18/50    Time 3   Period Weeks   Status On-going   Target Date  07/26/17               Plan - 07/20/17 1237    Clinical Impression Statement Pt cont to have some pain at ulnar side but more at base of 5th MC - and tenderness at ulnar styloid - pt stiffness in wrist but decrease after fluido done - end range isometric added and  splint wearing only with act that cause pain    Occupational performance deficits (Please refer to evaluation for details): ADL's;IADL's;Leisure;Play   Rehab Potential Good   OT Frequency 2x / week   OT Duration 2 weeks   OT Treatment/Interventions Self-care/ADL training;Fluidtherapy;Splinting;Patient/family education;Therapeutic exercises;Contrast Bath;Ultrasound;Iontophoresis;Cryotherapy;Passive range of motion;Manual Therapy;Parrafin   Plan assess pain - use and splint wearing    Clinical Decision Making Limited treatment options, no task modification necessary   OT Home Exercise Plan see pt instruction   Consulted and Agree with Plan of Care Patient      Patient will benefit from skilled therapeutic intervention in order to improve the following deficits and impairments:  Impaired flexibility, Pain, Decreased range of motion, Decreased strength  Visit Diagnosis: Pain in left wrist  Stiffness of left wrist, not elsewhere classified  Muscle weakness (generalized)    Problem List Patient Active Problem List   Diagnosis Date Noted  . History of colonic polyps 12/17/2015  . Hyperlipidemia   . Allergic rhinitis due to pollen   . Sleep disturbance     Rosalyn Gess OTR/L,CLT  07/20/2017, 12:41 PM  [facility] [address], [place], [address] Phone: [phone]   Fax:  [phone]  Name: Annette Lee MRN: [id number] Date of Birth: 31-May-1936

## 2017-07-20 NOTE — Patient Instructions (Signed)
Wrist splint on with act that cause pain on ulnar side of wrist or base of 5th MC  Isometric strengthening - end range for RD, UD , flexion and Ext  10 reps  Warm water and AROM for wrist pain free range in all planes

## 2017-07-24 ENCOUNTER — Ambulatory Visit: Payer: Medicare Other | Attending: Family Medicine | Admitting: Occupational Therapy

## 2017-07-24 DIAGNOSIS — M6281 Muscle weakness (generalized): Secondary | ICD-10-CM | POA: Insufficient documentation

## 2017-07-24 DIAGNOSIS — M25632 Stiffness of left wrist, not elsewhere classified: Secondary | ICD-10-CM

## 2017-07-24 DIAGNOSIS — M25532 Pain in left wrist: Secondary | ICD-10-CM | POA: Diagnosis not present

## 2017-07-24 NOTE — Patient Instructions (Signed)
Pt ed on using wrist splints with act that cause pain at base of 5th MC  Do AROM for wrist in all planes - pain free  Joint protection principles review and hand out provided

## 2017-07-24 NOTE — Therapy (Signed)
[facility] [address], [place], [address] Phone: [phone]   Fax:  [phone]  Occupational Therapy Treatment  Patient Details  Name: Annette Lee MRN: [id number] Date of Birth: 01/10/1936 Referring Provider: Deborra Medina   Encounter Date: 07/24/2017      OT End of Session - 07/24/17 1259    Visit Number 10   Number of Visits 10   Date for OT Re-Evaluation 07/26/17   OT Start Time 1153   OT Stop Time 1226   OT Time Calculation (min) 33 min   Activity Tolerance Patient tolerated treatment well   Behavior During Therapy [facility] for tasks assessed/performed      Past Medical History:  Diagnosis Date  . Allergic rhinitis due to pollen   . Hyperlipidemia   . Osteopenia 11/12   T -1.8 in hip  . Sleep disturbance   . Vaginal atrophy     Past Surgical History:  Procedure Laterality Date  . ABDOMINAL HYSTERECTOMY    . APPENDECTOMY    . CATARACT EXTRACTION W/ INTRAOCULAR LENS  IMPLANT, BILATERAL    . COLONOSCOPY WITH PROPOFOL N/A 01/22/2016   Procedure: COLONOSCOPY WITH PROPOFOL;  Surgeon: Josefine Class, MD;  Location: [facility] ENDOSCOPY;  Service: Endoscopy;  Laterality: N/A;  . ROTATOR CUFF REPAIR Right ~2001    There were no vitals filed for this visit.      Subjective Assessment - 07/24/17 1254    Subjective  I am thinking it is my arthritis that is bothering me now - not as much the wrist that it did in the past - and if I feel pain 1-2x day - I put the splint on    Patient Stated Goals Want the pain better - to use hand better - for lifting , stirring , twisting , chopping    Currently in Pain? No/denies            [facility] OT Assessment - 07/24/17 0001      Strength   Right Hand Grip (lbs) 35   Right Hand Lateral Pinch 15 lbs   Right Hand 3 Point Pinch 18 lbs   Left Hand Grip (lbs) 40   Left Hand Lateral Pinch 19 lbs   Left Hand 3 Point Pinch 18 lbs      Assess AROM for R wrist in  all planes - WNL  Pain with open hand and when over pronation  With fist not pain sup /pro  No pain with isometric end range  But if combining wide grip and RD, UD - has some discomfort - but appear to be close to base of 5th [facility] ' Pt has arthritis there - pt to use splint for wrist with act that cause pain  Pt ed on joint protection - hand out review and provided  - use larger joint  Avoid tight and sustained grip  Modify if pain lingers more than 2 hrs or hurting in 12-24 hrs after act  Pt to phone OT in 3 wks  Grip and prehension strength assess - pt above her range for age in prehension -and in range for grip                     OT Education - 07/24/17 1258    Education provided Yes   Education Details progress and HEP to do for the next 3 wks    Person(s) Educated Patient   Methods Explanation;Demonstration;Tactile cues;Verbal cues;Handout  Comprehension Verbal cues required;Returned demonstration;Verbalized understanding          OT Short Term Goals - 07/24/17 1303      OT SHORT TERM GOAL #1   Title Pain in L wrist improve on PRWHE by at least 15 points    Baseline at eval PRWHE pain score 23/50 - decrease at the worse 2-3/10  will do PRWHE next time    Time 3   Period Weeks   Status On-going   Target Date 08/14/17     OT SHORT TERM GOAL #2   Title L wrist AROM improve to WNL with pain less than 2/10 to intiate strengthening    Status Achieved           OT Long Term Goals - 07/24/17 1303      OT LONG TERM GOAL #1   Title L wrist strenght increase for pt to use hand in more than 75 % of activities at home with no increase symptoms    Baseline still incrase pain and use of splints - did not had pain in clinic more than 2//10 with act    Time 3   Period Weeks   Status On-going   Target Date 08/14/17     OT LONG TERM GOAL #2   Title L grip strength increase by at least 10 lbs to carry more than 5 lbs at home , cut with knife and no increase  symptoms    Baseline Grip 35 L , R 45 -but still some pain with cutting large veggies    Time 3   Period Weeks   Status On-going   Target Date 08/14/17     OT LONG TERM GOAL #3   Title Function score on PRWHE improve with more than 12 points    Baseline Function score on PRWHE at eval 18/50 - will assess next session    Time 3   Period Weeks   Status On-going   Target Date 08/14/17               Plan - 07/24/17 1259    Clinical Impression Statement Pt pain compare to eval decrease - do not present with ECU pain - but more pain at base of 5th where she has arthritis - grip increase and no pain with resistance to wrist in all planes -  AROM for wrist WNL - pt to use wrist splint with act that cause pain - use joint protection principles for larger joint , and avoid static , sustainded or tight grip - pain with over pronation of wrist - but decrease pain if 5th MC stabilized - pt to cont with homeprogram for 3 wks and phone  this OT    Occupational performance deficits (Please refer to evaluation for details): ADL's;IADL's;Play;Leisure   Rehab Potential Good   OT Frequency Other (comment)  phone OT in 3wks    OT Duration 4 weeks   OT Treatment/Interventions Self-care/ADL training;Fluidtherapy;Splinting;Patient/family education;Therapeutic exercises;Contrast Bath;Ultrasound;Iontophoresis;Cryotherapy;Passive range of motion;Manual Therapy;Parrafin   Plan pt to phone in 3wks - to report how she is doing with homeprogram   Clinical Decision Making Limited treatment options, no task modification necessary   OT Home Exercise Plan see pt instruction   Consulted and Agree with Plan of Care Patient      Patient will benefit from skilled therapeutic intervention in order to improve the following deficits and impairments:  Impaired flexibility, Pain, Decreased range of motion, Decreased strength  Visit Diagnosis: Pain in left  wrist  Stiffness of left wrist, not elsewhere  classified  Muscle weakness (generalized)    Problem List Patient Active Problem List   Diagnosis Date Noted  . History of colonic polyps 12/17/2015  . Hyperlipidemia   . Allergic rhinitis due to pollen   . Sleep disturbance     Rosalyn Gess OTR/L,CLT 07/24/2017, 1:05 PM  [facility] [address] [place], [address] Phone: [phone]   Fax:  [phone]  Name: Annette Lee MRN: [id number] Date of Birth: [birth year]

## 2017-08-30 ENCOUNTER — Telehealth: Payer: Self-pay | Admitting: *Deleted

## 2017-08-30 NOTE — Telephone Encounter (Signed)
Pt agrees to see Dr. Lorelei Pont on Monday am at 9/thx dmf

## 2017-08-30 NOTE — Telephone Encounter (Signed)
Copied from [facility] #[id number]. Topic: Referral - Request >> Aug 30, 2017 10:41 AM Ahmed Prima L wrote: Reason for CRM:  Patient is wanting to know if she can get a referral for her wrist (bad strain). She normally sees DR Deborra Medina but is going to be transferring care to [place]. She was not sure if she needed to be seen or could just get a referral without the appt. Please give her a call back at [phone]

## 2017-08-30 NOTE — Telephone Encounter (Signed)
Can she come in to see Dr. Lorelei Pont?

## 2017-09-04 ENCOUNTER — Other Ambulatory Visit: Payer: Self-pay

## 2017-09-04 ENCOUNTER — Encounter: Payer: Self-pay | Admitting: Family Medicine

## 2017-09-04 ENCOUNTER — Ambulatory Visit (INDEPENDENT_AMBULATORY_CARE_PROVIDER_SITE_OTHER): Payer: Medicare Other | Admitting: Family Medicine

## 2017-09-04 ENCOUNTER — Encounter: Payer: Self-pay | Admitting: *Deleted

## 2017-09-04 VITALS — BP 130/64 | HR 66 | Temp 98.3°F | Ht 63.5 in | Wt 130.2 lb

## 2017-09-04 DIAGNOSIS — M778 Other enthesopathies, not elsewhere classified: Secondary | ICD-10-CM

## 2017-09-04 DIAGNOSIS — M779 Enthesopathy, unspecified: Principal | ICD-10-CM

## 2017-09-04 MED ORDER — PREDNISONE 20 MG PO TABS
ORAL_TABLET | ORAL | 0 refills | Status: DC
Start: 2017-09-04 — End: 2017-11-09

## 2017-09-04 NOTE — Progress Notes (Signed)
Dr. Frederico Hamman T. Larrie Lucia, MD, [facility] [place], [address] Phone: [phone] Fax: [phone]  09/04/2017  Patient: Annette Lee, MRN: [id number], DOB: November 02, 1935, 81 y.o.  Primary Physician:  Lucille Passy, MD   Chief Complaint  Patient presents with  . Wrist Pain    Right   Subjective:   Annette Lee is a 81 y.o. very pleasant female patient who presents with the following:  Used sewing maching all summer long. Went to PT and that did not help.  Ulnar aspect  RHD. She was doing some rotational movements repetitively throughout much of the summer and developed some pain on the lower aspect of her wrist.  Since that time she has tried a number of things including a cockup wrist splint intermittently as well as some anti-inflammatories.  She also did one course of prednisone a number of months ago.  It actually did get better, but then she resumed activities and it came back again.  Physical therapy did not seem to help.  The patient did do occupational therapy.  Past Medical History, Surgical History, Social History, Family History, Problem List, Medications, and Allergies have been reviewed and updated if relevant.  Patient Active Problem List   Diagnosis Date Noted  . History of colonic polyps 12/17/2015  . Hyperlipidemia   . Allergic rhinitis due to pollen   . Sleep disturbance     Past Medical History:  Diagnosis Date  . Allergic rhinitis due to pollen   . Hyperlipidemia   . Osteopenia 11/12   T -1.8 in hip  . Sleep disturbance   . Vaginal atrophy     Past Surgical History:  Procedure Laterality Date  . ABDOMINAL HYSTERECTOMY    . APPENDECTOMY    . CATARACT EXTRACTION W/ INTRAOCULAR LENS  IMPLANT, BILATERAL    . ROTATOR CUFF REPAIR Right ~2001    Social History   Socioeconomic History  . Marital status: Married    Spouse name: Not on file  . Number of children: 0  . Years of education:  Not on file  . Highest education level: Not on file  Social Needs  . Financial resource strain: Not on file  . Food insecurity - worry: Not on file  . Food insecurity - inability: Not on file  . Transportation needs - medical: Not on file  . Transportation needs - non-medical: Not on file  Occupational History  . Occupation: Scientist, water quality    Comment: Retired  Tobacco Use  . Smoking status: Former Research scientist (life sciences)  . Smokeless tobacco: Never Used  Substance and Sexual Activity  . Alcohol use: Yes    Alcohol/week: 1.8 oz    Types: 3 Shots of liquor per week    Comment: drinks gin  . Drug use: No  . Sexual activity: No  Other Topics Concern  . Not on file  Social History Narrative   Has living will   Husband is health care POA.   Would accept resuscitation but no prolonged ventilation   Not sure about tube feeds    Family History  Problem Relation Age of Onset  . Cancer Mother        CLL  . Cancer Father        colon  . Aneurysm Father   . Cancer Brother        colon cancer  . Heart disease Maternal Uncle   . Diabetes Neg Hx   . Breast  cancer Neg Hx     Allergies  Allergen Reactions  . Contrast Media [Iodinated Diagnostic Agents] Anaphylaxis    Medication list reviewed and updated in full in [facility].  GEN: No fevers, chills. Nontoxic. Primarily MSK c/o today. MSK: Detailed in the HPI GI: tolerating PO intake without difficulty Neuro: No numbness, parasthesias, or tingling associated. Otherwise the pertinent positives of the ROS are noted above.   Objective:   BP 130/64   Pulse 66   Temp 98.3 F (36.8 C) (Oral)   Ht 5' 3.5" (1.613 m)   Wt 130 lb 4 oz (59.1 kg)   BMI 22.71 kg/m    GEN: WDWN, NAD, Non-toxic, Alert & Oriented x 3 HEENT: Atraumatic, Normocephalic.  Ears and Nose: No external deformity. EXTR: No clubbing/cyanosis/edema NEURO: Normal gait.  PSYCH: Normally interactive. Conversant. Not depressed or anxious appearing.   Calm demeanor.   Hand: R Ecchymosis or edema: neg ROM wrist/hand/digits/elbow: full  Carpals, MCP's, digits: NT Distal Ulna and Radius: some pain beyond distal ulna Supination lift test: neg Ecchymosis or edema: neg Cysts/nodules: neg Finkelstein's test: neg Snuffbox tenderness: neg Scaphoid tubercle: NT Hook of [place]ate: NT Resisted supination: NT Full composite fist Grip, all digits: 5/5 str No tenosynovitis Axial load test: neg Phalen's: neg Tinel's: neg Atrophy: neg  Flexion is nontender.  Isolation and extension of the forefoot and fifth digits with extension as well as to a lesser extent the third causes pain.  Hand sensation: intact   Radiology: No results found.   Assessment and Plan:   Tendinitis of extensor tendon of right hand   Tendinopathy vs tenosynovitis of at least 5th and 6th dorsal compartments of hand fairly distal - less common anatomical location.  10 days of steroids with cock-up wrist splint and slow, gentle rehab when calming down.  Follow-up: No Follow-up on file.  Future Appointments  Date Time Provider [place]  09/20/2017 10:00 AM McLean-Scocuzza, Nino Glow, MD [facility]    Meds ordered this encounter  Medications  . diphenhydrAMINE (BENADRYL) 25 MG tablet    Sig: Take 25 mg every 6 (six) hours as needed by mouth.  . predniSONE (DELTASONE) 20 MG tablet    Sig: 2 tabs po for 5 days, then 1 tab po for 5 days    Dispense:  15 tablet    Refill:  0   Signed,  Satya Buttram T. Jeral Zick, MD   Allergies as of 09/04/2017      Reactions   Contrast Media [iodinated Diagnostic Agents] Anaphylaxis      Medication List        Accurate as of 09/04/17 11:59 PM. Always use your most recent med list.          diphenhydrAMINE 25 MG tablet Commonly known as:  BENADRYL Take 25 mg every 6 (six) hours as needed by mouth.   eszopiclone 2 MG Tabs tablet Commonly known as:  LUNESTA Take 1 tablet (2 mg total) by mouth at bedtime as  needed.   oxymetazoline 0.05 % nasal spray Commonly known as:  AFRIN Place 1 spray into both nostrils 2 (two) times daily as needed.   predniSONE 20 MG tablet Commonly known as:  DELTASONE 2 tabs po for 5 days, then 1 tab po for 5 days   simvastatin 10 MG tablet Commonly known as:  ZOCOR Take 10 mg by mouth once a week.

## 2017-09-05 ENCOUNTER — Encounter: Payer: Self-pay | Admitting: Family Medicine

## 2017-09-20 ENCOUNTER — Ambulatory Visit (INDEPENDENT_AMBULATORY_CARE_PROVIDER_SITE_OTHER): Payer: Medicare Other | Admitting: Internal Medicine

## 2017-09-20 ENCOUNTER — Encounter: Payer: Self-pay | Admitting: Internal Medicine

## 2017-09-20 VITALS — BP 118/54 | HR 73 | Temp 98.1°F | Ht 64.0 in | Wt 133.0 lb

## 2017-09-20 DIAGNOSIS — B351 Tinea unguium: Secondary | ICD-10-CM

## 2017-09-20 DIAGNOSIS — M8580 Other specified disorders of bone density and structure, unspecified site: Secondary | ICD-10-CM | POA: Diagnosis not present

## 2017-09-20 DIAGNOSIS — J358 Other chronic diseases of tonsils and adenoids: Secondary | ICD-10-CM

## 2017-09-20 DIAGNOSIS — J301 Allergic rhinitis due to pollen: Secondary | ICD-10-CM

## 2017-09-20 DIAGNOSIS — Z1231 Encounter for screening mammogram for malignant neoplasm of breast: Secondary | ICD-10-CM | POA: Diagnosis not present

## 2017-09-20 DIAGNOSIS — N3281 Overactive bladder: Secondary | ICD-10-CM | POA: Insufficient documentation

## 2017-09-20 LAB — URINALYSIS, ROUTINE W REFLEX MICROSCOPIC
BILIRUBIN URINE: NEGATIVE
KETONES UR: NEGATIVE
LEUKOCYTES UA: NEGATIVE
Nitrite: NEGATIVE
PH: 6.5 (ref 5.0–8.0)
TOTAL PROTEIN, URINE-UPE24: NEGATIVE
UROBILINOGEN UA: 0.2 (ref 0.0–1.0)
Urine Glucose: NEGATIVE
WBC, UA: NONE SEEN (ref 0–?)

## 2017-09-20 MED ORDER — FLUTICASONE PROPIONATE 50 MCG/ACT NA SUSP: NASAL | 5 refills | Status: DC

## 2017-09-20 MED ORDER — MONTELUKAST SODIUM 10 MG PO TABS: 10.0000 mg | ORAL_TABLET | Freq: Every day | ORAL | 0 refills | Status: DC

## 2017-09-20 NOTE — Patient Instructions (Signed)
Please stop Afrin and start Flonase as needed 1-2 sprays 1x per day Please start Singulair at night  I will refer you to [facility] try Hydrogen peroxide for tonsil stone in throat  We will check urine today and consider vitamin D bloodwork in the future  Take Calcium 600 mg 2x per day and vit D3 1000 IU daily for bone health Think about Tdap vaccine and shingrix vaccine in the future We will refer for mammogram    Tdap Vaccine (Tetanus, Diphtheria and Pertussis): What You Need to Know 1. Why get vaccinated? Tetanus, diphtheria and pertussis are very serious diseases. Tdap vaccine can protect [place] from these diseases. And, Tdap vaccine given to pregnant women can protect newborn babies against pertussis. TETANUS (Lockjaw) is rare in the [place] today. It causes painful muscle tightening and stiffness, usually all over the body.  It can lead to tightening of muscles in the head and neck so you can't open your mouth, swallow, or sometimes even breathe. Tetanus kills about 1 out of 10 people who are infected even after receiving the best medical care.  DIPHTHERIA is also rare in the [place] today. It can cause a thick coating to form in the back of the throat.  It can lead to breathing problems, heart failure, paralysis, and death.  PERTUSSIS (Whooping Cough) causes severe coughing spells, which can cause difficulty breathing, vomiting and disturbed sleep.  It can also lead to weight loss, incontinence, and rib fractures. Up to 2 in 100 adolescents and 5 in 100 adults with pertussis are hospitalized or have complications, which could include pneumonia or death.  These diseases are caused by bacteria. Diphtheria and pertussis are spread from person to person through secretions from coughing or sneezing. Tetanus enters the body through cuts, scratches, or wounds. Before vaccines, as many as 200,000 cases of diphtheria, 200,000 cases of pertussis, and hundreds of cases of tetanus, were  reported in the [place] each year. Since vaccination began, reports of cases for tetanus and diphtheria have dropped by about 99% and for pertussis by about 80%. 2. Tdap vaccine Tdap vaccine can protect adolescents and adults from tetanus, diphtheria, and pertussis. One dose of Tdap is routinely given at age 80 or 88. People who did not get Tdap at that age should get it as soon as possible. Tdap is especially important for healthcare professionals and anyone having close contact with a baby younger than 12 months. Pregnant women should get a dose of Tdap during every pregnancy, to protect the newborn from pertussis. Infants are most at risk for severe, life-threatening complications from pertussis. Another vaccine, called Td, protects against tetanus and diphtheria, but not pertussis. A Td booster should be given every 10 years. Tdap may be given as one of these boosters if you have never gotten Tdap before. Tdap may also be given after a severe cut or burn to prevent tetanus infection. Your doctor or the person giving you the vaccine can give you more information. Tdap may safely be given at the same time as other vaccines. 3. Some people should not get this vaccine  A person who has ever had a life-threatening allergic reaction after a previous dose of any diphtheria, tetanus or pertussis containing vaccine, OR has a severe allergy to any part of this vaccine, should not get Tdap vaccine. Tell the person giving the vaccine about any severe allergies.  Anyone who had coma or long repeated seizures within 7 days after a childhood dose  of DTP or DTaP, or a previous dose of Tdap, should not get Tdap, unless a cause other than the vaccine was found. They can still get Td.  Talk to your doctor if you: ? have seizures or another nervous system problem, ? had severe pain or swelling after any vaccine containing diphtheria, tetanus or pertussis, ? ever had a condition called Guillain-Barr Syndrome  (GBS), ? aren't feeling well on the day the shot is scheduled. 4. Risks With any medicine, including vaccines, there is a chance of side effects. These are usually mild and go away on their own. Serious reactions are also possible but are rare. Most people who get Tdap vaccine do not have any problems with it. Mild problems following Tdap: (Did not interfere with activities)  Pain where the shot was given (about 3 in 4 adolescents or 2 in 3 adults)  Redness or swelling where the shot was given (about 1 person in 5)  Mild fever of at least 100.71F (up to about 1 in 25 adolescents or 1 in 100 adults)  Headache (about 3 or 4 people in 10)  Tiredness (about 1 person in 3 or 4)  Nausea, vomiting, diarrhea, stomach ache (up to 1 in 4 adolescents or 1 in 10 adults)  Chills, sore joints (about 1 person in 10)  Body aches (about 1 person in 3 or 4)  Rash, swollen glands (uncommon)  Moderate problems following Tdap: (Interfered with activities, but did not require medical attention)  Pain where the shot was given (up to 1 in 5 or 6)  Redness or swelling where the shot was given (up to about 1 in 16 adolescents or 1 in 12 adults)  Fever over 102F (about 1 in 100 adolescents or 1 in 250 adults)  Headache (about 1 in 7 adolescents or 1 in 10 adults)  Nausea, vomiting, diarrhea, stomach ache (up to 1 or 3 people in 100)  Swelling of the entire arm where the shot was given (up to about 1 in 500).  Severe problems following Tdap: (Unable to perform usual activities; required medical attention)  Swelling, severe pain, bleeding and redness in the arm where the shot was given (rare).  Problems that could happen after any vaccine:  People sometimes faint after a medical procedure, including vaccination. Sitting or lying down for about 15 minutes can help prevent fainting, and injuries caused by a fall. Tell your doctor if you feel dizzy, or have vision changes or ringing in the  ears.  Some people get severe pain in the shoulder and have difficulty moving the arm where a shot was given. This happens very rarely.  Any medication can cause a severe allergic reaction. Such reactions from a vaccine are very rare, estimated at fewer than 1 in a million doses, and would happen within a few minutes to a few hours after the vaccination. As with any medicine, there is a very remote chance of a vaccine causing a serious injury or death. The safety of vaccines is always being monitored. For more information, visit: http://www.aguilar.org/ 5. What if there is a serious problem? What should I look for? Look for anything that concerns you, such as signs of a severe allergic reaction, very high fever, or unusual behavior. Signs of a severe allergic reaction can include hives, swelling of the face and throat, difficulty breathing, a fast heartbeat, dizziness, and weakness. These would usually start a few minutes to a few hours after the vaccination. What should I do?  If you think it is a severe allergic reaction or other emergency that can't wait, call 9-1-1 or get the person to the nearest hospital. Otherwise, call your doctor.  Afterward, the reaction should be reported to the Vaccine Adverse Event Reporting System (VAERS). Your doctor might file this report, or you can do it yourself through the VAERS web site at www.vaers.SamedayNews.es, or by calling 910-018-7372. ? VAERS does not give medical advice. 6. The National Vaccine Injury Compensation Program The [employer] Vaccine Injury Compensation Program (VICP) is a federal program that was created to compensate people who may have been injured by certain vaccines. Persons who believe they may have been injured by a vaccine can learn about the program and about filing a claim by calling 470 324 7721 or visiting the [employer] website at GoldCloset.com.ee. There is a time limit to file a claim for compensation. 7. How can I  learn more?  Ask your doctor. He or she can give you the vaccine package insert or suggest other sources of information.  Call your local or state health department.  Contact the Centers for Disease Control and Prevention (CDC): ? Call 919-531-7410 (1-800-CDC-INFO) or ? Visit CDC's website at http://hunter.com/ CDC Tdap Vaccine VIS (12/17/13) This information is not intended to replace advice given to you by your health care provider. Make sure you discuss any questions you have with your health care provider. Document Released: 04/10/2012 Document Revised: 06/30/2016 Document Reviewed: 06/30/2016 Elsevier Interactive Patient Education  2017 [employer].    Allergies An allergy is when your body reacts to a substance in a way that is not normal. An allergic reaction can happen after you:  Eat something.  Breathe in something.  Touch something.  You can be allergic to:  Things that are only around during certain seasons, like molds and pollens.  Foods.  Drugs.  Insects.  Animal dander.  What are the signs or symptoms?  Puffiness (swelling). This may happen on the lips, face, tongue, mouth, or throat.  Sneezing.  Coughing.  Breathing loudly (wheezing).  Stuffy nose.  Tingling in the mouth.  A rash.  Itching.  Itchy, red, puffy areas of skin (hives).  Watery eyes.  Throwing up (vomiting).  Watery poop (diarrhea).  Dizziness.  Feeling faint or fainting.  Trouble breathing or swallowing.  A tight feeling in the chest.  A fast heartbeat. How is this diagnosed? Allergies can be diagnosed with:  A medical and family history.  Skin tests.  Blood tests.  A food diary. A food diary is a record of all the foods, drinks, and symptoms you have each day.  The results of an elimination diet. This diet involves making sure not to eat certain foods and then seeing what happens when you start eating them again.  How is this treated? There is no  cure for allergies, but allergic reactions can be treated with medicine. Severe reactions usually need to be treated at a hospital. How is this prevented? The best way to prevent an allergic reaction is to avoid the thing you are allergic to. Allergy shots and medicines can also help prevent reactions in some cases. This information is not intended to replace advice given to you by your health care provider. Make sure you discuss any questions you have with your health care provider. Document Released: 02/04/2013 Document Revised: 06/06/2016 Document Reviewed: 07/22/2014 Elsevier Interactive Patient Education  [employer].

## 2017-09-20 NOTE — Progress Notes (Signed)
Chief Complaint  Patient presents with  . Establish Care  . tonsil stone  . Allergies   Establish care  1. She reports fall allergies and using Afrin advised not to use chronically 2/2 development of tolerance. She reports nothing has helped nasal sx's. She is able to smell but feels discomfort around nose at times. She has tried nasal saline w/o relief also tried flonase but cant remember response. At times she feels sinus pressure and nose is stuffy and has drainage  2. White lesion on right tonsil she gets these periodically reportedly with allergy sx's. No sore throat on pain. It has been there x 2 months and she feels its there. When she was a kid her mother used to get them out for her with instrument but she feels like this one is too far back.  3. C/o right toenail fungus chronically tried topical did not work reviewed other options declines currently due to side effects  4. Reviewed DEXA 08/2011 +osteopenic T scores ranging -1.0 to -2.4. She is not taking any Ca or vit D reviewed.    Review of Systems  HENT: Positive for tinnitus. Negative for sinus pain and sore throat.        +chronic sinus issues and fall allergies sinus pressure at times and nasal drainage  Respiratory: Negative for shortness of breath.   Cardiovascular: Negative for chest pain.  Gastrointestinal: Negative for abdominal pain and constipation.  Genitourinary:       +overactive bladder and leakage a little   Musculoskeletal: Negative for joint pain.       +wrist pain  Denies elbow pain left   Skin:       +toenail fungus right great toe    Past Medical History:  Diagnosis Date  . Allergic rhinitis due to pollen   . Hyperlipidemia   . Osteopenia 11/12   T -1.8 in hip  . Sleep disturbance   . Vaginal atrophy    Past Surgical History:  Procedure Laterality Date  . ABDOMINAL HYSTERECTOMY    . APPENDECTOMY    . CATARACT EXTRACTION W/ INTRAOCULAR LENS  IMPLANT, BILATERAL    . COLONOSCOPY WITH PROPOFOL N/A  01/22/2016   Procedure: COLONOSCOPY WITH PROPOFOL;  Surgeon: Josefine Class, MD;  Location[facility];  Service: Endoscopy;  Laterality: N/A;  . ROTATOR CUFF REPAIR Right ~2001   Family History  Problem Relation Age of Onset  . Cancer Mother        CLL  . Cancer Father        colon  . Aneurysm Father   . Cancer Brother        colon cancer  . Heart disease Maternal Uncle   . Diabetes Neg Hx   . Breast cancer Neg Hx    Social History   Socioeconomic History  . Marital status: Married    Spouse name: Not on file  . Number of children: 0  . Years of education: Not on file  . Highest education level: Not on file  Social Needs  . Financial resource strain: Not on file  . Food insecurity - worry: Not on file  . Food insecurity - inability: Not on file  . Transportation needs - medical: Not on file  . Transportation needs - non-medical: Not on file  Occupational History  . Occupation: Scientist, water quality    Comment: Retired  Tobacco Use  . Smoking status: Former Research scientist (life sciences)  . Smokeless tobacco: Never Used  Substance and Sexual Activity  .  Alcohol use: Yes    Alcohol/week: 1.8 oz    Types: 3 Shots of liquor per week    Comment: drinks gin  . Drug use: No  . Sexual activity: No  Other Topics Concern  . Not on file  Social History Narrative   Has living will   Husband is health care POA.   Would accept resuscitation but no prolonged ventilation   Not sure about tube feeds   Moved her from [place]    Current Meds  Medication Sig  . diphenhydrAMINE (BENADRYL) 25 MG tablet Take 25 mg every 6 (six) hours as needed by mouth.  . eszopiclone (LUNESTA) 2 MG TABS tablet Take 1 tablet (2 mg total) by mouth at bedtime as needed.  Marland Kitchen oxymetazoline (AFRIN) 0.05 % nasal spray Place 1 spray into both nostrils 2 (two) times daily as needed.   . predniSONE (DELTASONE) 20 MG tablet 2 tabs po for 5 days, then 1 tab po for 5 days  . simvastatin (ZOCOR) 10 MG tablet  Take 10 mg by mouth once a week.   Allergies  Allergen Reactions  . Contrast Media [Iodinated Diagnostic Agents] Anaphylaxis   Recent Results (from the past 2160 hour(s))  TSH     Status: None   Collection Time: 07/10/17  2:33 PM  Result Value Ref Range   TSH 1.63 0.35 - 4.50 uIU/mL  Lipid Panel     Status: Abnormal   Collection Time: 07/10/17  2:33 PM  Result Value Ref Range   Cholesterol 191 0 - 200 mg/dL    Comment: ATP III Classification       Desirable:  < 200 mg/dL               Borderline High:  200 - 239 mg/dL          High:  > = 240 mg/dL   Triglycerides 77.0 0.0 - 149.0 mg/dL    Comment: Normal:  <150 mg/dLBorderline High:  150 - 199 mg/dL   HDL 68.50 >39.00 mg/dL   VLDL 15.4 0.0 - 40.0 mg/dL   LDL Cholesterol 107 (H) 0 - 99 mg/dL   Total CHOL/HDL Ratio 3     Comment:                Men          Women1/2 Average Risk     3.4          3.3Average Risk          5.0          4.42X Average Risk          9.6          7.13X Average Risk          15.0          11.0                       NonHDL 122.05     Comment: NOTE:  Non-HDL goal should be 30 mg/dL higher than patient's LDL goal (i.e. LDL goal of < 70 mg/dL, would have non-HDL goal of < 100 mg/dL)  Comprehensive metabolic panel     Status: Abnormal   Collection Time: 07/10/17  2:33 PM  Result Value Ref Range   Sodium 140 135 - 145 mEq/L   Potassium 4.2 3.5 - 5.1 mEq/L   Chloride 107 96 - 112 mEq/L   CO2 26 19 - 32 mEq/L  Glucose, Bld 100 (H) 70 - 99 mg/dL   BUN 15 6 - 23 mg/dL   Creatinine, Ser 0.78 0.40 - 1.20 mg/dL   Total Bilirubin 0.6 0.2 - 1.2 mg/dL   Alkaline Phosphatase 53 39 - 117 U/L   AST 19 0 - 37 U/L   ALT 9 0 - 35 U/L   Total Protein 6.9 6.0 - 8.3 g/dL   Albumin 4.2 3.5 - 5.2 g/dL   Calcium 9.7 8.4 - 10.5 mg/dL   GFR 75.71 >60.00 mL/min  CBC with Differential/Platelet     Status: None   Collection Time: 07/10/17  2:33 PM  Result Value Ref Range   WBC 8.1 4.0 - 10.5 K/uL   RBC 4.61 3.87 - 5.11 Mil/uL    Hemoglobin 14.4 12.0 - 15.0 g/dL   HCT 42.8 36.0 - 46.0 %   MCV 92.8 78.0 - 100.0 fl   MCHC 33.6 30.0 - 36.0 g/dL   RDW 13.8 11.5 - 15.5 %   Platelets 192.0 150.0 - 400.0 K/uL   Neutrophils Relative % 67.4 43.0 - 77.0 %   Lymphocytes Relative 21.2 12.0 - 46.0 %   Monocytes Relative 8.1 3.0 - 12.0 %   Eosinophils Relative 2.4 0.0 - 5.0 %   Basophils Relative 0.9 0.0 - 3.0 %   Neutro Abs 5.4 1.4 - 7.7 K/uL   Lymphs Abs 1.7 0.7 - 4.0 K/uL   Monocytes Absolute 0.7 0.1 - 1.0 K/uL   Eosinophils Absolute 0.2 0.0 - 0.7 K/uL   Basophils Absolute 0.1 0.0 - 0.1 K/uL  Urinalysis, Routine w reflex microscopic     Status: Abnormal   Collection Time: 09/20/17 11:21 AM  Result Value Ref Range   Color, Urine YELLOW Yellow;Lt. Yellow   APPearance CLEAR Clear   Specific Gravity, Urine <=1.005 (A) 1.000 - 1.030   pH 6.5 5.0 - 8.0   Total Protein, Urine NEGATIVE Negative   Urine Glucose NEGATIVE Negative   Ketones, ur NEGATIVE Negative   Bilirubin Urine NEGATIVE Negative   Hgb urine dipstick TRACE-INTACT (A) Negative   Urobilinogen, UA 0.2 0.0 - 1.0   Leukocytes, UA NEGATIVE Negative   Nitrite NEGATIVE Negative   WBC, UA none seen 0-2/hpf   RBC / HPF 0-2/hpf 0-2/hpf   Squamous Epithelial / LPF Rare(0-4/hpf) Rare(0-4/hpf)   Objective  Body mass index is 22.83 kg/m. Wt Readings from Last 3 Encounters:  09/20/17 133 lb (60.3 kg)  09/04/17 130 lb 4 oz (59.1 kg)  07/18/17 129 lb 12 oz (58.9 kg)   Temp Readings from Last 3 Encounters:  09/20/17 98.1 F (36.7 C) (Oral)  09/04/17 98.3 F (36.8 C) (Oral)  07/18/17 98 F (36.7 C) (Oral)   BP Readings from Last 3 Encounters:  09/20/17 (!) 118/54  09/04/17 130/64  07/18/17 126/64   Pulse Readings from Last 3 Encounters:  09/20/17 73  09/04/17 66  07/18/17 72   Pulse oximetry on room air is 99% Physical Exam  Constitutional: She is oriented to person, place, and time and well-developed, well-nourished, and in no distress. Vital signs  are normal.  HENT:  Head: Normocephalic and atraumatic.  Mouth/Throat: Oropharynx is clear and moist and mucous membranes are normal.  Eyes: Conjunctivae are normal. Pupils are equal, round, and reactive to light.  Cardiovascular: Normal rate, regular rhythm and normal heart sounds.  No murmur heard. Pulmonary/Chest: Effort normal and breath sounds normal.  Abdominal: Soft. Bowel sounds are normal. There is no tenderness.  Neurological: She is alert  and oriented to person, place, and time. Gait normal.  Skin: Skin is warm and dry.  Psychiatric: Mood, memory, affect and judgment normal.  Nursing note and vitals reviewed.  Assessment   1. Allergies/allergic rhinitis  2. Tonsil stone right  3. Onychomycosis right great toenail  4. Osteopenia per DEXA 08/2011  5. HM 6. Overactive bladder and urgency sx's  Plan  1.  D/c afrin  Add singulair and flonase prn. Prev. Nasal saline not helpful  2.  Disc oxidizing mouthwash can try Hydrogen peroxide gargles but spit out  Will refer to [facility] Dr. Tami Ribas or [place] to see if they can help remove  3.  Declines tx with oral meds  4.  Consider check vitamin D and repeat DEXA in future  rec calcium 600 mg bid and vit D 1000 IU qd  5.  Check UA today has not been done in a while  Given Rx Tdap vx today and shingrix vx  Had flu and UTD pna vx's x 2  Had zostavax as well   Referred for mammogram today  Pap--no h/o abnormal per pt h/o endometriosis s/p total hysterectomy   Consider repeat DEXA last had 08/2011 +osteopenia and need to check vit D in future   01/22/16 Dr. Rayann Heman h/o colon polpys.  colonoscopy was difficult per report and she didn't like the MD who did the procedure. Reviewed +IH and adhesions making exam difficult.  cologuard done 02/03/16 repeat due 02/03/2019 until age 58 reviewed with pt ok with this plan  Saw dermatology on [address] 06/16/17 had [place] [facility]2 f/u in 1 year   She is former smoker quit >15 years ago smoked 1 ppd  x 35 yeas no FH lung cancer  -consider CT low dose if insurance will cover in future   Cont exercise 3x per week  6. Disc kegels will try. Also disc meds  Declines for now  Also disc sch bathroom times    Reviewed labs from 07/10/17 [facility], LDL 107, CBC normal ,TSH normal  Provider: Dr. Olivia Mackie McLean-Scocuzza

## 2017-09-22 ENCOUNTER — Telehealth: Payer: Self-pay

## 2017-09-22 NOTE — Telephone Encounter (Signed)
Patient advised of result and verbalized an understanding.  

## 2017-09-22 NOTE — Telephone Encounter (Signed)
-----   Message from Delorise Jackson, MD sent at 09/20/2017  4:51 PM EST ----- Advise pt urine had trace blood at f/u we need to repeat if this continues will need to have her see a specialist to work up   Also reviewed bone density scan +osteopenia bone loss in 2012 likely need to check vitamin D and repeat bone density scan in future we can discuss at f/u in 3 months  Thanks tMS

## 2017-09-26 ENCOUNTER — Telehealth: Payer: Self-pay | Admitting: Internal Medicine

## 2017-09-26 DIAGNOSIS — Z1231 Encounter for screening mammogram for malignant neoplasm of breast: Secondary | ICD-10-CM

## 2017-09-26 NOTE — Telephone Encounter (Signed)
Corrected order

## 2017-09-26 NOTE — Telephone Encounter (Signed)
Correction IMG [id number]. Thank you!

## 2017-09-26 NOTE — Telephone Encounter (Signed)
Sorry Correction IMG [id number]. Thanks

## 2017-09-26 NOTE — Telephone Encounter (Signed)
Order needs to read [facility] IMG [phone]. Please and thank you!

## 2017-10-26 DIAGNOSIS — J358 Other chronic diseases of tonsils and adenoids: Secondary | ICD-10-CM | POA: Diagnosis not present

## 2017-11-07 ENCOUNTER — Ambulatory Visit
Admission: RE | Admit: 2017-11-07 | Discharge: 2017-11-07 | Disposition: A | Discharge status: Home / Self Care | Payer: Medicare Other | Source: Ambulatory Visit | Attending: Internal Medicine | Admitting: Internal Medicine

## 2017-11-07 DIAGNOSIS — Z1231 Encounter for screening mammogram for malignant neoplasm of breast: Secondary | ICD-10-CM | POA: Diagnosis not present

## 2017-11-09 ENCOUNTER — Other Ambulatory Visit: Payer: Self-pay

## 2017-11-09 ENCOUNTER — Ambulatory Visit (INDEPENDENT_AMBULATORY_CARE_PROVIDER_SITE_OTHER): Payer: Medicare Other | Admitting: Family Medicine

## 2017-11-09 VITALS — BP 120/60 | HR 62 | Temp 98.4°F | Ht 63.5 in | Wt 132.0 lb

## 2017-11-09 DIAGNOSIS — M778 Other enthesopathies, not elsewhere classified: Secondary | ICD-10-CM | POA: Diagnosis not present

## 2017-11-09 DIAGNOSIS — M779 Enthesopathy, unspecified: Principal | ICD-10-CM

## 2017-11-09 NOTE — Progress Notes (Signed)
Dr. Frederico Hamman T. Dalten Ambrosino, MD, [facility] [place], [address] Phone: [phone] Fax: [phone]  11/09/2017  Patient: Annette Lee, MRN: [id number], DOB: October 16, 1936, 82 y.o.  Primary Physician:  McLean-Scocuzza, Nino Glow, MD   Chief Complaint  Patient presents with  . Follow-up    Right Wrist   Subjective:   Annette Lee is a 82 y.o. very pleasant female patient who presents with the following:  Ongoing R wrist pain - last seen on 09/04/2017.  S/p PT and oral steroids. Pain since the summer.  The last time I saw her, thought that she was having some pain primarily in the fifth and sixth dorsal compartments of the right hand, and it does seem to be somewhat better now, but she is not asymptomatic.   Past Medical History, Surgical History, Social History, Family History, Problem List, Medications, and Allergies have been reviewed and updated if relevant.  Patient Active Problem List   Diagnosis Date Noted  . Overactive bladder 09/20/2017  . History of colonic polyps 12/17/2015  . Hyperlipidemia   . Allergic rhinitis due to pollen   . Sleep disturbance     Past Medical History:  Diagnosis Date  . Allergic rhinitis due to pollen   . Endometriosis   . Hyperlipidemia   . Osteopenia 11/12   T -1.8 in hip  . Sleep disturbance   . Vaginal atrophy     Past Surgical History:  Procedure Laterality Date  . ABDOMINAL HYSTERECTOMY     total   . APPENDECTOMY    . CATARACT EXTRACTION W/ INTRAOCULAR LENS  IMPLANT, BILATERAL    . COLONOSCOPY WITH PROPOFOL N/A 01/22/2016   Procedure: COLONOSCOPY WITH PROPOFOL;  Surgeon: Josefine Class, MD;  Location: [facility] ENDOSCOPY;  Service: Endoscopy;  Laterality: N/A;  . ROTATOR CUFF REPAIR Right ~2001    Social History   Socioeconomic History  . Marital status: Married    Spouse name: Not on file  . Number of children: 0  . Years of education: Not on file  . Highest  education level: Not on file  Social Needs  . Financial resource strain: Not on file  . Food insecurity - worry: Not on file  . Food insecurity - inability: Not on file  . Transportation needs - medical: Not on file  . Transportation needs - non-medical: Not on file  Occupational History  . Occupation: Scientist, water quality    Comment: Retired  Tobacco Use  . Smoking status: Former Research scientist (life sciences)  . Smokeless tobacco: Never Used  . Tobacco comment: as of 08/2017 quit smoking >15 years ago 1ppd x 35 years no FH lung cancer   Substance and Sexual Activity  . Alcohol use: Yes    Alcohol/week: 1.8 oz    Types: 3 Shots of liquor per week    Comment: drinks gin  . Drug use: No  . Sexual activity: No  Other Topics Concern  . Not on file  Social History Narrative   Has living will   Husband is health care POA.   Would accept resuscitation but no prolonged ventilation   Not sure about tube feeds   Moved her from [place]    Exercises 3x per week     Family History  Problem Relation Age of Onset  . Cancer Mother        CLL  . Cancer Father        colon  . Aneurysm Father   .  Cancer Brother        colon cancer  . Heart disease Maternal Uncle   . Diabetes Neg Hx   . Breast cancer Neg Hx     Allergies  Allergen Reactions  . Contrast Media [Iodinated Diagnostic Agents] Anaphylaxis    Medication list reviewed and updated in full in [place].  GEN: No fevers, chills. Nontoxic. Primarily MSK c/o today. MSK: Detailed in the HPI GI: tolerating PO intake without difficulty Neuro: No numbness, parasthesias, or tingling associated. Otherwise the pertinent positives of the ROS are noted above.   Objective:   BP 120/60   Pulse 62   Temp 98.4 F (36.9 C) (Oral)   Ht 5' 3.5" (1.613 m)   Wt 132 lb (59.9 kg)   BMI 23.02 kg/m    GEN: WDWN, NAD, Non-toxic, Alert & Oriented x 3 HEENT: Atraumatic, Normocephalic.  Ears and Nose: No external deformity. EXTR: No  clubbing/cyanosis/edema NEURO: Normal gait.  PSYCH: Normally interactive. Conversant. Not depressed or anxious appearing.  Calm demeanor.   R hand Ecchymosis or edema: neg ROM wrist/hand/digits: full  Carpals, MCP's, digits: tender with extension at PIP of the 5th. Distal Ulna and Radius: NT Ecchymosis or edema: neg No instability Cysts/nodules: neg Digit triggering: neg Finkelstein's test: neg Snuffbox tenderness: neg Scaphoid tubercle: NT Resisted supination: NT Full composite fist, no malrotation Grip, all digits: 5/5 str DIPJT: NT PIP JT: NT MCP JT: NT No tenosynovitis Axial load test: neg Phalen's: neg Tinel's: neg Atrophy: neg  Hand sensation: intact   Radiology:  Assessment and Plan:   Tendinitis of extensor tendon of right hand  Overall, it is improved.  He does still isolate to the fifth digit with extension, primarily at the PIP.  Offered to try to isolate this and do fifth extensor tendon sheath injection, but the patient was fairly okay with leaving things as they are.  She is just going to follow-up as needed.  Follow-up: No Follow-up on file.  Signed,  Maud Deed. Yina Riviere, MD   Allergies as of 11/09/2017      Reactions   Contrast Media [iodinated Diagnostic Agents] Anaphylaxis      Medication List        Accurate as of 11/09/17 11:59 PM. Always use your most recent med list.          CALCIUM 600 PO Take 1 tablet by mouth daily.   eszopiclone 2 MG Tabs tablet Commonly known as:  LUNESTA Take 1 tablet (2 mg total) by mouth at bedtime as needed.   fluticasone 50 MCG/ACT nasal spray Commonly known as:  FLONASE 1-2 sprays each nose once daily. STOP AFRIN   simvastatin 10 MG tablet Commonly known as:  ZOCOR Take 10 mg by mouth once a week.   VITAMIN D3 PO Take 2 capsules by mouth daily.

## 2017-11-10 ENCOUNTER — Encounter: Payer: Self-pay | Admitting: Family Medicine

## 2017-12-21 ENCOUNTER — Encounter: Payer: Self-pay | Admitting: Internal Medicine

## 2017-12-21 ENCOUNTER — Ambulatory Visit (INDEPENDENT_AMBULATORY_CARE_PROVIDER_SITE_OTHER): Payer: Medicare Other | Admitting: Internal Medicine

## 2017-12-21 VITALS — BP 128/60 | HR 85 | Temp 98.4°F | Ht 64.0 in | Wt 133.6 lb

## 2017-12-21 DIAGNOSIS — L989 Disorder of the skin and subcutaneous tissue, unspecified: Secondary | ICD-10-CM | POA: Diagnosis not present

## 2017-12-21 DIAGNOSIS — G4700 Insomnia, unspecified: Secondary | ICD-10-CM | POA: Diagnosis not present

## 2017-12-21 DIAGNOSIS — M8580 Other specified disorders of bone density and structure, unspecified site: Secondary | ICD-10-CM

## 2017-12-21 DIAGNOSIS — R319 Hematuria, unspecified: Secondary | ICD-10-CM | POA: Diagnosis not present

## 2017-12-21 DIAGNOSIS — J301 Allergic rhinitis due to pollen: Secondary | ICD-10-CM

## 2017-12-21 DIAGNOSIS — M810 Age-related osteoporosis without current pathological fracture: Secondary | ICD-10-CM | POA: Insufficient documentation

## 2017-12-21 LAB — URINALYSIS, ROUTINE W REFLEX MICROSCOPIC
BILIRUBIN URINE: NEGATIVE
Ketones, ur: NEGATIVE
LEUKOCYTES UA: NEGATIVE
Nitrite: NEGATIVE
Specific Gravity, Urine: <=1.005 — AB (ref 1.000–1.030)
Total Protein, Urine: NEGATIVE
Urine Glucose: NEGATIVE
Urobilinogen, UA: 0.2 (ref 0.0–1.0)
pH: 7 (ref 5.0–8.0)

## 2017-12-21 MED ORDER — ESZOPICLONE 2 MG PO TABS: 2.0000 mg | ORAL_TABLET | Freq: Every evening | ORAL | 2 refills | Status: DC | PRN

## 2017-12-21 NOTE — Patient Instructions (Addendum)
Refer to dermatology on [facility] asap  F/u in 3 months    Hematuria, Adult Hematuria is blood in your urine. It can be caused by a bladder infection, kidney infection, prostate infection, kidney stone, or cancer of your urinary tract. Infections can usually be treated with medicine, and a kidney stone usually will pass through your urine. If neither of these is the cause of your hematuria, further workup to find out the reason may be needed. It is very important that you tell your health care provider about any blood you see in your urine, even if the blood stops without treatment or happens without causing pain. Blood in your urine that happens and then stops and then happens again can be a symptom of a very serious condition. Also, pain is not a symptom in the initial stages of many urinary cancers. Follow these instructions at home:  Drink lots of fluid, 3-4 quarts a day. If you have been diagnosed with an infection, cranberry juice is especially recommended, in addition to large amounts of water.  Avoid caffeine, tea, and carbonated beverages because they tend to irritate the bladder.  Avoid alcohol because it may irritate the prostate.  Take all medicines as directed by your health care provider.  If you were prescribed an antibiotic medicine, finish it all even if you start to feel better.  If you have been diagnosed with a kidney stone, follow your health care provider's instructions regarding straining your urine to catch the stone.  Empty your bladder often. Avoid holding urine for long periods of time.  After a bowel movement, women should cleanse front to back. Use each tissue only once.  Empty your bladder before and after sexual intercourse if you are a female. Contact a health care provider if:  You develop back pain.  You have a fever.  You have a feeling of sickness in your stomach (nausea) or vomiting.  Your symptoms are not better in 3 days. Return sooner if you  are getting worse. Get help right away if:  You develop severe vomiting and are unable to keep the medicine down.  You develop severe back or abdominal pain despite taking your medicines.  You begin passing a large amount of blood or clots in your urine.  You feel extremely weak or faint, or you pass out. This information is not intended to replace advice given to you by your health care provider. Make sure you discuss any questions you have with your health care provider. Document Released: 10/10/2005 Document Revised: 03/17/2016 Document Reviewed: 06/10/2013 Elsevier Interactive Patient Education  2017 [place] Carcinoma Basal cell carcinoma is the most common form of skin cancer. It begins in the basal cells, which are at the bottom of the outer skin layer (epidermis). It occurs most often on parts of the body that are frequently exposed to the sun, such as:  Parts of the head, including the scalp or face.  Ears.  Neck.  Arms or legs.  Backs of the hands.  Basal cell carcinoma can almost always be cured. It rarely spreads to other areas of the body (metastasizes). Basal cell carcinoma may come back at the same location (recur), but it can be treated again if this occurs. What are the causes? This condition is usually caused by exposure to ultraviolet (UV) light. UV light may come from the sun or from tanning beds. Other causes include:  Exposure to arsenic.  Exposure to radiation.  Exposure  to toxic tars and oils.  Certain genetic conditions, such as xeroderma pigmentosum.  What increases the risk? This condition is more likely to develop in:  People who are older than 80 years of age.  People who have fair skin (light complexion).  People who have blonde or red hair.  People who have blue, green, or gray eyes.  People who have childhood freckling.  People who have had sun exposure over long periods of time, especially during  childhood.  People who have had repeated sunburns.  People who have a weakened immune system.  People who have been exposed to certain chemicals, such as tar, soot, and arsenic.  People who have chronic inflammatory conditions.  People who have chronic infections.  People who use tanning beds.  What are the signs or symptoms? The main symptom of this condition is a growth or lesion on the skin. The shape and color of the growth or lesion may vary. The five main types include:  An open sore that may remain open for 3 weeks or longer. The sore may bleed or crust. This type of lesion can be an early sign of basal cell carcinoma. Basal cell carcinoma often shows up as a sore that does not heal.  A reddish area that may crust, itch, or cause discomfort. This may occur on areas that are exposed to the sun. These patches might be easier to feel than to see.  A shiny or clear bump that is red, white, or pink. In people who have dark hair, the bump is often tan, black, or brown. These bumps can look like moles.  A pink growth with a raised border. The growth will have a crusted and indented area in the center. Small blood vessels may appear on the surface of the growth as it gets bigger.  A scarlike area that looks like shiny, stretched skin. The area may be white, yellow, or waxy. It often has irregular borders. This may be a sign of more aggressive basal cell carcinoma.  How is this diagnosed? This condition may be diagnosed with:  A physical exam.  Removal of a tissue sample to be examined under a microscope (biopsy).  How is this treated? Treatment for this condition involves removing the cancerous tissue. The method that is used for this depends on the type, size, location, and number of tumors. Possible treatments include:  Mohs surgery. In this procedure, the cancerous skin cells are removed layer by layer until all of the tumor has been removed.  Surgical removal (excision) of  the tumor. This involves removing the entire tumor and a small amount of normal skin that surrounds it.  Cryosurgery. This involves freezing the tumor with liquid nitrogen.  Plastic surgery. The tumor is removed, and healthy skin from another part of the body is used to cover the wound. This may be done for large tumors that are in areas where it is not possible to stretch the nearby skin to sew the edges of the wound together.  Radiation. This may be used for tumors on the face.  Photodynamic therapy. A chemical cream is applied to the skin, and light exposure is used to activate the chemical.  Electrodesiccation and curettage. This involves alternately scraping and burning the tumor while using an electric current to control bleeding.  Chemical treatments, such as imiquimod cream and interferon injections. These may be used to remove superficial tumors with minimal scarring.  Follow these instructions at home:  Avoid unprotected sun exposure.  Do self-exams as told by your health care provider. Look for new spots or changes in your skin.  Keep all follow-up visits as told by your health care provider. This is important. How is this prevented?  Avoid the sun when it is the strongest. This is usually between 10:00 a.m. and 4:00 p.m.  When you are out in the sun, use a sunscreen that has a sun protection factor (SPF) of at least 68.  Apply sunscreen at least 30 minutes before exposure to the sun.  Reapply sunscreen every 2-4 hours while you are outside. Also reapply it after swimming and after excessive sweating.  Always wear hats, protective clothing, and UV-blocking sunglasses when you are outdoors.  Do not use tanning beds. Contact a health care provider if:  You notice any new spots or any changes in your skin.  You have had a basal cell carcinoma tumor removed and you notice a new growth in the same location. This information is not intended to replace advice given to you  by your health care provider. Make sure you discuss any questions you have with your health care provider. Document Released: 04/16/2003 Document Revised: 03/17/2016 Document Reviewed: 02/02/2015 Elsevier Interactive Patient Education  [employer].

## 2017-12-21 NOTE — Progress Notes (Signed)
Pre visit review using our clinic review tool, if applicable. No additional management support is needed unless otherwise documented below in the visit note. 

## 2017-12-22 LAB — URINE CULTURE
MICRO NUMBER:: 90262225
Result:: NO GROWTH
SPECIMEN QUALITY:: ADEQUATE

## 2017-12-24 ENCOUNTER — Encounter: Payer: Self-pay | Admitting: Internal Medicine

## 2017-12-24 NOTE — Progress Notes (Signed)
Chief Complaint  Patient presents with  . Follow-up   Follow up  1. C/o bump on forehead new x 2 months previously seen [place] dermatology  2. F/u blood in urine will repeat urine today  3. Insomnia she wants refill of lunesta today  4. Allergic rhinitis prn Flonase and antihistamine. She brings in piece of paper today husband just had allergy testing IgE and allergy testing on back with allergist. She has not seen allergist or ENT yet.     Review of Systems  Constitutional: Negative for weight loss.  HENT: Negative for hearing loss.   Eyes: Negative for blurred vision.  Respiratory: Negative for shortness of breath.   Cardiovascular: Negative for chest pain.  Gastrointestinal: Negative for abdominal pain.  Genitourinary:       +blood in urine   Musculoskeletal: Negative for falls.  Skin:       +bump on forehead   Neurological: Negative for headaches.  Psychiatric/Behavioral: Positive for memory loss.   Past Medical History:  Diagnosis Date  . Allergic rhinitis due to pollen   . Endometriosis   . Hyperlipidemia   . Osteopenia 11/12   T -1.8 in hip  . Sleep disturbance   . Vaginal atrophy    Past Surgical History:  Procedure Laterality Date  . ABDOMINAL HYSTERECTOMY     total   . APPENDECTOMY    . CATARACT EXTRACTION W/ INTRAOCULAR LENS  IMPLANT, BILATERAL    . COLONOSCOPY WITH PROPOFOL N/A 01/22/2016   Procedure: COLONOSCOPY WITH PROPOFOL;  Surgeon: Josefine Class, MD;  Location: [facility] ENDOSCOPY;  Service: Endoscopy;  Laterality: N/A;  . ROTATOR CUFF REPAIR Right ~2001   Family History  Problem Relation Age of Onset  . Cancer Mother        CLL  . Cancer Father        colon  . Aneurysm Father   . Cancer Brother        colon cancer  . Heart disease Maternal Uncle   . Diabetes Neg Hx   . Breast cancer Neg Hx    Social History   Socioeconomic History  . Marital status: Married    Spouse name: Not on file  . Number of children: 0  . Years of education:  Not on file  . Highest education level: Not on file  Social Needs  . Financial resource strain: Not on file  . Food insecurity - worry: Not on file  . Food insecurity - inability: Not on file  . Transportation needs - medical: Not on file  . Transportation needs - non-medical: Not on file  Occupational History  . Occupation: Scientist, water quality    Comment: Retired  Tobacco Use  . Smoking status: Former Research scientist (life sciences)  . Smokeless tobacco: Never Used  . Tobacco comment: as of 08/2017 quit smoking >15 years ago 1ppd x 35 years no FH lung cancer   Substance and Sexual Activity  . Alcohol use: Yes    Alcohol/week: 1.8 oz    Types: 3 Shots of liquor per week    Comment: drinks gin  . Drug use: No  . Sexual activity: No  Other Topics Concern  . Not on file  Social History Narrative   Has living will   Husband is health care POA.   Would accept resuscitation but no prolonged ventilation   Not sure about tube feeds   Moved her from [place]    Exercises 3x per week    Current Meds  Medication Sig  .  Calcium Carbonate (CALCIUM 600 PO) Take 1 tablet by mouth daily.  . Cholecalciferol (VITAMIN D3 PO) Take 2 capsules by mouth daily.  . eszopiclone (LUNESTA) 2 MG TABS tablet Take 1 tablet (2 mg total) by mouth at bedtime as needed.  . fluticasone (FLONASE) 50 MCG/ACT nasal spray 1-2 sprays each nose once daily. STOP AFRIN  . simvastatin (ZOCOR) 10 MG tablet Take 10 mg by mouth once a week.  . [DISCONTINUED] eszopiclone (LUNESTA) 2 MG TABS tablet Take 1 tablet (2 mg total) by mouth at bedtime as needed.   Allergies  Allergen Reactions  . Contrast Media [Iodinated Diagnostic Agents] Anaphylaxis   Recent Results (from the past 2160 hour(s))  Urinalysis, Routine w reflex microscopic     Status: Abnormal   Collection Time: 12/21/17 10:04 AM  Result Value Ref Range   Color, Urine YELLOW Yellow;Lt. Yellow   APPearance CLEAR Clear   Specific Gravity, Urine <=1.005 (A) 1.000 -  1.030   pH 7.0 5.0 - 8.0   Total Protein, Urine NEGATIVE Negative   Urine Glucose NEGATIVE Negative   Ketones, ur NEGATIVE Negative   Bilirubin Urine NEGATIVE Negative   Hgb urine dipstick TRACE-INTACT (A) Negative   Urobilinogen, UA 0.2 0.0 - 1.0   Leukocytes, UA NEGATIVE Negative   Nitrite NEGATIVE Negative   WBC, UA 0-2/hpf 0-2/hpf   RBC / HPF 0-2/hpf 0-2/hpf   Squamous Epithelial / LPF Rare(0-4/hpf) Rare(0-4/hpf)  Urine Culture     Status: None   Collection Time: 12/21/17 10:04 AM  Result Value Ref Range   MICRO NUMBER: [id number]    SPECIMEN QUALITY: ADEQUATE    Sample Source NOT GIVEN    STATUS: FINAL    Result: No Growth    Objective  Body mass index is 22.93 kg/m. Wt Readings from Last 3 Encounters:  12/21/17 133 lb 9 oz (60.6 kg)  11/09/17 132 lb (59.9 kg)  09/20/17 133 lb (60.3 kg)   Temp Readings from Last 3 Encounters:  12/21/17 98.4 F (36.9 C) (Oral)  11/09/17 98.4 F (36.9 C) (Oral)  09/20/17 98.1 F (36.7 C) (Oral)   BP Readings from Last 3 Encounters:  12/21/17 128/60  11/09/17 120/60  09/20/17 (!) 118/54   Pulse Readings from Last 3 Encounters:  12/21/17 85  11/09/17 62  09/20/17 73   O2 sat room air 98%  Physical Exam  Constitutional: She is oriented to person, place, and time and well-developed, well-nourished, and in no distress. Vital signs are normal.  HENT:  Head: Normocephalic and atraumatic.    Mouth/Throat: Oropharynx is clear and moist and mucous membranes are normal.  Eyes: Conjunctivae are normal. Pupils are equal, round, and reactive to light.  Cardiovascular: Normal rate, regular rhythm and normal heart sounds.  Pulmonary/Chest: Effort normal and breath sounds normal.  Neurological: She is alert and oriented to person, place, and time. Gait normal. Gait normal.  Skin: Skin is warm, dry and intact.  0.2-0.3 cm lesion to mid forehead with telangiectasia c/w bcc   Psychiatric: Mood, affect and judgment normal. She exhibits  abnormal recent memory.  Short term memory loss on exam   Nursing note and vitals reviewed.   Assessment   1. R/o NMSC forehead  2. Hematuria  3. Insomnia  4. Allergic rhinitis  5. HM Plan  1. Refer to [facility] dermatology tbse and spot check  2. UA and culture today  If neg culture will do CT ab/pelvis with and w/o contrast if neg refer urology to w/u  3. Refilled lunesta today  4. On flonase and prn antihistamine  Pt does not want ENT or allergy testing IgE for now   5.  Had flu and pna vaccines x 2  Had zostervax  Still has Rx Tdap and shingrix has not had yet  Out of pap window s/p total hysterectomy  mammo neg 11/07/17  Consider dexa in future 08/31/11 +osteopenia need to check vitamin D in future  Colonoscopy  01/22/16 Dr. Rayann Heman h/o colon polpys.  colonoscopy was difficult per report and she didn't like the MD who did the procedure. Reviewed +IH and adhesions making exam difficult.  cologuard done 02/03/16 repeat due 02/03/2019 until age 59 reviewed with pt ok with this plan  Saw dermatology on [place] 06/16/17 had [place] LN2 f/u in 1 year but will refer now due to #1   She is former smoker quit >15 years ago smoked 1 ppd x 35 yeas no FH lung cancer  -consider CT low dose if insurance will cover in future   Cont exercise 3x per week   Provider: Dr. Olivia Mackie McLean-Scocuzza-Internal Medicine

## 2017-12-26 DIAGNOSIS — D485 Neoplasm of uncertain behavior of skin: Secondary | ICD-10-CM | POA: Diagnosis not present

## 2017-12-26 DIAGNOSIS — C44319 Basal cell carcinoma of skin of other parts of face: Secondary | ICD-10-CM | POA: Diagnosis not present

## 2017-12-27 ENCOUNTER — Other Ambulatory Visit: Payer: Self-pay | Admitting: Internal Medicine

## 2017-12-27 DIAGNOSIS — R319 Hematuria, unspecified: Secondary | ICD-10-CM

## 2018-01-02 ENCOUNTER — Other Ambulatory Visit: Payer: Self-pay | Admitting: Internal Medicine

## 2018-01-02 ENCOUNTER — Ambulatory Visit
Admission: RE | Admit: 2018-01-02 | Discharge: 2018-01-02 | Disposition: A | Discharge status: Home / Self Care | Payer: Medicare Other | Source: Ambulatory Visit | Attending: Internal Medicine | Admitting: Internal Medicine

## 2018-01-02 DIAGNOSIS — R319 Hematuria, unspecified: Secondary | ICD-10-CM

## 2018-01-02 DIAGNOSIS — K8020 Calculus of gallbladder without cholecystitis without obstruction: Secondary | ICD-10-CM | POA: Diagnosis not present

## 2018-01-03 NOTE — Progress Notes (Signed)
Pt had CT done on 01/02/18.

## 2018-01-05 ENCOUNTER — Telehealth: Payer: Self-pay | Admitting: Internal Medicine

## 2018-01-05 NOTE — Telephone Encounter (Signed)
Copied from [place] [id number]. Topic: Quick Communication - Lab Results >> Jan 05, 2018 10:52 AM Johna Sheriff, CMA wrote: Called patient to inform them of  lab results. When patient returns call, triage nurse may/not disclose results.

## 2018-01-05 NOTE — Telephone Encounter (Signed)
Charted in result notes. 

## 2018-01-09 ENCOUNTER — Encounter: Payer: Self-pay | Admitting: Family Medicine

## 2018-01-09 ENCOUNTER — Ambulatory Visit (INDEPENDENT_AMBULATORY_CARE_PROVIDER_SITE_OTHER): Payer: Medicare Other | Admitting: Family Medicine

## 2018-01-09 DIAGNOSIS — R3121 Asymptomatic microscopic hematuria: Secondary | ICD-10-CM

## 2018-01-09 DIAGNOSIS — L900 Lichen sclerosus et atrophicus: Secondary | ICD-10-CM

## 2018-01-09 DIAGNOSIS — R319 Hematuria, unspecified: Secondary | ICD-10-CM | POA: Insufficient documentation

## 2018-01-09 HISTORY — DX: Hematuria, unspecified: R31.9

## 2018-01-09 NOTE — Assessment & Plan Note (Signed)
No GYN evidence for bleeding, f/u per PCP.

## 2018-01-09 NOTE — Assessment & Plan Note (Signed)
Does not need treatment for this but if symptoms occur, would consider temovate 2x/wk

## 2018-01-09 NOTE — Progress Notes (Signed)
   Subjective:    Patient ID: Annette Lee is a 82 y.o. female presenting with NEW GYN  on 01/09/2018  HPI: Here from [place]. Has h/o endometriosis and had a hysterectomy. She comes in today because her PCP found blood in her urine and she would like to rule out GYN causes as the reason for the blood in her urine.  Review of Systems  Constitutional: Negative for chills and fever.  Respiratory: Negative for shortness of breath.   Cardiovascular: Negative for chest pain.  Gastrointestinal: Negative for abdominal pain, nausea and vomiting.  Genitourinary: Negative for dysuria.  Skin: Negative for rash.      Objective:    BP 128/80   Pulse 75   Wt 135 lb (61.2 kg)   BMI 23.17 kg/m  Physical Exam  Constitutional: She is oriented to person, place, and time. She appears well-developed and well-nourished. No distress.  HENT:  Head: Normocephalic and atraumatic.  Eyes: No scleral icterus.  Neck: Neck supple.  Cardiovascular: Normal rate.  Pulmonary/Chest: Effort normal.  Abdominal: Soft.  Neurological: She is alert and oriented to person, place, and time.  Skin: Skin is warm and dry.  Psychiatric: She has a normal mood and affect.        Assessment & Plan:   Problem List Items Addressed This Visit      Unprioritized   Lichen sclerosus    Does not need treatment for this but if symptoms occur, would consider temovate 2x/wk      Hematuria    No GYN evidence for bleeding, f/u per PCP.         Total face-to-face time with patient: 20 minutes. Over 50% of encounter was spent on counseling and coordination of care. Return if symptoms worsen or fail to improve.   Donnamae Jude 01/09/2018 3:35 PM

## 2018-01-09 NOTE — Patient Instructions (Signed)

## 2018-01-15 ENCOUNTER — Encounter: Payer: Self-pay | Admitting: Internal Medicine

## 2018-01-15 ENCOUNTER — Ambulatory Visit (INDEPENDENT_AMBULATORY_CARE_PROVIDER_SITE_OTHER): Payer: Medicare Other | Admitting: Internal Medicine

## 2018-01-15 VITALS — BP 138/68 | HR 73 | Temp 98.4°F | Ht 64.0 in | Wt 133.8 lb

## 2018-01-15 DIAGNOSIS — C4490 Unspecified malignant neoplasm of skin, unspecified: Secondary | ICD-10-CM | POA: Diagnosis not present

## 2018-01-15 DIAGNOSIS — C44310 Basal cell carcinoma of skin of unspecified parts of face: Secondary | ICD-10-CM

## 2018-01-15 DIAGNOSIS — R319 Hematuria, unspecified: Secondary | ICD-10-CM | POA: Diagnosis not present

## 2018-01-15 NOTE — Progress Notes (Signed)
Pre visit review using our clinic review tool, if applicable. No additional management support is needed unless otherwise documented below in the visit note. 

## 2018-01-15 NOTE — Patient Instructions (Addendum)
F/u in 4 months sooner if needed  I referred you to Dr. Hollice Espy in [place] [phone] If you would like I can refer you to [facility] as well in the future   Hematuria, Adult Hematuria is blood in your urine. It can be caused by a bladder infection, kidney infection, prostate infection, kidney stone, or cancer of your urinary tract. Infections can usually be treated with medicine, and a kidney stone usually will pass through your urine. If neither of these is the cause of your hematuria, further workup to find out the reason may be needed. It is very important that you tell your health care provider about any blood you see in your urine, even if the blood stops without treatment or happens without causing pain. Blood in your urine that happens and then stops and then happens again can be a symptom of a very serious condition. Also, pain is not a symptom in the initial stages of many urinary cancers. Follow these instructions at home:  Drink lots of fluid, 3-4 quarts a day. If you have been diagnosed with an infection, cranberry juice is especially recommended, in addition to large amounts of water.  Avoid caffeine, tea, and carbonated beverages because they tend to irritate the bladder.  Avoid alcohol because it may irritate the prostate.  Take all medicines as directed by your health care provider.  If you were prescribed an antibiotic medicine, finish it all even if you start to feel better.  If you have been diagnosed with a kidney stone, follow your health care provider's instructions regarding straining your urine to catch the stone.  Empty your bladder often. Avoid holding urine for long periods of time.  After a bowel movement, women should cleanse front to back. Use each tissue only once.  Empty your bladder before and after sexual intercourse if you are a female. Contact a health care provider if:  You develop back pain.  You have a fever.  You have a  feeling of sickness in your stomach (nausea) or vomiting.  Your symptoms are not better in 3 days. Return sooner if you are getting worse. Get help right away if:  You develop severe vomiting and are unable to keep the medicine down.  You develop severe back or abdominal pain despite taking your medicines.  You begin passing a large amount of blood or clots in your urine.  You feel extremely weak or faint, or you pass out. This information is not intended to replace advice given to you by your health care provider. Make sure you discuss any questions you have with your health care provider. Document Released: 10/10/2005 Document Revised: 03/17/2016 Document Reviewed: 06/10/2013 Elsevier Interactive Patient Education  2017 [employer].

## 2018-01-16 ENCOUNTER — Encounter: Payer: Self-pay | Admitting: Internal Medicine

## 2018-01-16 NOTE — Progress Notes (Addendum)
Chief Complaint  Patient presents with  . Follow-up   F/u with husband  1. Hematuria CT renal negative but still with trace hematuria x 2 she had OB/GYN visit recently for vaginal atrophy with GYN note no cause for hematuria will refer to urology. [facility], [facility] or [place] since he works for them but willing to go locally. She did report wiping after urination and noticing blood smear on toilet paper  2. Saw dermatology with +BCC forehead and will have Mohs at [facility] clinic Dr. Anne Fu upcoming.  3. Elevated BP w/o h/o146/62 repeat 138/68   Review of Systems  Constitutional: Negative for weight loss.  HENT: Negative for hearing loss.   Eyes: Negative for blurred vision.  Respiratory: Negative for shortness of breath.   Cardiovascular: Negative for chest pain.  Genitourinary:       Blood on toilet paper with wiping w/o gross blood in urine    Past Medical History:  Diagnosis Date  . Allergic rhinitis due to pollen   . Endometriosis   . Hyperlipidemia   . Osteopenia 11/12   T -1.8 in hip  . Sleep disturbance   . Vaginal atrophy    Past Surgical History:  Procedure Laterality Date  . ABDOMINAL HYSTERECTOMY     total   . APPENDECTOMY    . CATARACT EXTRACTION W/ INTRAOCULAR LENS  IMPLANT, BILATERAL    . COLONOSCOPY WITH PROPOFOL N/A 01/22/2016   Procedure: COLONOSCOPY WITH PROPOFOL;  Surgeon: Josefine Class, MD;  Location: [facility] ENDOSCOPY;  Service: Endoscopy;  Laterality: N/A;  . ROTATOR CUFF REPAIR Right ~2001   Family History  Problem Relation Age of Onset  . Cancer Mother        CLL  . Cancer Father        colon  . Aneurysm Father   . Cancer Brother        colon cancer  . Heart disease Maternal Uncle   . Diabetes Neg Hx   . Breast cancer Neg Hx    Social History   Socioeconomic History  . Marital status: Married    Spouse name: Not on file  . Number of children: 0  . Years of education: Not on file  . Highest  education level: Not on file  Occupational History  . 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 Financial resource strain: Not on file  . Food insecurity:    Worry: Not on file    Inability: Not on file  . Transportation needs:    Medical: Not on file    Non-medical: Not on file  Tobacco Use  . Smoking status: Former Research scientist (life sciences)  . Smokeless tobacco: Never Used  . Tobacco comment: as of 08/2017 quit smoking >15 years ago 1ppd x 35 years no FH lung cancer   Substance and Sexual Activity  . Alcohol use: Yes    Alcohol/week: 1.8 oz    Types: 3 Shots of liquor per week    Comment: drinks gin  . Drug use: No  . Sexual activity: Never  Lifestyle  . Physical activity:    Days per week: Not on file    Minutes per session: Not on file  . Stress: Not on file  Relationships  . Social connections:    Talks on phone: Not on file    Gets together: Not on file    Attends religious service: Not on file    Active member of  club or organization: Not on file    Attends meetings of clubs or organizations: Not on file    Relationship status: Not on file  . Intimate partner violence:    Fear of current or ex partner: Not on file    Emotionally abused: Not on file    Physically abused: Not on file    Forced sexual activity: Not on file  Other Topics Concern  . Not on file  Social History Narrative   Has living will   Husband is health care POA.   Would accept resuscitation but no prolonged ventilation   Not sure about tube feeds   Moved her from [place]    Exercises 3x per week    Current Meds  Medication Sig  . BOOSTRIX 5-2.5-18.5 LF-MCG/0.5 injection ADM 0.5ML IM UTD  . Calcium Carbonate (CALCIUM 600 PO) Take 1 tablet by mouth daily.  . Cholecalciferol (VITAMIN D3 PO) Take 2 capsules by mouth daily.  . diphenhydrAMINE (BENADRYL) 25 MG tablet Take 25 mg by mouth every 6 (six) hours as needed.  . eszopiclone (LUNESTA) 2 MG TABS tablet Take 1  tablet (2 mg total) by mouth at bedtime as needed.  . fluticasone (FLONASE) 50 MCG/ACT nasal spray 1-2 sprays each nose once daily. STOP AFRIN  . simvastatin (ZOCOR) 10 MG tablet Take 10 mg by mouth once a week.   Allergies  Allergen Reactions  . Contrast Media [Iodinated Diagnostic Agents] Anaphylaxis   Recent Results (from the past 2160 hour(s))  Urinalysis, Routine w reflex microscopic     Status: Abnormal   Collection Time: 12/21/17 10:04 AM  Result Value Ref Range   Color, Urine YELLOW Yellow;Lt. Yellow   APPearance CLEAR Clear   Specific Gravity, Urine <=1.005 (A) 1.000 - 1.030   pH 7.0 5.0 - 8.0   Total Protein, Urine NEGATIVE Negative   Urine Glucose NEGATIVE Negative   Ketones, ur NEGATIVE Negative   Bilirubin Urine NEGATIVE Negative   Hgb urine dipstick TRACE-INTACT (A) Negative   Urobilinogen, UA 0.2 0.0 - 1.0   Leukocytes, UA NEGATIVE Negative   Nitrite NEGATIVE Negative   WBC, UA 0-2/hpf 0-2/hpf   RBC / HPF 0-2/hpf 0-2/hpf   Squamous Epithelial / LPF Rare(0-4/hpf) Rare(0-4/hpf)  Urine Culture     Status: None   Collection Time: 12/21/17 10:04 AM  Result Value Ref Range   MICRO NUMBER: [id number]    SPECIMEN QUALITY: ADEQUATE    Sample Source NOT GIVEN    STATUS: FINAL    Result: No Growth    Objective  Body mass index is 22.97 kg/m. Wt Readings from Last 3 Encounters:  01/15/18 133 lb 12.8 oz (60.7 kg)  01/09/18 135 lb (61.2 kg)  12/21/17 133 lb 9 oz (60.6 kg)   Temp Readings from Last 3 Encounters:  01/15/18 98.4 F (36.9 C) (Oral)  12/21/17 98.4 F (36.9 C) (Oral)  11/09/17 98.4 F (36.9 C) (Oral)   BP Readings from Last 3 Encounters:  01/15/18 138/68  01/09/18 128/80  12/21/17 128/60   Pulse Readings from Last 3 Encounters:  01/15/18 73  01/09/18 75  12/21/17 85   O2 sat room air 94%  Physical Exam  Constitutional: She is oriented to person, place, and time and well-developed, well-nourished, and in no distress. Vital signs are normal.   HENT:  Head: Normocephalic and atraumatic.  Mouth/Throat: Oropharynx is clear and moist and mucous membranes are normal.  Eyes: Pupils are equal, round, and reactive to light. Conjunctivae are normal.  Cardiovascular: Normal rate, regular rhythm and normal heart sounds.  Pulmonary/Chest: Effort normal and breath sounds normal.  Neurological: She is alert and oriented to person, place, and time. Gait normal.  Skin: Skin is warm, dry and intact.  Psychiatric: Mood, affect and judgment normal. She exhibits abnormal recent memory.  Nursing note and vitals reviewed.   Assessment   1. Hematuria  2. bcc mid forehead  3. HM Plan  1. Refer to Dr. Hollice Espy urology for consult and consider further w/u OB/GYN note reports no GYN etiology  See HPI  2. Pending mohs surgery  Had mohs forehead on 01/19/18  3.  Had flu and pna vaccines x 2  Had zostervax  Tdap had 12/25/17 and shingrix has not had yet  Out of pap window s/p total hysterectomy just saw OB/GYN 12/2017 mammo neg 11/07/17  Consider dexa in future 08/31/11 +osteopenia need to check vitamin D in future  Colonoscopy: 01/22/16 Dr. Rayann Heman h/o colon polpys. colonoscopy was difficult per report and she didn't like the MD who did the procedure. Reviewed +IH and adhesions making exam difficult.  cologuard done 02/03/16 repeat due 02/03/2019 until age 26 reviewed with pt ok with this plan  Saw dermatology on [place] 06/16/17 had [place] LN2 f/u in 1 year, recently f/u 12/2017 pending mohs    She is former smoker quit >15 years ago smoked 1 ppd x 35 yeas no FH lung cancer  -consider CT low dose if insurance will cover in future   Cont exercise 3x per week   Provider: Dr. Olivia Mackie McLean-Scocuzza-Internal Medicine

## 2018-01-19 DIAGNOSIS — L578 Other skin changes due to chronic exposure to nonionizing radiation: Secondary | ICD-10-CM | POA: Diagnosis not present

## 2018-01-19 DIAGNOSIS — L988 Other specified disorders of the skin and subcutaneous tissue: Secondary | ICD-10-CM | POA: Diagnosis not present

## 2018-01-19 DIAGNOSIS — C44319 Basal cell carcinoma of skin of other parts of face: Secondary | ICD-10-CM | POA: Diagnosis not present

## 2018-01-19 DIAGNOSIS — L814 Other melanin hyperpigmentation: Secondary | ICD-10-CM | POA: Diagnosis not present

## 2018-01-22 NOTE — Progress Notes (Signed)
01/23/2018 4:08 PM   [employer] 1936-04-05 [id number]  Referring provider: McLean-Scocuzza, Nino Glow, MD [place], [place] [address]  Chief Complaint  Patient presents with  . Hematuria    HPI: Patient is a 82 year old Caucasian female who presents today as a referral from Dr. Terese Door for gross hematuria.    She states that she has seen blood on her toilet paper when wiping.  She states she had no symptoms associated with seeing the blood.  She does not have a prior history of hematuria.  She  does not have a prior history of recurrent urinary tract infections, nephrolithiasis, trauma to the genitourinary tract or malignancies of the genitourinary tract.   She does not have a family medical history of nephrolithiasis, malignancies of the genitourinary tract or hematuria.   Today, she is having urgency (for the last couple of years), nocturia (going on forever) and incontinence (urge type).  Her today demonstrates 0-5 WBC's and 0-2 RBC's.  Patient denies any gross hematuria, dysuria or suprapubic/flank pain.  Patient denies any fevers, chills, nausea or vomiting.   She underwent CT Renal stone study on 01/02/2018 which noted no cause of hematuria identified on current unenhanced CT. 1.6 cm gallstone.  Post hysterectomy and appendectomy.   Degenerative changes lower thoracic and lumbar spine most notable L4-5 level.  Aortic Atherosclerosis   She is a former smoker, with a 1 ppd history for 20 years.   Quit 60 years ago.  She is not exposed to secondhand smoke.  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  She has HTN.    She admits that she does not drink enough water daily.  She drinks one glass of tea, occasional coffee, no juices and diet Pepsi.  She drinks 2 to 3 alcohol beverages daily.    PMH: Past Medical History:  Diagnosis Date  . Allergic rhinitis due to pollen   . Endometriosis   . Hyperlipidemia   . Osteopenia 11/12   T -1.8 in hip  . Sleep disturbance   . Vaginal  atrophy     Surgical History: Past Surgical History:  Procedure Laterality Date  . ABDOMINAL HYSTERECTOMY     total   . APPENDECTOMY    . CATARACT EXTRACTION W/ INTRAOCULAR LENS  IMPLANT, BILATERAL    . COLONOSCOPY WITH PROPOFOL N/A 01/22/2016   Procedure: COLONOSCOPY WITH PROPOFOL;  Surgeon: Josefine Class, MD;  Location: [facility] ENDOSCOPY;  Service: Endoscopy;  Laterality: N/A;  . ROTATOR CUFF REPAIR Right ~2001    Home Medications:  Allergies as of 01/23/2018      Reactions   Contrast Media [iodinated Diagnostic Agents] Anaphylaxis      Medication List        Accurate as of 01/23/18  4:08 PM. Always use your most recent med list.          BOOSTRIX 5-2.5-18.5 LF-MCG/0.5 injection Generic drug:  Tdap ADM 0.5ML IM UTD   CALCIUM 600 PO Take 1 tablet by mouth daily.   diphenhydrAMINE 25 MG tablet Commonly known as:  BENADRYL Take 25 mg by mouth every 6 (six) hours as needed.   eszopiclone 2 MG Tabs tablet Commonly known as:  LUNESTA Take 1 tablet (2 mg total) by mouth at bedtime as needed.   fluticasone 50 MCG/ACT nasal spray Commonly known as:  FLONASE 1-2 sprays each nose once daily. STOP AFRIN   simvastatin 10 MG tablet Commonly known as:  ZOCOR Take 10 mg by mouth once a week.  VITAMIN D3 PO Take 2 capsules by mouth daily.       Allergies:  Allergies  Allergen Reactions  . Contrast Media [Iodinated Diagnostic Agents] Anaphylaxis    Family History: Family History  Problem Relation Age of Onset  . Cancer Mother        CLL  . Cancer Father        colon  . Aneurysm Father   . Cancer Brother        colon cancer  . Heart disease Maternal Uncle   . Diabetes Neg Hx   . Breast cancer Neg Hx   . Bladder Cancer Neg Hx   . Kidney cancer Neg Hx     Social History:  reports that she has quit smoking. She has never used smokeless tobacco. She reports that she drinks about 1.8 oz of alcohol per week. She reports that she does not use  drugs.  ROS: UROLOGY Frequent Urination?: No Hard to postpone urination?: Yes Burning/pain with urination?: No Get up at night to urinate?: Yes Leakage of urine?: Yes Urine stream starts and stops?: No Trouble starting stream?: No Do you have to strain to urinate?: No Blood in urine?: Yes Urinary tract infection?: No Sexually transmitted disease?: No Injury to kidneys or bladder?: No Painful intercourse?: No Weak stream?: No Currently pregnant?: No Vaginal bleeding?: No Last menstrual period?: n  Gastrointestinal Nausea?: No Vomiting?: No Indigestion/heartburn?: No Diarrhea?: No Constipation?: No  Constitutional Fever: No Night sweats?: No Weight loss?: No Fatigue?: No  Skin Skin rash/lesions?: No Itching?: No  Eyes Blurred vision?: No Double vision?: No  Ears/Nose/Throat Sore throat?: No Sinus problems?: No  Hematologic/Lymphatic Swollen glands?: No Easy bruising?: No  Cardiovascular Leg swelling?: No Chest pain?: No  Respiratory Cough?: No Shortness of breath?: No  Endocrine Excessive thirst?: No  Musculoskeletal Back pain?: No Joint pain?: No  Neurological Headaches?: No Dizziness?: No  Psychologic Depression?: No Anxiety?: No  Physical Exam: BP (!) 175/79 (BP Location: Right Arm, Patient Position: Sitting, Cuff Size: Normal)   Pulse 87   Ht 5\' 4"  (1.626 m)   Wt 129 lb 1.6 oz (58.6 kg)   BMI 22.16 kg/m   Constitutional: Well nourished. Alert and oriented, No acute distress. HEENT: [place] AT, moist mucus membranes. Trachea midline, no masses. Cardiovascular: No clubbing, cyanosis, or edema. Respiratory: Normal respiratory effort, no increased work of breathing. GI: Abdomen is soft, non tender, non distended, no abdominal masses. Liver and spleen not palpable.  No hernias appreciated.  Stool sample for occult testing is not indicated.   GU: No CVA tenderness.  No bladder fullness or masses.   Skin: No rashes, bruises or suspicious  lesions. Lymph: No cervical or inguinal adenopathy. Neurologic: Grossly intact, no focal deficits, moving all 4 extremities. Psychiatric: Normal mood and affect.  Laboratory Data: Lab Results  Component Value Date   WBC 8.1 07/10/2017   HGB 14.4 07/10/2017   HCT 42.8 07/10/2017   MCV 92.8 07/10/2017   PLT 192.0 07/10/2017    Lab Results  Component Value Date   CREATININE 0.78 07/10/2017    No results found for: PSA  No results found for: TESTOSTERONE  No results found for: HGBA1C  Lab Results  Component Value Date   TSH 1.63 07/10/2017       Component Value Date/Time   CHOL 191 07/10/2017 1433   HDL 68.50 07/10/2017 1433   CHOLHDL 3 07/10/2017 1433   VLDL 15.4 07/10/2017 1433   LDLCALC 107 (H) 07/10/2017 1433  Lab Results  Component Value Date   AST 19 07/10/2017   Lab Results  Component Value Date   ALT 9 07/10/2017   No components found for: ALKALINEPHOPHATASE No components found for: BILIRUBINTOTAL  No results found for: ESTRADIOL   Urinalysis See Epic.  I have reviewed the labs.  Pertinent Imaging: CLINICAL DATA:  82 year old female with microscopic hematuria for couple months. Patient recently saw blood in urine. Pain on urination. Prior appendectomy and hysterectomy. Initial encounter.  EXAM: CT ABDOMEN AND PELVIS WITHOUT CONTRAST  TECHNIQUE: Multidetector CT imaging of the abdomen and pelvis was performed following the standard protocol without IV contrast.  COMPARISON:  None.  FINDINGS: Lower chest: No worrisome lung base abnormality. Heart size within normal limits.  Hepatobiliary: Taking into account limitation by non contrast imaging, no worrisome hepatic lesion.  1.6 cm gallstone.  Pancreas: Taking into account limitation by non contrast imaging, no worrisome hepatic lesion or inflammation.  Spleen: Taking into account limitation by non contrast imaging, no worrisome splenic lesion or  enlargement.  Adrenals/Urinary Tract: No obstructing renal or ureteral calculi or hydronephrosis. Taking into account limitation by non contrast imaging, no worrisome renal or adrenal lesion.  Contracted noncontrast filled urinary bladder without gross abnormality.  Stomach/Bowel: No extraluminal bowel inflammatory process. Under distended stomach without gross abnormality.  Vascular/Lymphatic: Atherosclerotic changes aorta without aneurysm. Atherosclerotic changes aortic branch vessels, iliac arteries and femoral arteries.  No adenopathy.  Reproductive: Post hysterectomy.  No adnexal mass noted.  Other: No free air or bowel containing hernia.  Musculoskeletal: Degenerative changes lower thoracic and lumbar spine most notable L4-5 level. No osseous destructive lesion.  IMPRESSION: No cause of hematuria identified on current unenhanced CT.  1.6 cm gallstone.  Post hysterectomy and appendectomy.  Degenerative changes lower thoracic and lumbar spine most notable L4-5 level.  Aortic Atherosclerosis (ICD10-I70.0).   Electronically Signed   By: Genia Del M.D.   On: 01/02/2018 11:52 I have reviewed the labs.  Assessment & Plan:    1. Gross hematuria I explained to the patient that there are a number of causes that can be associated with blood in the urine, such as stones, UTI's, damage to the urinary tract and/or cancer. Explained to the patient that the AUA guidelines state that a CT urogram is the preferred imaging study to evaluate hematuria but due to her allergy to contrast and advanced age, a RUS would be reasonable at this time.  If any ominous findings are discovered on the RUS will consider contrast imaging at that time.  I did explain that there is a possibility of missing some urological tumors.  She is agreeable to have the RUS and understands the risks.  Her reproductive status is hysterectomy Following the imaging study,  I've recommended a  cystoscopy. I described how this is performed, typically in an office setting with a flexible cystoscope. We described the risks, benefits, and possible side effects, the most common of which is a minor amount of blood in the urine and/or burning which usually resolves in 24 to 48 hours.    - The patient had the opportunity to ask questions which were answered. Based upon this discussion, the patient is willing to proceed. Therefore, I've ordered: a CT Urogram and cystoscopy.  - The patient will return following all of the above for discussion of the results.   - UA  - Urine culture  - BUN + creatinine    Return for RUS and cystoscopy .  These notes generated  with voice recognition software. I apologize for typographical errors.  Zara Council, [facility] [address], [place], [place] [address] [phone]

## 2018-01-23 ENCOUNTER — Encounter: Payer: Self-pay | Admitting: Urology

## 2018-01-23 ENCOUNTER — Ambulatory Visit (INDEPENDENT_AMBULATORY_CARE_PROVIDER_SITE_OTHER): Payer: Medicare Other | Admitting: Urology

## 2018-01-23 VITALS — BP 175/79 | HR 87 | Ht 64.0 in | Wt 129.1 lb

## 2018-01-23 DIAGNOSIS — R310 Gross hematuria: Secondary | ICD-10-CM

## 2018-01-23 NOTE — Patient Instructions (Signed)
Hematuria, Adult Hematuria is blood in your urine. It can be caused by a bladder infection, kidney infection, prostate infection, kidney stone, or cancer of your urinary tract. Infections can usually be treated with medicine, and a kidney stone usually will pass through your urine. If neither of these is the cause of your hematuria, further workup to find out the reason may be needed. It is very important that you tell your health care provider about any blood you see in your urine, even if the blood stops without treatment or happens without causing pain. Blood in your urine that happens and then stops and then happens again can be a symptom of a very serious condition. Also, pain is not a symptom in the initial stages of many urinary cancers. Follow these instructions at home:  Drink lots of fluid, 3-4 quarts a day. If you have been diagnosed with an infection, cranberry juice is especially recommended, in addition to large amounts of water.  Avoid caffeine, tea, and carbonated beverages because they tend to irritate the bladder.  Avoid alcohol because it may irritate the prostate.  Take all medicines as directed by your health care provider.  If you were prescribed an antibiotic medicine, finish it all even if you start to feel better.  If you have been diagnosed with a kidney stone, follow your health care provider's instructions regarding straining your urine to catch the stone.  Empty your bladder often. Avoid holding urine for long periods of time.  After a bowel movement, women should cleanse front to back. Use each tissue only once.  Empty your bladder before and after sexual intercourse if you are a female. Contact a health care provider if:  You develop back pain.  You have a fever.  You have a feeling of sickness in your stomach (nausea) or vomiting.  Your symptoms are not better in 3 days. Return sooner if you are getting worse. Get help right away if:  You develop  severe vomiting and are unable to keep the medicine down.  You develop severe back or abdominal pain despite taking your medicines.  You begin passing a large amount of blood or clots in your urine.  You feel extremely weak or faint, or you pass out. This information is not intended to replace advice given to you by your health care provider. Make sure you discuss any questions you have with your health care provider. Document Released: 10/10/2005 Document Revised: 03/17/2016 Document Reviewed: 06/10/2013 Elsevier Interactive Patient Education  2017 Elsevier Inc.  Cystoscopy Cystoscopy is a procedure that is used to help diagnose and sometimes treat conditions that affect that lower urinary tract. The lower urinary tract includes the bladder and the tube that drains urine from the bladder out of the body (urethra). Cystoscopy is performed with a thin, tube-shaped instrument with a light and camera at the end (cystoscope). The cystoscope may be hard (rigid) or flexible, depending on the goal of the procedure.The cystoscope is inserted through the urethra, into the bladder. Cystoscopy may be recommended if you have:  Urinary tractinfections that keep coming back (recurring).  Blood in the urine (hematuria).  Loss of bladder control (urinary incontinence) or an overactive bladder.  Unusual cells found in a urine sample.  A blockage in the urethra.  Painful urination.  An abnormality in the bladder found during an intravenous pyelogram (IVP) or CT scan.  Cystoscopy may also be done to remove a sample of tissue to be examined under a microscope (biopsy). Tell   a health care provider about:  Any allergies you have.  All medicines you are taking, including vitamins, herbs, eye drops, creams, and over-the-counter medicines.  Any problems you or family members have had with anesthetic medicines.  Any blood disorders you have.  Any surgeries you have had.  Any medical conditions you  have.  Whether you are pregnant or may be pregnant. What are the risks? Generally, this is a safe procedure. However, problems may occur, including:  Infection.  Bleeding.  Allergic reactions to medicines.  Damage to other structures or organs.  What happens before the procedure?  Ask your health care provider about: ? Changing or stopping your regular medicines. This is especially important if you are taking diabetes medicines or blood thinners. ? Taking medicines such as aspirin and ibuprofen. These medicines can thin your blood. Do not take these medicines before your procedure if your health care provider instructs you not to.  Follow instructions from your health care provider about eating or drinking restrictions.  You may be given antibiotic medicine to help prevent infection.  You may have an exam or testing, such as X-rays of the bladder, urethra, or kidneys.  You may have urine tests to check for signs of infection.  Plan to have someone take you home after the procedure. What happens during the procedure?  To reduce your risk of infection,your health care team will wash or sanitize their hands.  You will be given one or more of the following: ? A medicine to help you relax (sedative). ? A medicine to numb the area (local anesthetic).  The area around the opening of your urethra will be cleaned.  The cystoscope will be passed through your urethra into your bladder.  Germ-free (sterile)fluid will flow through the cystoscope to fill your bladder. The fluid will stretch your bladder so that your surgeon can clearly examine your bladder walls.  The cystoscope will be removed and your bladder will be emptied. The procedure may vary among health care providers and hospitals. What happens after the procedure?  You may have some soreness or pain in your abdomen and urethra. Medicines will be available to help you.  You may have some blood in your urine.  Do not  drive for 24 hours if you received a sedative. This information is not intended to replace advice given to you by your health care provider. Make sure you discuss any questions you have with your health care provider. Document Released: 10/07/2000 Document Revised: 02/18/2016 Document Reviewed: 08/27/2015 Elsevier Interactive Patient Education  2018 Elsevier Inc.  

## 2018-01-24 LAB — URINALYSIS, COMPLETE
BILIRUBIN UA: NEGATIVE
Glucose, UA: NEGATIVE
Ketones, UA: NEGATIVE
Nitrite, UA: NEGATIVE
PH UA: 5.5 (ref 5.0–7.5)
Protein, UA: NEGATIVE
Specific Gravity, UA: 1.02 (ref 1.005–1.030)
Urobilinogen, Ur: 0.2 mg/dL (ref 0.2–1.0)

## 2018-01-24 LAB — BUN+CREAT
BUN / CREAT RATIO: 19 (ref 12–28)
BUN: 14 mg/dL (ref 8–27)
CREATININE: 0.72 mg/dL (ref 0.57–1.00)
GFR calc Af Amer: 92 mL/min/{1.73_m2} (ref >59)
GFR, EST NON AFRICAN AMERICAN: 80 mL/min/{1.73_m2} (ref >59)

## 2018-01-24 LAB — MICROSCOPIC EXAMINATION: Bacteria, UA: NONE SEEN

## 2018-01-26 ENCOUNTER — Telehealth: Payer: Self-pay

## 2018-01-26 LAB — CULTURE, URINE COMPREHENSIVE

## 2018-01-26 MED ORDER — AMOXICILLIN-POT CLAVULANATE 875-125 MG PO TABS: 1.0000 | ORAL_TABLET | Freq: Two times a day (BID) | ORAL | 0 refills | Status: AC

## 2018-01-26 NOTE — Telephone Encounter (Signed)
-----   Message from Nori Riis, PA-C sent at 01/26/2018  7:58 AM EDT ----- Please let Annette Lee know that her urine was positive for infection.  She needs to start Augmentin 875/125, bid for seven days.  We then need to recheck her urine once she has completed her antibiotic to make sure the infection has cleared.

## 2018-01-26 NOTE — Telephone Encounter (Signed)
Spoke with pt in reference to +ucx and abx. Made aware [facility]x were [facility] to [facility]. Made aware will need to check urine again after completing abx. Pt voiced understanding. Nurse visit made.

## 2018-01-28 ENCOUNTER — Encounter: Payer: Self-pay | Admitting: Urology

## 2018-01-30 ENCOUNTER — Ambulatory Visit
Admission: RE | Admit: 2018-01-30 | Discharge: 2018-01-30 | Disposition: A | Discharge status: Home / Self Care | Payer: Medicare Other | Source: Ambulatory Visit | Attending: Urology | Admitting: Urology

## 2018-01-30 DIAGNOSIS — R310 Gross hematuria: Secondary | ICD-10-CM | POA: Diagnosis not present

## 2018-02-05 ENCOUNTER — Ambulatory Visit: Payer: Medicare Other

## 2018-02-05 VITALS — BP 134/80 | HR 73 | Ht 64.0 in | Wt 130.0 lb

## 2018-02-05 DIAGNOSIS — R310 Gross hematuria: Secondary | ICD-10-CM | POA: Diagnosis not present

## 2018-02-05 LAB — URINALYSIS, COMPLETE
BILIRUBIN UA: NEGATIVE
GLUCOSE, UA: NEGATIVE
KETONES UA: NEGATIVE
Leukocytes, UA: NEGATIVE
Nitrite, UA: NEGATIVE
PROTEIN UA: NEGATIVE
Urobilinogen, Ur: 0.2 mg/dL (ref 0.2–1.0)
pH, UA: 6 (ref 5.0–7.5)

## 2018-02-05 NOTE — Progress Notes (Signed)
Pt provided a clean catch urine to be sent for u/a and cx.  Blood pressure 134/80, pulse 73, height 5\' 4"  (1.626 m), weight 130 lb (59 kg).

## 2018-02-08 ENCOUNTER — Other Ambulatory Visit: Payer: Self-pay | Admitting: Urology

## 2018-02-08 ENCOUNTER — Encounter: Payer: Self-pay | Admitting: Urology

## 2018-02-08 ENCOUNTER — Telehealth: Payer: Self-pay

## 2018-02-08 LAB — CULTURE, URINE COMPREHENSIVE

## 2018-02-08 MED ORDER — TETRACYCLINE HCL 500 MG PO CAPS: 500.0000 mg | ORAL_CAPSULE | Freq: Two times a day (BID) | ORAL | 0 refills | Status: DC

## 2018-02-08 NOTE — Telephone Encounter (Signed)
-----   Message from Nori Riis, PA-C sent at 02/08/2018  7:54 AM EDT ----- Please let Mrs. Raulerson know that her urine culture is positive.  She has a different bacteria from the previous.  She needs to start tetracycline 500 mg, bid x seven days.  I have sent the script to [facility].  She will need to have a CATH UA when she completes the antibiotic for culture.

## 2018-02-08 NOTE — Progress Notes (Signed)
Tetracycline sent to [facility].

## 2018-02-08 NOTE — Telephone Encounter (Signed)
Spoke with pt in reference to ucx results and teteracycline. Pt voiced understanding. Nurse visit was made for cath specimen post abx.

## 2018-02-19 ENCOUNTER — Ambulatory Visit (INDEPENDENT_AMBULATORY_CARE_PROVIDER_SITE_OTHER): Payer: Medicare Other

## 2018-02-19 VITALS — BP 146/68 | HR 76

## 2018-02-19 DIAGNOSIS — N390 Urinary tract infection, site not specified: Secondary | ICD-10-CM

## 2018-02-19 LAB — MICROSCOPIC EXAMINATION
RBC, UA: NONE SEEN /hpf (ref 0–2)
WBC, UA: NONE SEEN /hpf (ref 0–5)

## 2018-02-19 LAB — URINALYSIS, COMPLETE
Bilirubin, UA: NEGATIVE
Glucose, UA: NEGATIVE
KETONES UA: NEGATIVE
Leukocytes, UA: NEGATIVE
Nitrite, UA: NEGATIVE
PROTEIN UA: NEGATIVE
Urobilinogen, Ur: 0.2 mg/dL (ref 0.2–1.0)
pH, UA: 5 (ref 5.0–7.5)

## 2018-02-19 NOTE — Progress Notes (Signed)
In and Out Catheterization  Patient is present today for a I & O catheterization due to urine recheck after abx. Patient was cleaned and prepped in a sterile fashion with betadine and Lidocaine 2% jelly was instilled into the urethra.  A 14FR cath was inserted no complications were noted , 283ml of urine return was noted, urine was clear in color. A clean urine sample was collected for UA. Bladder was drained  And catheter was removed with out difficulty.    Preformed by: Fonnie Jarvis, CMA  Follow up/ Additional notes: This was for a recheck after infection patient was notified that today's urine was clear and to keep cysto apt for follow up

## 2018-02-21 ENCOUNTER — Encounter: Payer: Self-pay | Admitting: Urology

## 2018-02-21 ENCOUNTER — Ambulatory Visit (INDEPENDENT_AMBULATORY_CARE_PROVIDER_SITE_OTHER): Payer: Medicare Other | Admitting: Urology

## 2018-02-21 VITALS — BP 151/84 | HR 72 | Resp 16 | Ht 64.0 in | Wt 133.0 lb

## 2018-02-21 DIAGNOSIS — R310 Gross hematuria: Secondary | ICD-10-CM

## 2018-02-21 DIAGNOSIS — N390 Urinary tract infection, site not specified: Secondary | ICD-10-CM | POA: Diagnosis not present

## 2018-02-21 LAB — MICROSCOPIC EXAMINATION: Bacteria, UA: NONE SEEN

## 2018-02-21 LAB — URINALYSIS, COMPLETE
Bilirubin, UA: NEGATIVE
GLUCOSE, UA: NEGATIVE
KETONES UA: NEGATIVE
NITRITE UA: NEGATIVE
PROTEIN UA: NEGATIVE
SPEC GRAV UA: 1.02 (ref 1.005–1.030)
Urobilinogen, Ur: 0.2 mg/dL (ref 0.2–1.0)
pH, UA: 5.5 (ref 5.0–7.5)

## 2018-02-21 MED ORDER — CIPROFLOXACIN HCL 500 MG PO TABS
500.0000 mg | ORAL_TABLET | Freq: Once | ORAL | Status: AC
  Administered 2018-02-21: 500 mg via ORAL

## 2018-02-21 MED ORDER — LIDOCAINE HCL URETHRAL/MUCOSAL 2 % EX GEL
1.0000 | Freq: Once | CUTANEOUS | Status: AC
Start: 2018-02-21 — End: 2018-02-21
  Administered 2018-02-21: 1 via URETHRAL

## 2018-02-21 NOTE — Progress Notes (Signed)
   02/21/18  CC: No chief complaint on file.   HPI: Refer to [place] [place] McGowan's note dated 01/23/2018.  She had one episode of blood on toilet paper when wiping.  She denies any recurrence or gross hematuria per se.  Renal ultrasound was unremarkable.  Blood pressure (!) 151/84, pulse 72, resp. rate 16, height 5\' 4"  (1.626 m), weight 133 lb (60.3 kg), SpO2 98 %. NED. A&Ox3.   No respiratory distress   Abd soft, NT, ND Atrophic external genitalia with patent urethral meatus  Cystoscopy Procedure Note  Patient identification was confirmed, informed consent was obtained, and patient was prepped using Betadine solution.  Lidocaine jelly was administered per urethral meatus.    Preoperative abx where received prior to procedure.    Procedure: - Flexible cystoscope introduced, without any difficulty.   - Thorough search of the bladder revealed:    normal urethral meatus    normal urothelium    no stones    no ulcers     no tumors    no urethral polyps    no trabeculation  - Ureteral orifices were normal in position and appearance.  Post-Procedure: - Patient tolerated the procedure well  Assessment/ Plan: Unremarkable cystoscopy.  Would recommend at least an annual UA with her primary provider and referral for clinically significant microhematuria (greater than 3 RBCs per high-power field on microscopy)   Abbie Sons, MD

## 2018-03-02 ENCOUNTER — Encounter: Payer: Self-pay | Admitting: Internal Medicine

## 2018-03-02 ENCOUNTER — Ambulatory Visit (INDEPENDENT_AMBULATORY_CARE_PROVIDER_SITE_OTHER): Payer: Medicare Other | Admitting: Internal Medicine

## 2018-03-02 VITALS — BP 122/64 | HR 76 | Temp 98.0°F | Resp 16 | Ht 60.0 in | Wt 132.5 lb

## 2018-03-02 DIAGNOSIS — N3001 Acute cystitis with hematuria: Secondary | ICD-10-CM | POA: Insufficient documentation

## 2018-03-02 DIAGNOSIS — L989 Disorder of the skin and subcutaneous tissue, unspecified: Secondary | ICD-10-CM | POA: Diagnosis not present

## 2018-03-02 HISTORY — DX: Disorder of the skin and subcutaneous tissue, unspecified: L98.9

## 2018-03-02 NOTE — Progress Notes (Signed)
Chief Complaint  Patient presents with  . Follow-up   F/u  1. E coli UTI 02/05/18 w/o sx's tried Augmentin 1st and did not help then Rx Tetracycline which helped. Hematuria saw urology McGowan then had cystoscopy with Dr. Bernardo Heater 02/21/18 neg f/u UA in 1 year  2. New lesions left cheek papule blue x 4 weeks new and wants referral to [facility] near [place]  Constitutional: Negative for weight loss.  HENT: Negative for hearing loss.   Eyes: Negative for blurred vision.  Respiratory: Negative for shortness of breath.   Cardiovascular: Negative for chest pain.  Gastrointestinal: Negative for heartburn.  Genitourinary: Negative for dysuria.  Skin: Negative for rash.  Neurological: Negative for headaches.  Psychiatric/Behavioral: Negative for depression.   Past Medical History:  Diagnosis Date  . Allergic rhinitis due to pollen   . Endometriosis   . Hyperlipidemia   . Osteopenia 11/12   T -1.8 in hip  . Sleep disturbance   . Vaginal atrophy    Past Surgical History:  Procedure Laterality Date  . ABDOMINAL HYSTERECTOMY     total   . APPENDECTOMY    . CATARACT EXTRACTION W/ INTRAOCULAR LENS  IMPLANT, BILATERAL    . COLONOSCOPY WITH PROPOFOL N/A 01/22/2016   Procedure: COLONOSCOPY WITH PROPOFOL;  Surgeon: Josefine Class, MD;  Location: [facility] ENDOSCOPY;  Service: Endoscopy;  Laterality: N/A;  . ROTATOR CUFF REPAIR Right ~2001   Family History  Problem Relation Age of Onset  . Cancer Mother        CLL  . Cancer Father        colon  . Aneurysm Father   . Cancer Brother        colon cancer  . Heart disease Maternal Uncle   . Diabetes Neg Hx   . Breast cancer Neg Hx   . Bladder Cancer Neg Hx   . Kidney cancer Neg Hx    Social History   Socioeconomic History  . Marital status: Married    Spouse name: Not on file  . Number of children: 0  . Years of education: Not on file  . Highest education level: Not on file  Occupational History  .  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 Financial resource strain: Not on file  . Food insecurity:    Worry: Not on file    Inability: Not on file  . Transportation needs:    Medical: Not on file    Non-medical: Not on file  Tobacco Use  . Smoking status: Former Research scientist (life sciences)  . Smokeless tobacco: Never Used  . Tobacco comment: as of 08/2017 quit smoking >15 years ago 1ppd x 35 years no FH lung cancer   Substance and Sexual Activity  . Alcohol use: Yes    Alcohol/week: 1.8 oz    Types: 3 Shots of liquor per week    Comment: drinks gin  . Drug use: No  . Sexual activity: Never  Lifestyle  . Physical activity:    Days per week: Not on file    Minutes per session: Not on file  . Stress: Not on file  Relationships  . Social connections:    Talks on phone: Not on file    Gets together: Not on file    Attends religious service: Not on file    Active member of club or organization: Not on file    Attends meetings of clubs or  organizations: Not on file    Relationship status: Not on file  . Intimate partner violence:    Fear of current or ex partner: Not on file    Emotionally abused: Not on file    Physically abused: Not on file    Forced sexual activity: Not on file  Other Topics Concern  . Not on file  Social History Narrative   Has living will   Husband is health care POA.   Would accept resuscitation but no prolonged ventilation   Not sure about tube feeds   Moved her from [place]    Exercises 3x per week    Current Meds  Medication Sig  . simvastatin (ZOCOR) 10 MG tablet Take 10 mg by mouth once a week.   Allergies  Allergen Reactions  . Contrast Media [Iodinated Diagnostic Agents] Anaphylaxis   Recent Results (from the past 2160 hour(s))  Urinalysis, Routine w reflex microscopic     Status: Abnormal   Collection Time: 12/21/17 10:04 AM  Result Value Ref Range   Color, Urine YELLOW Yellow;Lt. Yellow   APPearance CLEAR  Clear   Specific Gravity, Urine <=1.005 (A) 1.000 - 1.030   pH 7.0 5.0 - 8.0   Total Protein, Urine NEGATIVE Negative   Urine Glucose NEGATIVE Negative   Ketones, ur NEGATIVE Negative   Bilirubin Urine NEGATIVE Negative   Hgb urine dipstick TRACE-INTACT (A) Negative   Urobilinogen, UA 0.2 0.0 - 1.0   Leukocytes, UA NEGATIVE Negative   Nitrite NEGATIVE Negative   WBC, UA 0-2/hpf 0-2/hpf   RBC / HPF 0-2/hpf 0-2/hpf   Squamous Epithelial / LPF Rare(0-4/hpf) Rare(0-4/hpf)  Urine Culture     Status: None   Collection Time: 12/21/17 10:04 AM  Result Value Ref Range   MICRO NUMBER: [id number]    SPECIMEN QUALITY: ADEQUATE    Sample Source NOT GIVEN    STATUS: FINAL    Result: No Growth   Urinalysis, Complete     Status: Abnormal   Collection Time: 01/23/18  3:04 PM  Result Value Ref Range   Specific Gravity, UA 1.020 1.005 - 1.030   pH, UA 5.5 5.0 - 7.5   Color, UA Yellow Yellow   Appearance Ur Clear Clear   Leukocytes, UA Trace (A) Negative   Protein, UA Negative Negative/Trace   Glucose, UA Negative Negative   Ketones, UA Negative Negative   RBC, UA 2+ (A) Negative   Bilirubin, UA Negative Negative   Urobilinogen, Ur 0.2 0.2 - 1.0 mg/dL   Nitrite, UA Negative Negative   Microscopic Examination See below:   Microscopic Examination     Status: Abnormal   Collection Time: 01/23/18  3:04 PM  Result Value Ref Range   WBC, UA 0-5 0 - 5 /hpf   RBC, UA 0-2 0 - 2 /hpf   Epithelial Cells (non renal) 0-10 0 - 10 /hpf   Mucus, UA Present (A) Not Estab.   Bacteria, UA None seen None seen/Few  CULTURE, URINE COMPREHENSIVE     Status: Abnormal   Collection Time: 01/23/18  3:30 PM  Result Value Ref Range   Urine Culture, Comprehensive Final report (A)    Organism ID, Bacteria Escherichia coli (A)     Comment: 25,000-50,000 colony forming units per mL Cefazolin <=4 ug/mL Cefazolin with an MIC <=16 predicts susceptibility to the oral agents cefaclor, cefdinir, cefpodoxime, cefprozil,  cefuroxime, cephalexin, and loracarbef when used for therapy of uncomplicated urinary tract infections due to E. coli, Klebsiella pneumoniae, and  Proteus mirabilis.    Organism ID, Bacteria Comment     Comment: Mixed urogenital flora 10,000-25,000 colony forming units per mL    ANTIMICROBIAL SUSCEPTIBILITY Comment     Comment:       ** S = Susceptible; I = Intermediate; R = Resistant **                    P = Positive; N = Negative             MICS are expressed in micrograms per mL    Antibiotic                 RSLT#1    RSLT#2    RSLT#3    RSLT#4 Amoxicillin/Clavulanic Acid    S Ampicillin                     S Cefepime                       S Ceftriaxone                    S Cefuroxime                     S Ciprofloxacin                  S Ertapenem                      S Gentamicin                     S Imipenem                       S Levofloxacin                   S Meropenem                      S Nitrofurantoin                 S Piperacillin/Tazobactam        S Tetracycline                   S Tobramycin                     S Trimethoprim/Sulfa             S   BUN+Creat     Status: None   Collection Time: 01/23/18  3:31 PM  Result Value Ref Range   BUN 14 8 - 27 mg/dL   Creatinine, Ser 0.72 0.57 - 1.00 mg/dL   GFR calc non Af Amer 80 >59 mL/min/1.73   GFR calc Af Amer 92 >59 mL/min/1.73   BUN/Creatinine Ratio 19 12 - 28  Urinalysis, Complete     Status: Abnormal   Collection Time: 02/05/18 10:49 AM  Result Value Ref Range   Specific Gravity, UA <1.005 (L) 1.005 - 1.030   pH, UA 6.0 5.0 - 7.5   Color, UA Yellow Yellow   Appearance Ur Clear Clear   Leukocytes, UA Negative Negative   Protein, UA Negative Negative/Trace   Glucose, UA Negative Negative   Ketones, UA Negative Negative   RBC, UA Trace (A) Negative   Bilirubin, UA Negative Negative   Urobilinogen, Ur 0.2 0.2 - 1.0 mg/dL  Nitrite, UA Negative Negative  CULTURE, URINE COMPREHENSIVE     Status:  Abnormal   Collection Time: 02/05/18 11:31 AM  Result Value Ref Range   Urine Culture, Comprehensive Final report (A)    Organism ID, Bacteria Escherichia coli (A)     Comment: 300 Colonies/mL Cefazolin <=4 ug/mL Cefazolin with an MIC <=16 predicts susceptibility to the oral agents cefaclor, cefdinir, cefpodoxime, cefprozil, cefuroxime, cephalexin, and loracarbef when used for therapy of uncomplicated urinary tract infections due to E. coli, Klebsiella pneumoniae, and Proteus mirabilis.    Organism ID, Bacteria Comment (A)     Comment: Corynebacterium species 10,000-25,000 colony forming units per mL Susceptibility not normally performed on this organism.    ANTIMICROBIAL SUSCEPTIBILITY Comment     Comment:       ** S = Susceptible; I = Intermediate; R = Resistant **                    P = Positive; N = Negative             MICS are expressed in micrograms per mL    Antibiotic                 RSLT#1    RSLT#2    RSLT#3    RSLT#4 Amoxicillin/Clavulanic Acid    S Ampicillin                     S Cefepime                       S Ceftriaxone                    S Cefuroxime                     S Ciprofloxacin                  S Ertapenem                      S Gentamicin                     S Imipenem                       S Levofloxacin                   S Meropenem                      S Nitrofurantoin                 S Piperacillin/Tazobactam        S Tetracycline                   S Tobramycin                     S Trimethoprim/Sulfa             S   Urinalysis, Complete     Status: Abnormal   Collection Time: 02/19/18 11:18 AM  Result Value Ref Range   Specific Gravity, UA <1.005 (L) 1.005 - 1.030   pH, UA 5.0 5.0 - 7.5   Color, UA Yellow Yellow   Appearance Ur Clear Clear   Leukocytes, UA Negative Negative   Protein, UA Negative Negative/Trace   Glucose,  UA Negative Negative   Ketones, UA Negative Negative   RBC, UA Trace (A) Negative   Bilirubin, UA Negative  Negative   Urobilinogen, Ur 0.2 0.2 - 1.0 mg/dL   Nitrite, UA Negative Negative   Microscopic Examination See below:   Microscopic Examination     Status: Abnormal   Collection Time: 02/19/18 11:18 AM  Result Value Ref Range   WBC, UA None seen 0 - 5 /hpf   RBC, UA None seen 0 - 2 /hpf   Epithelial Cells (non renal) 0-10 0 - 10 /hpf   Mucus, UA Present (A) Not Estab.   Bacteria, UA Few (A) None seen/Few  Urinalysis, Complete     Status: Abnormal   Collection Time: 02/21/18  3:04 PM  Result Value Ref Range   Specific Gravity, UA 1.020 1.005 - 1.030   pH, UA 5.5 5.0 - 7.5   Color, UA Yellow Yellow   Appearance Ur Clear Clear   Leukocytes, UA 1+ (A) Negative   Protein, UA Negative Negative/Trace   Glucose, UA Negative Negative   Ketones, UA Negative Negative   RBC, UA Trace (A) Negative   Bilirubin, UA Negative Negative   Urobilinogen, Ur 0.2 0.2 - 1.0 mg/dL   Nitrite, UA Negative Negative   Microscopic Examination See below:   Microscopic Examination     Status: None   Collection Time: 02/21/18  3:04 PM  Result Value Ref Range   WBC, UA 0-5 0 - 5 /hpf   RBC, UA 0-2 0 - 2 /hpf   Epithelial Cells (non renal) 0-10 0 - 10 /hpf   Bacteria, UA None seen None seen/Few   Objective  Body mass index is 25.88 kg/m. Wt Readings from Last 3 Encounters:  03/02/18 132 lb 8 oz (60.1 kg)  02/21/18 133 lb (60.3 kg)  02/05/18 130 lb (59 kg)   Temp Readings from Last 3 Encounters:  03/02/18 98 F (36.7 C) (Oral)  01/15/18 98.4 F (36.9 C) (Oral)  12/21/17 98.4 F (36.9 C) (Oral)   BP Readings from Last 3 Encounters:  03/02/18 122/64  02/21/18 (!) 151/84  02/19/18 (!) 146/68   Pulse Readings from Last 3 Encounters:  03/02/18 76  02/21/18 72  02/19/18 76    Physical Exam  Constitutional: She is oriented to person, place, and time. Vital signs are normal. She appears well-developed and well-nourished. She is cooperative.  HENT:  Head: Normocephalic and atraumatic.    Mouth/Throat: Oropharynx is clear and moist and mucous membranes are normal.  Eyes: Pupils are equal, round, and reactive to light. Conjunctivae are normal.  Cardiovascular: Normal rate, regular rhythm and normal heart sounds.  Pulmonary/Chest: Effort normal and breath sounds normal.  Neurological: She is alert and oriented to person, place, and time. She has normal strength. Gait normal.  Skin: Skin is warm, dry and intact.     0.2 cm papule blue to left cheek ? Etiology   Psychiatric: She has a normal mood and affect. Her speech is normal and behavior is normal. Judgment and thought content normal. Cognition and memory are normal.  Nursing note and vitals reviewed.   Assessment   1. Hematuria + UTI E coli 02/05/18 s/p cystoscopy 02/21/18 neg Dr. Elenor Quinones  2. Left cheek skin lesion  3. HM Plan   1.  Urine culture repeat today. UA today Yearly UA  See HPI  2. Refer to [facility] dermatology  2.  Had flu and pna vaccines x 2  Had zostervax given  Rx shingrix today  Tdap had 12/25/17  pna 12 due 08/2018  Out of pap window s/p total hysterectomy just saw OB/GYN 12/2017 mammo neg 11/07/17  Consider dexa in future 08/31/11 +osteopenia need to check vitamin D in future  Colonoscopy: 01/22/16 Dr. Rayann Heman h/o colon polpys. colonoscopy was difficult per report and she didn't like the MD who did the procedure. Reviewed +IH and adhesions making exam difficult.  cologuard done 02/03/16 repeat due 02/03/2019 until age 43 reviewed with pt ok with this plan  Saw dermatology on [place] 06/16/17 had AK LN2 f/u in 1 year, recently f/u 12/2017 pending mohs   She is former smoker quit >15 years ago smoked 1 ppd x 35 yeas no FH lung cancer  -consider CT low dose if insurance will cover in future   Cont exercise 3x per week    Provider: Dr. Olivia Mackie McLean-Scocuzza-Internal Medicine

## 2018-03-02 NOTE — Progress Notes (Signed)
Pre-visit discussion using our clinic review tool. No additional management support is needed unless otherwise documented below in the visit note.  

## 2018-03-02 NOTE — Patient Instructions (Addendum)
Please schedule appt late 06/2018   Recombinant Zoster (Shingles) Vaccine, RZV: What You Need to Know 1. Why get vaccinated? Shingles (also called herpes zoster, or just zoster) is a painful skin rash, often with blisters. Shingles is caused by the varicella zoster virus, the same virus that causes chickenpox. After you have chickenpox, the virus stays in your body and can cause shingles later in life. You can't catch shingles from another person. However, a person who has never had chickenpox (or chickenpox vaccine) could get chickenpox from someone with shingles. A shingles rash usually appears on one side of the face or body and heals within 2 to 4 weeks. Its main symptom is pain, which can be severe. Other symptoms can include fever, headache, chills and upset stomach. Very rarely, a shingles infection can lead to pneumonia, hearing problems, blindness, brain inflammation (encephalitis), or death. For about 1 person in 5, severe pain can continue even long after the rash has cleared up. This long-lasting pain is called post-herpetic neuralgia (PHN). Shingles is far more common in people 46 years of age and older than in younger people, and the risk increases with age. It is also more common in people whose immune system is weakened because of a disease such as cancer, or by drugs such as steroids or chemotherapy. At least 1 million people a year in the [place] get shingles. 2. Shingles vaccine (recombinant) Recombinant shingles vaccine was approved by FDA in 2017 for the prevention of shingles. In clinical trials, it was more than 90% effective in preventing shingles. It can also reduce the likelihood of PHN. Two doses, 2 to 6 months apart, are recommended for adults 35 and older. This vaccine is also recommended for people who have already gotten the live shingles vaccine (Zostavax). There is no live virus in this vaccine. 3. Some people should not get this vaccine Tell your vaccine provider  if you:  Have any severe, life-threatening allergies. A person who has ever had a life-threatening allergic reaction after a dose of recombinant shingles vaccine, or has a severe allergy to any component of this vaccine, may be advised not to be vaccinated. Ask your health care provider if you want information about vaccine components.  Are pregnant or breastfeeding. There is not much information about use of recombinant shingles vaccine in pregnant or nursing women. Your healthcare provider might recommend delaying vaccination.  Are not feeling well. If you have a mild illness, such as a cold, you can probably get the vaccine today. If you are moderately or severely ill, you should probably wait until you recover. Your doctor can advise you.  4. Risks of a vaccine reaction With any medicine, including vaccines, there is a chance of reactions. After recombinant shingles vaccination, a person might experience:  Pain, redness, soreness, or swelling at the site of the injection  Headache, muscle aches, fever, shivering, fatigue  In clinical trials, most people got a sore arm with mild or moderate pain after vaccination, and some also had redness and swelling where they got the shot. Some people felt tired, had muscle pain, a headache, shivering, fever, stomach pain, or nausea. About 1 out of 6 people who got recombinant zoster vaccine experienced side effects that prevented them from doing regular activities. Symptoms went away on their own in about 2 to 3 days. Side effects were more common in younger people. You should still get the second dose of recombinant zoster vaccine even if you had one of these reactions  after the first dose. Other things that could happen after this vaccine:  People sometimes faint after medical procedures, including vaccination. Sitting or lying down for about 15 minutes can help prevent fainting and injuries caused by a fall. Tell your provider if you feel dizzy or have  vision changes or ringing in the ears.  Some people get shoulder pain that can be more severe and longer-lasting than routine soreness that can follow injections. This happens very rarely.  Any medication can cause a severe allergic reaction. Such reactions to a vaccine are estimated at about 1 in a million doses, and would happen within a few minutes to a few hours after the vaccination. As with any medicine, there is a very remote chance of a vaccine causing a serious injury or death. The safety of vaccines is always being monitored. For more information, visit: http://www.aguilar.org/ 5. What if there is a serious problem? What should I look for?  Look for anything that concerns you, such as signs of a severe allergic reaction, very high fever, or unusual behavior. Signs of a severe allergic reaction can include hives, swelling of the face and throat, difficulty breathing, a fast heartbeat, dizziness, and weakness. These would usually start a few minutes to a few hours after the vaccination. What should I do?  If you think it is a severe allergic reaction or other emergency that can't wait, call 9-1-1 and get to the nearest hospital. Otherwise, call your health care provider. Afterward, the reaction should be reported to the Vaccine Adverse Event Reporting System (VAERS). Your doctor should file this report, or you can do it yourself through the VAERS web site atwww.vaers.https://www.bray.com/ by calling 779-090-3399. VAERS does not give medical advice. 6. How can I learn more?  Ask your healthcare provider. He or she can give you the vaccine package insert or suggest other sources of information.  Call your local or state health department.  Contact the Centers for Disease Control and Prevention (CDC): ? Call (626)379-5012 (1-800-CDC-INFO) or ? Visit the CDC's website at http://hunter.com/ CDC Vaccine Information Statement (VIS) Recombinant Zoster Vaccine (12/05/2016) This information is  not intended to replace advice given to you by your health care provider. Make sure you discuss any questions you have with your health care provider. Document Released: 12/20/2016 Document Revised: 12/20/2016 Document Reviewed: 12/20/2016 Elsevier Interactive Patient Education  [employer].

## 2018-03-03 LAB — URINE CULTURE
MICRO NUMBER: 90573346
Result:: NO GROWTH
SPECIMEN QUALITY: ADEQUATE

## 2018-03-03 LAB — URINALYSIS, ROUTINE W REFLEX MICROSCOPIC
BILIRUBIN URINE: NEGATIVE
Bacteria, UA: NONE SEEN /HPF
Glucose, UA: NEGATIVE
Hyaline Cast: NONE SEEN /LPF
Ketones, ur: NEGATIVE
Leukocytes, UA: NEGATIVE
NITRITE: NEGATIVE
PROTEIN: NEGATIVE
SQUAMOUS EPITHELIAL / LPF: NONE SEEN /HPF (ref <=5)
Specific Gravity, Urine: 1.022 (ref 1.001–1.03)
WBC, UA: NONE SEEN /HPF (ref 0–5)
pH: <=5 (ref 5.0–8.0)

## 2018-03-07 ENCOUNTER — Encounter: Payer: Self-pay | Admitting: *Deleted

## 2018-03-07 ENCOUNTER — Encounter: Payer: Self-pay | Admitting: Internal Medicine

## 2018-03-20 ENCOUNTER — Ambulatory Visit: Payer: Medicare Other | Admitting: Internal Medicine

## 2018-03-26 DIAGNOSIS — L821 Other seborrheic keratosis: Secondary | ICD-10-CM | POA: Diagnosis not present

## 2018-03-26 DIAGNOSIS — Z85828 Personal history of other malignant neoplasm of skin: Secondary | ICD-10-CM | POA: Diagnosis not present

## 2018-03-26 DIAGNOSIS — Z08 Encounter for follow-up examination after completed treatment for malignant neoplasm: Secondary | ICD-10-CM | POA: Diagnosis not present

## 2018-03-26 DIAGNOSIS — L538 Other specified erythematous conditions: Secondary | ICD-10-CM | POA: Diagnosis not present

## 2018-03-26 DIAGNOSIS — L820 Inflamed seborrheic keratosis: Secondary | ICD-10-CM | POA: Diagnosis not present

## 2018-04-10 DIAGNOSIS — M7542 Impingement syndrome of left shoulder: Secondary | ICD-10-CM | POA: Diagnosis not present

## 2018-04-10 DIAGNOSIS — R52 Pain, unspecified: Secondary | ICD-10-CM | POA: Diagnosis not present

## 2018-04-10 HISTORY — DX: Impingement syndrome of left shoulder: M75.42

## 2018-05-08 DIAGNOSIS — M7542 Impingement syndrome of left shoulder: Secondary | ICD-10-CM | POA: Diagnosis not present

## 2018-05-31 ENCOUNTER — Telehealth: Payer: Self-pay | Admitting: Internal Medicine

## 2018-05-31 NOTE — Telephone Encounter (Signed)
Copied from [place] [id number]. Topic: Quick Communication - See Telephone Encounter >> May 31, 2018  1:02 PM Mylinda Latina, NT wrote: CRM for notification. See Telephone encounter for: 05/31/18  Ernie calling from [place] states the patient takes Lunesta 2 mg and her insurance will only cover 30 tablets in a year . It will needs an override please call to provide that  . [employer] tel # [phone]

## 2018-05-31 NOTE — Telephone Encounter (Signed)
Patient needs PA  For lunesta.

## 2018-06-05 NOTE — Telephone Encounter (Signed)
Annette Lee is not in patients chart.

## 2018-07-10 ENCOUNTER — Ambulatory Visit: Payer: Medicare Other | Admitting: Internal Medicine

## 2018-07-16 ENCOUNTER — Ambulatory Visit (INDEPENDENT_AMBULATORY_CARE_PROVIDER_SITE_OTHER): Payer: Medicare Other

## 2018-07-16 VITALS — BP 122/74 | HR 78 | Temp 98.3°F | Resp 15 | Ht 64.5 in | Wt 130.1 lb

## 2018-07-16 DIAGNOSIS — L570 Actinic keratosis: Secondary | ICD-10-CM | POA: Diagnosis not present

## 2018-07-16 DIAGNOSIS — Z23 Encounter for immunization: Secondary | ICD-10-CM | POA: Diagnosis not present

## 2018-07-16 DIAGNOSIS — D2261 Melanocytic nevi of right upper limb, including shoulder: Secondary | ICD-10-CM | POA: Diagnosis not present

## 2018-07-16 DIAGNOSIS — G8929 Other chronic pain: Secondary | ICD-10-CM

## 2018-07-16 DIAGNOSIS — X32XXXA Exposure to sunlight, initial encounter: Secondary | ICD-10-CM | POA: Diagnosis not present

## 2018-07-16 DIAGNOSIS — Z0000 Encounter for general adult medical examination without abnormal findings: Secondary | ICD-10-CM | POA: Diagnosis not present

## 2018-07-16 DIAGNOSIS — M25512 Pain in left shoulder: Secondary | ICD-10-CM | POA: Diagnosis not present

## 2018-07-16 DIAGNOSIS — D2271 Melanocytic nevi of right lower limb, including hip: Secondary | ICD-10-CM | POA: Diagnosis not present

## 2018-07-16 DIAGNOSIS — D2272 Melanocytic nevi of left lower limb, including hip: Secondary | ICD-10-CM | POA: Diagnosis not present

## 2018-07-16 DIAGNOSIS — L821 Other seborrheic keratosis: Secondary | ICD-10-CM | POA: Diagnosis not present

## 2018-07-16 DIAGNOSIS — Z08 Encounter for follow-up examination after completed treatment for malignant neoplasm: Secondary | ICD-10-CM | POA: Diagnosis not present

## 2018-07-16 DIAGNOSIS — D2262 Melanocytic nevi of left upper limb, including shoulder: Secondary | ICD-10-CM | POA: Diagnosis not present

## 2018-07-16 DIAGNOSIS — Z85828 Personal history of other malignant neoplasm of skin: Secondary | ICD-10-CM | POA: Diagnosis not present

## 2018-07-16 NOTE — Progress Notes (Signed)
Agree with note below   TMS 

## 2018-07-16 NOTE — Patient Instructions (Addendum)
  Ms. Suttles , Thank you for taking time to come for your Medicare Wellness Visit. I appreciate your ongoing commitment to your health goals. Please review the following plan we discussed and let me know if I can assist you in the future.   Follow up as needed.    Bring a copy of your [place] and/or Living Will to be scanned into chart.  Have a great day!  These are the goals we discussed: Goals    . Maintain Healthy Lifestyle     Stay active Stay hydrated Healthy diet       This is a list of the screening recommended for you and due dates:  Health Maintenance  Topic Date Due  . Tetanus Vaccine  12/26/2027  . Flu Shot  Completed  . DEXA scan (bone density measurement)  Completed  . Pneumonia vaccines  Completed

## 2018-07-16 NOTE — Progress Notes (Signed)
Subjective:   Annette Lee is a 82 y.o. female who presents for Medicare Annual (Subsequent) preventive examination.  Review of Systems:  No ROS.  Medicare Wellness Visit. Additional risk factors are reflected in the social history.  Cardiac Risk Factors include: advanced age (>52men, >17 women)     Objective:     Vitals: BP 122/74 (BP Location: Left Arm, Patient Position: Sitting, Cuff Size: Normal)   Pulse 78   Temp 98.3 F (36.8 C) (Oral)   Resp 15   Ht 5' 4.5" (1.638 m)   Wt 130 lb 1.9 oz (59 kg)   SpO2 97%   BMI 21.99 kg/m   Body mass index is 21.99 kg/m.  Advanced Directives 07/16/2018 07/10/2017 06/29/2016  Does Patient Have a Medical Advance Directive? Yes Yes Yes  Type of Paramedic of [place];Living will [place];Living will [place];Living will  Does patient want to make changes to medical advance directive? No - Patient declined - No - Patient declined  Copy of [place] in Chart? No - copy requested Yes No - copy requested    Tobacco Social History   Tobacco Use  Smoking Status Former Smoker  Smokeless Tobacco Never Used  Tobacco Comment   as of 08/2017 quit smoking >15 years ago 1ppd x 35 years no FH lung cancer      Counseling given: Not Answered Comment: as of 08/2017 quit smoking >15 years ago 1ppd x 35 years no FH lung cancer    Clinical Intake:  Pre-visit preparation completed: Yes  Pain : 0-10 Pain Score: 5 (Pain with movement in the wrong position for the last 6 months) Pain Type: Chronic pain Pain Location: Shoulder Pain Orientation: Left Pain Onset: More than a month ago Pain Relieving Factors: Injections and medication po prn.  Followed by pcp.  Effect of Pain on Daily Activities: Increases activity slowly with range of motion.  Pain Relieving Factors: Injections and medication po prn.  Followed by pcp.   Nutritional Status: BMI of 19-24   Normal Diabetes: No  How often do you need to have someone help you when you read instructions, pamphlets, or other written materials from your doctor or pharmacy?: 1 - Never  Interpreter Needed?: No     Past Medical History:  Diagnosis Date  . Allergic rhinitis due to pollen   . 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 Endometriosis   . Hyperlipidemia   . Osteopenia 11/12   T -1.8 in hip  . Sleep disturbance   . UTI (urinary tract infection)   . Vaginal atrophy    Past Surgical History:  Procedure Laterality Date  . ABDOMINAL HYSTERECTOMY     total   . APPENDECTOMY    . CATARACT EXTRACTION W/ INTRAOCULAR LENS  IMPLANT, BILATERAL    . COLONOSCOPY WITH PROPOFOL N/A 01/22/2016   Procedure: COLONOSCOPY WITH PROPOFOL;  Surgeon: Josefine Class, MD;  Location: [facility] END[facility];  Service: Endoscopy;  Laterality: N/A;  . ROTATOR CUFF REPAIR Right ~2001   Family History  Problem Relation Age of Onset  . Cancer Mother        CLL  . Cancer Father        colon  . Aneurysm Father   . Cancer Brother        colon cancer  . Heart disease Maternal Uncle   . Diabetes Neg Hx   . Breast  cancer Neg Hx   . Bladder Cancer Neg Hx   . Kidney cancer Neg Hx    Social History   Socioeconomic History  . Marital status: Married    Spouse name: Not on file  . Number of children: 0  . Years of education: Not on file  . Highest education level: Not on file  Occupational History  . 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 Financial resource strain: Not hard at all  . Food insecurity:    Worry: Never true    Inability: Never true  . Transportation needs:    Medical: No    Non-medical: Not on file  Tobacco Use  . Smoking status: Former Research scientist (life sciences)  . Smokeless tobacco: Never Used  . Tobacco comment: as of 08/2017 quit smoking >15 years ago 1ppd x 35 years no FH lung cancer   Substance and Sexual Activity  . Alcohol  use: Yes    Alcohol/week: 3.0 standard drinks    Types: 3 Shots of liquor per week    Comment: drinks gin  . Drug use: No  . Sexual activity: Never  Lifestyle  . Physical activity:    Days per week: 4 days    Minutes per session: 60 min  . Stress: Not at all  Relationships  . Social connections:    Talks on phone: Not on file    Gets together: Not on file    Attends religious service: Not on file    Active member of club or organization: Not on file    Attends meetings of clubs or organizations: Not on file    Relationship status: Not on file  Other Topics Concern  . Not on file  Social History Narrative   Has living will   Husband is health care POA.   Would accept resuscitation but no prolonged ventilation   Not sure about tube feeds   Moved her from [place]    Exercises 3x per week    No kids     Outpatient Encounter Medications as of 07/16/2018  Medication Sig  . aspirin EC 81 MG tablet Take by mouth every 6 (six) hours as needed for moderate pain.  . Calcium Carbonate ([employer] CALCIUM-600 PO) Take 1 tablet by mouth.  . Cyanocobalamin (B-12 PO) Take 1 tablet by mouth daily.  . simvastatin (ZOCOR) 10 MG tablet Take 10 mg by mouth once a week.  . eszopiclone (LUNESTA) 2 MG TABS tablet TK 1 T PO QD HS PRN   No facility-administered encounter medications on file as of 07/16/2018.     Activities of Daily Living In your present state of health, do you have any difficulty performing the following activities: 07/16/2018  Hearing? N  Vision? N  Difficulty concentrating or making decisions? N  Walking or climbing stairs? N  Dressing or bathing? N  Doing errands, shopping? N  Preparing Food and eating ? N  Using the Toilet? N  In the past six months, have you accidently leaked urine? N  Do you have problems with loss of bowel control? N  Managing your Medications? N  Managing your Finances? N  Housekeeping or managing your Housekeeping? N  Some recent data might be  hidden    Patient Care Team: McLean-Scocuzza, Nino Glow, MD as PCP - General (Internal Medicine) Art Esperanza Sheets, MD as Consulting Physician (General Practice)    Assessment:   This is a routine wellness examination for [place].  The goal of  the wellness visit is to assist the patient how to close the gaps in care and create a preventative care plan for the patient.   The roster of all physicians providing medical care to patient is listed in the Snapshot section of the chart.  Taking calcium as appropriate/Osteoporosis risk reviewed.    Safety issues reviewed; Smoke and carbon monoxide detectors in the home. No firearms in the home. Wears seatbelts when driving or riding with others. No violence in the home.  They do not have excessive sun exposure.  Discussed the need for sun protection: hats, long sleeves and the use of sunscreen if there is significant sun exposure.  Patient is alert, normal appearance, oriented to person/place/and time.  Correctly identified the president of the [place] and recalls of 2/3 words. Performs simple calculations and can read correct time from watch face. Displays appropriate judgement.  No new identified risk were noted.  No failures at ADL's or IADL's.    BMI- discussed the importance of a healthy diet, water intake and the benefits of aerobic exercise. Educational material provided.   24 hour diet recall: Breakfast: Oatmeal, bananna Lunch: Crackers with cream cheese, walnuts  peanut butter/mayo, fruit, diet drink or milk.  Dinner: [employer], green salad, pasta, fruit.  Snack: nuts, popcorn.   Dental- every 6 months.  Sleep patterns- Sleeps well through the night.   Influenza vaccine administered L deltoid, tolerated well. No grimacing or verbal complaints during or post administration. Educational material provided.  Health maintenance gaps- closed.  Patient Concerns: L shoulder pain, chronic.  Received an injection this summer and was taking  Naproxen 500mg  but neither helped.  Reports aspirin taken for pain  does help.  Xray requested. Deferred to pcp and completed today.  Scheduled to return for follow up.     Exercise Activities and Dietary recommendations Current Exercise Habits: Home exercise routine, Type of exercise: walking;strength training/weights(Walks 1-3 miles, 3 times a week), Time (Minutes): 60, Frequency (Times/Week): 3, Weekly Exercise (Minutes/Week): 180, Intensity: Moderate  Goals    . Maintain Healthy Lifestyle     Stay active Stay hydrated Healthy diet       Fall Risk Fall Risk  07/16/2018 01/15/2018 07/10/2017 06/29/2016 07/13/2015  Falls in the past year? No No No No No   Depression Screen PHQ 2/9 Scores 07/16/2018 01/15/2018 07/10/2017 06/29/2016  PHQ - 2 Score 0 0 0 0  PHQ- 9 Score - - 0 -     Cognitive Function MMSE - Mini Mental State Exam 07/10/2017 06/29/2016  Orientation to time 5 5  Orientation to Place 5 5  Registration 3 3  Attention/ Calculation 0 0  Recall 3 3  Language- name 2 objects 0 0  Language- repeat 1 1  Language- follow 3 step command 3 3  Language- read & follow direction 0 0  Write a sentence 0 0  Copy design 0 0  Total score 20 20     6CIT Screen 07/16/2018  What Year? 0 points  What month? 0 points  What time? 0 points  Count back from 20 0 points  Months in reverse 0 points  Repeat phrase 0 points  Total Score 0    Immunization History  Administered Date(s) Administered  . Influenza Split 08/02/2013  . Influenza,inj,Quad PF,6+ Mos 07/08/2014, 07/13/2015, 06/29/2016, 07/10/2017, 07/16/2018  . Pneumococcal Conjugate-13 09/11/2014  . Pneumococcal Polysaccharide-23 09/10/2013  . Tdap 12/25/2017  . Zoster 09/11/2014   Screening Tests Health Maintenance  Topic Date Due  .  TETANUS/TDAP  12/26/2027  . INFLUENZA VACCINE  Completed  . DEXA SCAN  Completed  . PNA vac Low Risk Adult  Completed      Plan:    End of life planning; Advance aging; Advanced directives  discussed. Copy of current HCPOA/Living Will requested.    I have personally reviewed and noted the following in the patient's chart:   . Medical and social history . Use of alcohol, tobacco or illicit drugs  . Current medications and supplements . Functional ability and status . Nutritional status . Physical activity . Advanced directives . List of other physicians . Hospitalizations, surgeries, and ER visits in previous 12 months . Vitals . Screenings to include cognitive, depression, and falls . Referrals and appointments  In addition, I have reviewed and discussed with patient certain preventive protocols, quality metrics, and best practice recommendations. A written personalized care plan for preventive services as well as general preventive health recommendations were provided to patient.     Varney Biles, LPN  [id number]

## 2018-07-24 ENCOUNTER — Encounter: Payer: Self-pay | Admitting: Internal Medicine

## 2018-07-24 ENCOUNTER — Ambulatory Visit (INDEPENDENT_AMBULATORY_CARE_PROVIDER_SITE_OTHER): Payer: Medicare Other | Admitting: Internal Medicine

## 2018-07-24 VITALS — BP 134/68 | HR 77 | Temp 98.1°F | Ht 64.5 in | Wt 130.6 lb

## 2018-07-24 DIAGNOSIS — D751 Secondary polycythemia: Secondary | ICD-10-CM | POA: Diagnosis not present

## 2018-07-24 DIAGNOSIS — E559 Vitamin D deficiency, unspecified: Secondary | ICD-10-CM

## 2018-07-24 DIAGNOSIS — R739 Hyperglycemia, unspecified: Secondary | ICD-10-CM

## 2018-07-24 DIAGNOSIS — E785 Hyperlipidemia, unspecified: Secondary | ICD-10-CM | POA: Diagnosis not present

## 2018-07-24 DIAGNOSIS — Z1231 Encounter for screening mammogram for malignant neoplasm of breast: Secondary | ICD-10-CM | POA: Diagnosis not present

## 2018-07-24 DIAGNOSIS — M25512 Pain in left shoulder: Secondary | ICD-10-CM | POA: Diagnosis not present

## 2018-07-24 DIAGNOSIS — Z0000 Encounter for general adult medical examination without abnormal findings: Secondary | ICD-10-CM

## 2018-07-24 DIAGNOSIS — Z1159 Encounter for screening for other viral diseases: Secondary | ICD-10-CM

## 2018-07-24 DIAGNOSIS — Z0184 Encounter for antibody response examination: Secondary | ICD-10-CM

## 2018-07-24 DIAGNOSIS — Z1329 Encounter for screening for other suspected endocrine disorder: Secondary | ICD-10-CM | POA: Diagnosis not present

## 2018-07-24 DIAGNOSIS — G8929 Other chronic pain: Secondary | ICD-10-CM | POA: Diagnosis not present

## 2018-07-24 NOTE — Patient Instructions (Addendum)
Dr.Poggi [facility]  MRI Left shoulder Calcium 600 mg 2x per day  Vitamin D3 1000 IU daily  Schedule mammogram after 11/07/2018  sch fasting labs only water    Shoulder Pain Many things can cause shoulder pain, including:  An injury to the area.  Overuse of the shoulder.  Arthritis.  The source of the pain can be:  Inflammation.  An injury to the shoulder joint.  An injury to a tendon, ligament, or bone.  Follow these instructions at home: Take these actions to help with your pain:  Squeeze a soft ball or a foam pad as much as possible. This helps to keep the shoulder from swelling. It also helps to strengthen the arm.  Take over-the-counter and prescription medicines only as told by your health care provider.  If directed, apply ice to the area: ? Put ice in a plastic bag. ? Place a towel between your skin and the bag. ? Leave the ice on for 20 minutes, 2-3 times per day. Stop applying ice if it does not help with the pain.  If you were given a shoulder sling or immobilizer: ? Wear it as told. ? Remove it to shower or bathe. ? Move your arm as little as possible, but keep your hand moving to prevent swelling.  Contact a health care provider if:  Your pain gets worse.  Your pain is not relieved with medicines.  New pain develops in your arm, hand, or fingers. Get help right away if:  Your arm, hand, or fingers: ? Tingle. ? Become numb. ? Become swollen. ? Become painful. ? Turn white or blue. This information is not intended to replace advice given to you by your health care provider. Make sure you discuss any questions you have with your health care provider. Document Released: 07/20/2005 Document Revised: 06/05/2016 Document Reviewed: 02/02/2015 Elsevier Interactive Patient Education  [employer].

## 2018-07-24 NOTE — Progress Notes (Signed)
Pre visit review using our clinic review tool, if applicable. No additional management support is needed unless otherwise documented below in the visit note. 

## 2018-07-24 NOTE — Progress Notes (Signed)
Chief Complaint  Patient presents with  . Follow-up   F/u  1. C/o left shoulder pain since beginning summer had 1 shot w/o relief naproxen 500 mg does not help, aspirin helps pain is mild worse with ROM and reaching behind back pain was aggravated by quilting and doing rotational exercises with arms Xray negative  2. Hematuria w/u neg cystoscopy with urology rec repeat UA yearly and refer back if > 3 rbcs per hpf  Review of Systems  Constitutional: Negative for weight loss.  HENT: Negative for hearing loss.   Eyes: Negative for blurred vision and discharge.  Respiratory: Negative for shortness of breath.   Cardiovascular: Negative for chest pain.  Gastrointestinal: Negative for abdominal pain.  Musculoskeletal: Positive for joint pain.  Skin: Negative for rash.  Neurological: Negative for headaches.  Psychiatric/Behavioral: Negative for depression.   Past Medical History:  Diagnosis Date  . Allergic rhinitis due to pollen   . 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 Endometriosis   . Hyperlipidemia   . Osteopenia 11/12   T -1.8 in hip  . Sleep disturbance   . UTI (urinary tract infection)   . Vaginal atrophy    Past Surgical History:  Procedure Laterality Date  . ABDOMINAL HYSTERECTOMY     total   . APPENDECTOMY    . CATARACT EXTRACTION W/ INTRAOCULAR LENS  IMPLANT, BILATERAL    . COLONOSCOPY WITH PROPOFOL N/A 01/22/2016   Procedure: COLONOSCOPY WITH PROPOFOL;  Surgeon: Josefine Class, MD;  Location: [facility] ENDOSCOPY;  Service: Endoscopy;  Laterality: N/A;  . ROTATOR CUFF REPAIR Right ~2001   Family History  Problem Relation Age of Onset  . Cancer Mother        CLL  . Cancer Father        colon  . Aneurysm Father   . Cancer Brother        colon cancer  . Heart disease Maternal Uncle   . Diabetes Neg Hx   . Breast cancer Neg Hx   . Bladder Cancer Neg Hx   . Kidney cancer Neg Hx    Social History   Socioeconomic History  . Marital  status: Married    Spouse name: Not on file  . Number of children: 0  . Years of education: Not on file  . Highest education level: Not on file  Occupational History  . 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 Financial resource strain: Not hard at all  . Food insecurity:    Worry: Never true    Inability: Never true  . Transportation needs:    Medical: No    Non-medical: Not on file  Tobacco Use  . Smoking status: Former Research scientist (life sciences)  . Smokeless tobacco: Never Used  . Tobacco comment: as of 08/2017 quit smoking >15 years ago 1ppd x 35 years no FH lung cancer   Substance and Sexual Activity  . Alcohol use: Yes    Alcohol/week: 3.0 standard drinks    Types: 3 Shots of liquor per week    Comment: drinks gin  . Drug use: No  . Sexual activity: Never  Lifestyle  . Physical activity:    Days per week: 4 days    Minutes per session: 60 min  . Stress: Not at all  Relationships  . Social connections:    Talks on phone: Not on file    Gets together: Not on file  Attends religious service: Not on file    Active member of club or organization: Not on file    Attends meetings of clubs or organizations: Not on file    Relationship status: Not on file  . Intimate partner violence:    Fear of current or ex partner: No    Emotionally abused: No    Physically abused: No    Forced sexual activity: No  Other Topics Concern  . Not on file  Social History Narrative   Has living will   Husband is health care POA.   Would accept resuscitation but no prolonged ventilation   Not sure about tube feeds   Moved her from [place]    Exercises 3x per week    No kids    Current Meds  Medication Sig  . aspirin EC 81 MG tablet Take by mouth daily.   . Calcium Carbonate ([employer] CALCIUM-600 PO) Take 1 tablet by mouth.  . Cyanocobalamin (B-12 PO) Take 1 tablet by mouth daily.  . eszopiclone (LUNESTA) 2 MG TABS tablet TK 1 T PO QD HS PRN  .  oxymetazoline (AFRIN) 0.05 % nasal spray Place 1 spray into both nostrils 2 (two) times daily as needed for congestion.  . simvastatin (ZOCOR) 10 MG tablet Take 10 mg by mouth once a week.   Allergies  Allergen Reactions  . Contrast Media [Iodinated Diagnostic Agents] Anaphylaxis   No results found for this or any previous visit (from the past 2160 hour(s)). Objective  Body mass index is 22.07 kg/m. Wt Readings from Last 3 Encounters:  07/24/18 130 lb 9.6 oz (59.2 kg)  07/16/18 130 lb 1.9 oz (59 kg)  03/02/18 132 lb 8 oz (60.1 kg)   Temp Readings from Last 3 Encounters:  07/24/18 98.1 F (36.7 C) (Oral)  07/16/18 98.3 F (36.8 C) (Oral)  03/02/18 98 F (36.7 C) (Oral)   BP Readings from Last 3 Encounters:  07/24/18 134/68  07/16/18 122/74  03/02/18 122/64   Pulse Readings from Last 3 Encounters:  07/24/18 77  07/16/18 78  03/02/18 76    Physical Exam  Constitutional: She is oriented to person, place, and time. Vital signs are normal. She appears well-developed and well-nourished. She is cooperative. No distress.  HENT:  Head: Normocephalic and atraumatic.  Mouth/Throat: Oropharynx is clear and moist and mucous membranes are normal.  Eyes: Pupils are equal, round, and reactive to light. Conjunctivae are normal.  Cardiovascular: Normal rate, regular rhythm and normal heart sounds.  Pulmonary/Chest: Effort normal and breath sounds normal.  Musculoskeletal:       Left shoulder: She exhibits tenderness. She exhibits normal range of motion.       Arms: Pain with reaching behind back   Neurological: She is alert and oriented to person, place, and time. Gait normal.  Skin: Skin is warm, dry and intact.  Psychiatric: She has a normal mood and affect. Her speech is normal and behavior is normal. Judgment and thought content normal. Cognition and memory are normal.  Nursing note and vitals reviewed.   Assessment   1. Left shoulder pain r/o rotator cuff etiology  2.  Hematuria see hpi 3. HM Plan   1. MRI  Consider referral Dr. Roland Rack  otc meds for pain  2. Refer urology prn check UA due 02/2019  3. Flu shad 07/16/18  Had pna vaccines x 2  Had zostervax given Rx shingrix had not had  Tdaphad 12/25/17 pna 12 due 08/2018   Out of  pap window s/p total hysterectomyjust saw OB/GYN 12/2017 mammo neg 11/07/17 referred today  Consider dexa in future 08/31/11 +osteopenia need to check vitamin D in future  Colonoscopy: 01/22/16 Dr. Rayann Heman h/o colon polpys. colonoscopy was difficult per report and she didn't like the MD who did the procedure. Reviewed +IH and adhesions making exam difficult.  cologuard done 02/03/16 repeat due 02/03/2019 until age 44 reviewed with pt ok with this plan  Saw dermatology on [place] 06/16/17 had AK LN2 f/u in 1 year, recently f/u 12/2017 pending mohs  She is former smoker quit >15 years ago smoked 1 ppd x 35 yeas no FH lung cancer  -consider CT low dose if insurance will cover in future  sch fasting labs  Cont exercise 3x per week  Provider: Dr. Olivia Mackie McLean-Scocuzza-Internal Medicine

## 2018-08-03 ENCOUNTER — Other Ambulatory Visit: Payer: Medicare Other

## 2018-08-08 ENCOUNTER — Ambulatory Visit: Payer: Medicare Other

## 2018-08-08 ENCOUNTER — Other Ambulatory Visit (INDEPENDENT_AMBULATORY_CARE_PROVIDER_SITE_OTHER): Payer: Medicare Other

## 2018-08-08 DIAGNOSIS — Z1159 Encounter for screening for other viral diseases: Secondary | ICD-10-CM | POA: Diagnosis not present

## 2018-08-08 DIAGNOSIS — D751 Secondary polycythemia: Secondary | ICD-10-CM

## 2018-08-08 DIAGNOSIS — Z0000 Encounter for general adult medical examination without abnormal findings: Secondary | ICD-10-CM

## 2018-08-08 DIAGNOSIS — Z0184 Encounter for antibody response examination: Secondary | ICD-10-CM | POA: Diagnosis not present

## 2018-08-08 DIAGNOSIS — E559 Vitamin D deficiency, unspecified: Secondary | ICD-10-CM

## 2018-08-08 DIAGNOSIS — R739 Hyperglycemia, unspecified: Secondary | ICD-10-CM

## 2018-08-08 DIAGNOSIS — Z1329 Encounter for screening for other suspected endocrine disorder: Secondary | ICD-10-CM

## 2018-08-08 DIAGNOSIS — E785 Hyperlipidemia, unspecified: Secondary | ICD-10-CM

## 2018-08-08 LAB — COMPREHENSIVE METABOLIC PANEL
ALK PHOS: 53 U/L (ref 39–117)
ALT: 10 U/L (ref 0–35)
AST: 18 U/L (ref 0–37)
Albumin: 4 g/dL (ref 3.5–5.2)
BUN: 16 mg/dL (ref 6–23)
CHLORIDE: 109 meq/L (ref 96–112)
CO2: 29 meq/L (ref 19–32)
Calcium: 9.5 mg/dL (ref 8.4–10.5)
Creatinine, Ser: 0.75 mg/dL (ref 0.40–1.20)
GFR: 78.99 mL/min (ref >60.00)
GLUCOSE: 103 mg/dL — AB (ref 70–99)
POTASSIUM: 4 meq/L (ref 3.5–5.1)
SODIUM: 144 meq/L (ref 135–145)
Total Bilirubin: 0.7 mg/dL (ref 0.2–1.2)
Total Protein: 6.7 g/dL (ref 6.0–8.3)

## 2018-08-08 LAB — CBC WITH DIFFERENTIAL/PLATELET
Basophils Absolute: 0 10*3/uL (ref 0.0–0.1)
Basophils Relative: 0.8 % (ref 0.0–3.0)
Eosinophils Absolute: 0.2 10*3/uL (ref 0.0–0.7)
Eosinophils Relative: 3.2 % (ref 0.0–5.0)
HCT: 42.5 % (ref 36.0–46.0)
Hemoglobin: 14.3 g/dL (ref 12.0–15.0)
LYMPHS ABS: 1.2 10*3/uL (ref 0.7–4.0)
LYMPHS PCT: 21.5 % (ref 12.0–46.0)
MCHC: 33.8 g/dL (ref 30.0–36.0)
MCV: 92.2 fl (ref 78.0–100.0)
MONO ABS: 0.4 10*3/uL (ref 0.1–1.0)
Monocytes Relative: 7.4 % (ref 3.0–12.0)
NEUTROS PCT: 67.1 % (ref 43.0–77.0)
Neutro Abs: 3.9 10*3/uL (ref 1.4–7.7)
Platelets: 176 10*3/uL (ref 150.0–400.0)
RBC: 4.6 Mil/uL (ref 3.87–5.11)
RDW: 13 % (ref 11.5–15.5)
WBC: 5.8 10*3/uL (ref 4.0–10.5)

## 2018-08-08 LAB — LIPID PANEL
CHOLESTEROL: 171 mg/dL (ref 0–200)
HDL: 57.8 mg/dL (ref >39.00)
LDL CALC: 94 mg/dL (ref 0–99)
NonHDL: 112.89
Total CHOL/HDL Ratio: 3
Triglycerides: 93 mg/dL (ref 0.0–149.0)
VLDL: 18.6 mg/dL (ref 0.0–40.0)

## 2018-08-08 LAB — TSH: TSH: 2.4 u[IU]/mL (ref 0.35–4.50)

## 2018-08-08 LAB — VITAMIN D 25 HYDROXY (VIT D DEFICIENCY, FRACTURES): VITD: 34.08 ng/mL (ref 30.00–100.00)

## 2018-08-08 LAB — HEMOGLOBIN A1C: Hgb A1c MFr Bld: 5.5 % (ref 4.6–6.5)

## 2018-08-09 LAB — MEASLES/MUMPS/RUBELLA IMMUNITY
Mumps IgG: 139 AU/mL
Rubella: 18.4 index
Rubeola IgG: >300 AU/mL

## 2018-08-10 ENCOUNTER — Other Ambulatory Visit: Payer: Self-pay | Admitting: Internal Medicine

## 2018-08-10 DIAGNOSIS — M8580 Other specified disorders of bone density and structure, unspecified site: Secondary | ICD-10-CM

## 2018-08-10 DIAGNOSIS — E2839 Other primary ovarian failure: Secondary | ICD-10-CM

## 2018-08-15 ENCOUNTER — Ambulatory Visit
Admission: RE | Admit: 2018-08-15 | Discharge: 2018-08-15 | Disposition: A | Discharge status: Home / Self Care | Payer: Medicare Other | Source: Ambulatory Visit | Attending: Internal Medicine | Admitting: Internal Medicine

## 2018-08-15 DIAGNOSIS — M25512 Pain in left shoulder: Secondary | ICD-10-CM | POA: Insufficient documentation

## 2018-08-15 DIAGNOSIS — M75122 Complete rotator cuff tear or rupture of left shoulder, not specified as traumatic: Secondary | ICD-10-CM | POA: Insufficient documentation

## 2018-08-15 DIAGNOSIS — G8929 Other chronic pain: Secondary | ICD-10-CM | POA: Diagnosis not present

## 2018-08-16 ENCOUNTER — Encounter: Payer: Self-pay | Admitting: *Deleted

## 2018-08-21 ENCOUNTER — Other Ambulatory Visit: Payer: Self-pay | Admitting: Internal Medicine

## 2018-08-21 DIAGNOSIS — M67912 Unspecified disorder of synovium and tendon, left shoulder: Secondary | ICD-10-CM

## 2018-08-23 ENCOUNTER — Telehealth: Payer: Self-pay

## 2018-08-23 NOTE — Telephone Encounter (Signed)
It was submitted to [facility] for Dr. Roland Rack on 10/30 through [facility]. They review the notes and then contact pt to schedule.

## 2018-08-23 NOTE — Telephone Encounter (Signed)
Copied from [place] [id number]. Topic: Referral - Status >> Aug 23, 2018 10:59 AM Marin Olp L wrote: Patient says she contacted Dr. Roland Rack regarding her referral placed on 08/21/2018 and his office said they haven't received a referral. Please contact the office to fax the referral and supporting medical documentation as well as MRI results at the earliest convenience. The patient is anxious and would like a call back to notify her once this has been completed. Dr. Milagros Evener Phone: [phone]

## 2018-08-24 NOTE — Telephone Encounter (Signed)
Patient is calling and states Annette Lee at Dr. Roland Rack office states they have not received the disk with the patients MRI results. Please advise.

## 2018-08-28 NOTE — Telephone Encounter (Signed)
We do not have access to [facility] MRI's so we can not burn the disc. Pt was informed to go to [facility] and to ask for a copy of it.

## 2018-09-24 DIAGNOSIS — M7582 Other shoulder lesions, left shoulder: Secondary | ICD-10-CM | POA: Insufficient documentation

## 2018-09-24 DIAGNOSIS — M75122 Complete rotator cuff tear or rupture of left shoulder, not specified as traumatic: Secondary | ICD-10-CM | POA: Insufficient documentation

## 2018-09-24 HISTORY — DX: Other shoulder lesions, left shoulder: M75.82

## 2018-09-24 HISTORY — DX: Complete rotator cuff tear or rupture of left shoulder, not specified as traumatic: M75.122

## 2018-10-25 ENCOUNTER — Ambulatory Visit: Payer: Medicare Other | Admitting: Internal Medicine

## 2018-10-26 ENCOUNTER — Other Ambulatory Visit: Payer: Medicare Other

## 2018-10-30 ENCOUNTER — Encounter
Admission: RE | Admit: 2018-10-30 | Discharge: 2018-10-30 | Disposition: A | Discharge status: Home / Self Care | Payer: Medicare Other | Source: Ambulatory Visit | Attending: Surgery | Admitting: Surgery

## 2018-10-30 ENCOUNTER — Other Ambulatory Visit: Payer: Self-pay

## 2018-10-30 DIAGNOSIS — Z01818 Encounter for other preprocedural examination: Secondary | ICD-10-CM | POA: Insufficient documentation

## 2018-10-30 DIAGNOSIS — E785 Hyperlipidemia, unspecified: Secondary | ICD-10-CM | POA: Diagnosis not present

## 2018-10-30 HISTORY — DX: Other allergic rhinitis: J30.89

## 2018-10-30 HISTORY — DX: Gastro-esophageal reflux disease without esophagitis: K21.9

## 2018-10-30 LAB — BASIC METABOLIC PANEL
Anion gap: 6 (ref 5–15)
BUN: 18 mg/dL (ref 8–23)
CO2: 27 mmol/L (ref 22–32)
CREATININE: 0.68 mg/dL (ref 0.44–1.00)
Calcium: 9.3 mg/dL (ref 8.9–10.3)
Chloride: 107 mmol/L (ref 98–111)
GFR calc Af Amer: >60 mL/min (ref >60)
GFR calc non Af Amer: >60 mL/min (ref >60)
GLUCOSE: 80 mg/dL (ref 70–99)
Potassium: 3.5 mmol/L (ref 3.5–5.1)
Sodium: 140 mmol/L (ref 135–145)

## 2018-10-30 LAB — CBC
HCT: 42.8 % (ref 36.0–46.0)
Hemoglobin: 13.8 g/dL (ref 12.0–15.0)
MCH: 30.1 pg (ref 26.0–34.0)
MCHC: 32.2 g/dL (ref 30.0–36.0)
MCV: 93.4 fL (ref 80.0–100.0)
Platelets: 180 10*3/uL (ref 150–400)
RBC: 4.58 MIL/uL (ref 3.87–5.11)
RDW: 12.6 % (ref 11.5–15.5)
WBC: 6.4 10*3/uL (ref 4.0–10.5)
nRBC: 0 % (ref 0.0–0.2)

## 2018-10-30 NOTE — Patient Instructions (Signed)
Your procedure is scheduled on: 11/08/18 Thurs Report to Same Day Surgery 2nd floor medical mall [facility] Entrance-take elevator on left to 2nd floor.  Check in with surgery information desk.) To find out your arrival time please call [phone] between 1PM - 3PM on 1/15 Wed  Remember: Instructions that are not followed completely may result in serious medical risk, up to and including death, or upon the discretion of your surgeon and anesthesiologist your surgery may need to be rescheduled.    _x___ 1. Do not eat food after midnight the night before your procedure. You may drink clear liquids up to 2 hours before you are scheduled to arrive at the hospital for your procedure.  Do not drink clear liquids within 2 hours of your scheduled arrival to the hospital.  Clear liquids include  --Water or Apple juice without pulp  --Clear carbohydrate beverage such as ClearFast or Gatorade  --Black Coffee or Clear Tea (No milk, no creamers, do not add anything to                  the coffee or Tea Type 1 and type 2 diabetics should only drink water.   ____Ensure clear carbohydrate drink on the way to the hospital for bariatric patients  ____Ensure clear carbohydrate drink 3 hours before surgery for Dr Dwyane Luo patients if physician instructed.   No gum chewing or hard candies.     __x__ 2. No Alcohol for 24 hours before or after surgery.   __x__3. No Smoking or e-cigarettes for 24 prior to surgery.  Do not use any chewable tobacco products for at least 6 hour prior to surgery   ____  4. Bring all medications with you on the day of surgery if instructed.    __x__ 5. Notify your doctor if there is any change in your medical condition     (cold, fever, infections).    x___6. On the morning of surgery brush your teeth with toothpaste and water.  You may rinse your mouth with mouth wash if you wish.  Do not swallow any toothpaste or mouthwash.   Do not wear jewelry, make-up, hairpins,  clips or nail polish.  Do not wear lotions, powders, or perfumes. You may wear deodorant.  Do not shave 48 hours prior to surgery. Men may shave face and neck.  Do not bring valuables to the hospital.    [facility] is not responsible for any belongings or valuables.               Contacts, dentures or bridgework may not be worn into surgery.  Leave your suitcase in the car. After surgery it may be brought to your room.  For patients admitted to the hospital, discharge time is determined by your                       treatment team.  _  Patients discharged the day of surgery will not be allowed to drive home.  You will need someone to drive you home and stay with you the night of your procedure.    Please read over the following fact sheets that you were given:   [facility] Preparing for Surgery and or MRSA Information   _x___ Take anti-hypertensive listed below, cardiac, seizure, asthma,     anti-reflux and psychiatric medicines. These include:  1. None  2.  3.  4.  5.  6.  ____Fleets enema or Magnesium Citrate as directed.  _x___ Use CHG Soap or sage wipes as directed on instruction sheet   ____ Use inhalers on the day of surgery and bring to hospital day of surgery  ____ Stop Metformin and Janumet 2 days prior to surgery.    ____ Take 1/2 of usual insulin dose the night before surgery and none on the morning     surgery.   _x___ Follow recommendations from Cardiologist, Pulmonologist or PCP regarding          stopping Aspirin, Coumadin, Plavix ,Eliquis, Effient, or Pradaxa, and Pletal.  X____Stop Anti-inflammatories such as Advil, Aleve, Ibuprofen, Motrin, Naproxen, Naprosyn, Goodies powders or aspirin products. OK to take Tylenol and                          Celebrex.   _x___ Stop supplements until after surgery.  But may continue Vitamin D, Vitamin B,       and multivitamin.   ____ Bring C-Pap to the hospital.

## 2018-11-02 ENCOUNTER — Ambulatory Visit (INDEPENDENT_AMBULATORY_CARE_PROVIDER_SITE_OTHER): Payer: Medicare Other | Admitting: Internal Medicine

## 2018-11-02 ENCOUNTER — Encounter: Payer: Self-pay | Admitting: Internal Medicine

## 2018-11-02 VITALS — BP 128/62 | HR 71 | Temp 97.5°F | Ht 64.0 in | Wt 130.6 lb

## 2018-11-02 DIAGNOSIS — L739 Follicular disorder, unspecified: Secondary | ICD-10-CM

## 2018-11-02 DIAGNOSIS — H6121 Impacted cerumen, right ear: Secondary | ICD-10-CM | POA: Diagnosis not present

## 2018-11-02 DIAGNOSIS — M67912 Unspecified disorder of synovium and tendon, left shoulder: Secondary | ICD-10-CM | POA: Diagnosis not present

## 2018-11-02 NOTE — Progress Notes (Signed)
Chief Complaint  Patient presents with  . Follow-up   F/u  1. Left shoulder surgery sch 11/08/2018 Dr. Roland Rack  2. Right thigh bump she is picking at it  3. No other issues    Review of Systems  Constitutional: Negative for weight loss.  HENT: Positive for tinnitus.   Eyes: Negative for blurred vision.  Respiratory: Negative for shortness of breath.   Cardiovascular: Negative for chest pain.  Gastrointestinal: Negative for abdominal pain.  Musculoskeletal: Negative for joint pain.  Skin: Negative for rash.  Neurological: Negative for headaches.  Psychiatric/Behavioral: Negative for depression.   Past Medical History:  Diagnosis Date  . Allergic rhinitis due to pollen   . 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 Endometriosis   . Environmental and seasonal allergies   . GERD (gastroesophageal reflux disease)   . Hyperlipidemia   . Osteopenia 11/12   T -1.8 in hip  . Sleep disturbance   . UTI (urinary tract infection)   . Vaginal atrophy    Past Surgical History:  Procedure Laterality Date  . ABDOMINAL HYSTERECTOMY     total   . APPENDECTOMY    . CATARACT EXTRACTION W/ INTRAOCULAR LENS  IMPLANT, BILATERAL    . COLONOSCOPY WITH PROPOFOL N/A 01/22/2016   Procedure: COLONOSCOPY WITH PROPOFOL;  Surgeon: Josefine Class, MD;  Location: [facility] ENDOSCOPY;  Service: Endoscopy;  Laterality: N/A;  . ROTATOR CUFF REPAIR Right ~2001   Family History  Problem Relation Age of Onset  . Cancer Mother        CLL  . Cancer Father        colon  . Aneurysm Father   . Cancer Brother        colon cancer  . Heart disease Maternal Uncle   . Diabetes Neg Hx   . Breast cancer Neg Hx   . Bladder Cancer Neg Hx   . Kidney cancer Neg Hx    Social History   Socioeconomic History  . Marital status: Married    Spouse name: Not on file  . Number of children: 0  . Years of education: Not on file  . Highest education level: Not on file  Occupational History  .  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 Financial resource strain: Not hard at all  . Food insecurity:    Worry: Never true    Inability: Never true  . Transportation needs:    Medical: No    Non-medical: Not on file  Tobacco Use  . Smoking status: Former Smoker    Last attempt to quit: 10/30/1966    Years since quitting: 52.0  . Smokeless tobacco: Never Used  . Tobacco comment: as of 08/2017 quit smoking >15 years ago 1ppd x 35 years no FH lung cancer   Substance and Sexual Activity  . Alcohol use: Yes    Alcohol/week: 3.0 standard drinks    Types: 3 Shots of liquor per week    Comment: drinks gin  . Drug use: No  . Sexual activity: Never  Lifestyle  . Physical activity:    Days per week: 4 days    Minutes per session: 60 min  . Stress: Not at all  Relationships  . Social connections:    Talks on phone: Not on file    Gets together: Not on file    Attends religious service: Not on file    Active member  of club or organization: Not on file    Attends meetings of clubs or organizations: Not on file    Relationship status: Not on file  . Intimate partner violence:    Fear of current or ex partner: No    Emotionally abused: No    Physically abused: No    Forced sexual activity: No  Other Topics Concern  . Not on file  Social History Narrative   Has living will   Husband is health care POA.   Would accept resuscitation but no prolonged ventilation   Not sure about tube feeds   Moved her from [place]    Exercises 3x per week    No kids    Current Meds  Medication Sig  . aspirin EC 325 MG tablet Take 650 mg by mouth every 8 (eight) hours as needed (pain.).  Marland Kitchen eszopiclone (LUNESTA) 2 MG TABS tablet Take 2 mg by mouth at bedtime as needed (for sleep.).   Marland Kitchen simvastatin (ZOCOR) 10 MG tablet Take 10 mg by mouth every Monday.    Allergies  Allergen Reactions  . Contrast Media [Iodinated Diagnostic Agents] Anaphylaxis    Recent Results (from the past 2160 hour(s))  Hemoglobin A1c     Status: None   Collection Time: 08/08/18  8:35 AM  Result Value Ref Range   Hgb A1c MFr Bld 5.5 4.6 - 6.5 %    Comment: Glycemic Control Guidelines for People with Diabetes:Non Diabetic:  <6%Goal of Therapy: <7%Additional Action Suggested:  >8%   Measles/Mumps/Rubella Immunity     Status: None   Collection Time: 08/08/18  8:35 AM  Result Value Ref Range   Rubeola IgG >300.00 AU/mL    Comment: AU/mL            Interpretation -----            -------------- <25.00           Negative 25.00-29.99      Equivocal >29.99           Positive . A positive result indicates that the patient has antibody to measles virus. It does not differentiate  between an active or past infection. The clinical  diagnosis must be interpreted in conjunction with  clinical signs and symptoms of the patient.    Mumps IgG 139.00 AU/mL    Comment:  AU/mL           Interpretation -------         ---------------- <9.00             Negative 9.00-10.99        Equivocal >10.99            Positive A positive result indicates that the patient has  antibody to mumps virus. It does not differentiate between an  active or past infection. The clinical diagnosis must be interpreted in conjunction with clinical signs and symptoms of the patient. .    Rubella 18.40 index    Comment:     Index            Interpretation     -----            --------------       <0.90            Not consistent with Immunity     0.90-0.99        Equivocal     > or = 1.00      Consistent with Immunity  .  The presence of rubella IgG antibody suggests  immunization or past or current infection with rubella virus.   Vitamin D (25 hydroxy)     Status: None   Collection Time: 08/08/18  8:35 AM  Result Value Ref Range   VITD 34.08 30.00 - 100.00 ng/mL  TSH     Status: None   Collection Time: 08/08/18  8:35 AM  Result Value Ref Range   TSH 2.40 0.35 - 4.50 uIU/mL  Lipid  panel     Status: None   Collection Time: 08/08/18  8:35 AM  Result Value Ref Range   Cholesterol 171 0 - 200 mg/dL    Comment: ATP III Classification       Desirable:  < 200 mg/dL               Borderline High:  200 - 239 mg/dL          High:  > = 240 mg/dL   Triglycerides 93.0 0.0 - 149.0 mg/dL    Comment: Normal:  <150 mg/dLBorderline High:  150 - 199 mg/dL   HDL 57.80 >39.00 mg/dL   VLDL 18.6 0.0 - 40.0 mg/dL   LDL Cholesterol 94 0 - 99 mg/dL   Total CHOL/HDL Ratio 3     Comment:                Men          Women1/2 Average Risk     3.4          3.3Average Risk          5.0          4.42X Average Risk          9.6          7.13X Average Risk          15.0          11.0                       NonHDL 112.89     Comment: NOTE:  Non-HDL goal should be 30 mg/dL higher than patient's LDL goal (i.e. LDL goal of < 70 mg/dL, would have non-HDL goal of < 100 mg/dL)  CBC with Differential/Platelet     Status: None   Collection Time: 08/08/18  8:35 AM  Result Value Ref Range   WBC 5.8 4.0 - 10.5 K/uL   RBC 4.60 3.87 - 5.11 Mil/uL   Hemoglobin 14.3 12.0 - 15.0 g/dL   HCT 42.5 36.0 - 46.0 %   MCV 92.2 78.0 - 100.0 fl   MCHC 33.8 30.0 - 36.0 g/dL   RDW 13.0 11.5 - 15.5 %   Platelets 176.0 150.0 - 400.0 K/uL   Neutrophils Relative % 67.1 43.0 - 77.0 %   Lymphocytes Relative 21.5 12.0 - 46.0 %   Monocytes Relative 7.4 3.0 - 12.0 %   Eosinophils Relative 3.2 0.0 - 5.0 %   Basophils Relative 0.8 0.0 - 3.0 %   Neutro Abs 3.9 1.4 - 7.7 K/uL   Lymphs Abs 1.2 0.7 - 4.0 K/uL   Monocytes Absolute 0.4 0.1 - 1.0 K/uL   Eosinophils Absolute 0.2 0.0 - 0.7 K/uL   Basophils Absolute 0.0 0.0 - 0.1 K/uL  Comprehensive metabolic panel     Status: Abnormal   Collection Time: 08/08/18  8:35 AM  Result Value Ref Range   Sodium 144 135 - 145 mEq/L   Potassium 4.0 3.5 -  5.1 mEq/L   Chloride 109 96 - 112 mEq/L   CO2 29 19 - 32 mEq/L   Glucose, Bld 103 (H) 70 - 99 mg/dL   BUN 16 6 - 23 mg/dL   Creatinine, Ser  0.75 0.40 - 1.20 mg/dL   Total Bilirubin 0.7 0.2 - 1.2 mg/dL   Alkaline Phosphatase 53 39 - 117 U/L   AST 18 0 - 37 U/L   ALT 10 0 - 35 U/L   Total Protein 6.7 6.0 - 8.3 g/dL   Albumin 4.0 3.5 - 5.2 g/dL   Calcium 9.5 8.4 - 10.5 mg/dL   GFR 78.99 >60.00 mL/min  CBC     Status: None   Collection Time: 10/30/18  9:40 AM  Result Value Ref Range   WBC 6.4 4.0 - 10.5 K/uL   RBC 4.58 3.87 - 5.11 MIL/uL   Hemoglobin 13.8 12.0 - 15.0 g/dL   HCT 42.8 36.0 - 46.0 %   MCV 93.4 80.0 - 100.0 fL   MCH 30.1 26.0 - 34.0 pg   MCHC 32.2 30.0 - 36.0 g/dL   RDW 12.6 11.5 - 15.5 %   Platelets 180 150 - 400 K/uL   nRBC 0.0 0.0 - 0.2 %    Comment: Performed at [facility], [address] [place], [place] [address]  Basic metabolic panel     Status: None   Collection Time: 10/30/18  9:40 AM  Result Value Ref Range   Sodium 140 135 - 145 mmol/L   Potassium 3.5 3.5 - 5.1 mmol/L   Chloride 107 98 - 111 mmol/L   CO2 27 22 - 32 mmol/L   Glucose, Bld 80 70 - 99 mg/dL   BUN 18 8 - 23 mg/dL   Creatinine, Ser 0.68 0.44 - 1.00 mg/dL   Calcium 9.3 8.9 - 10.3 mg/dL   GFR calc non Af Amer >60 >60 mL/min   GFR calc Af Amer >60 >60 mL/min   Anion gap 6 5 - 15    Comment: Performed at [facility], [address] [place], [place] [address]   Objective  Body mass index is 22.42 kg/m. Wt Readings from Last 3 Encounters:  11/02/18 130 lb 9.6 oz (59.2 kg)  10/30/18 128 lb (58.1 kg)  07/24/18 130 lb 9.6 oz (59.2 kg)   Temp Readings from Last 3 Encounters:  11/02/18 (!) 97.5 F (36.4 C) (Oral)  07/24/18 98.1 F (36.7 C) (Oral)  07/16/18 98.3 F (36.8 C) (Oral)   BP Readings from Last 3 Encounters:  11/02/18 128/62  10/30/18 140/73  07/24/18 134/68   Pulse Readings from Last 3 Encounters:  11/02/18 71  10/30/18 74  07/24/18 77    Physical Exam Vitals signs and nursing note reviewed.  Constitutional:      Appearance: Normal appearance. She is well-developed.  HENT:      Head: Normocephalic and atraumatic.     Ears:     Comments: Right ear>left ear wax not impacted     Nose: Nose normal.     Mouth/Throat:     Mouth: Mucous membranes are moist.     Pharynx: Oropharynx is clear.  Eyes:     Conjunctiva/sclera: Conjunctivae normal.     Pupils: Pupils are equal, round, and reactive to light.  Cardiovascular:     Rate and Rhythm: Normal rate and regular rhythm.     Heart sounds: Normal heart sounds.  Skin:    General: Skin is warm and dry.  Neurological:  General: No focal deficit present.     Mental Status: She is alert and oriented to person, place, and time.     Gait: Gait normal.  Psychiatric:        Attention and Perception: Attention and perception normal.        Mood and Affect: Mood and affect normal.        Speech: Speech normal.        Behavior: Behavior normal. Behavior is cooperative.        Thought Content: Thought content normal.        Cognition and Memory: Cognition and memory normal.        Judgment: Judgment normal.     Assessment   1. Left shoulder pain  2. Right thigh likely folliculitis  3. Cerumen w/o impaction R>L 4. HM Plan   1. sch surgery 11/08/2018 Dr. Roland Rack  2. Monitor  3.  Debrox  4.  Flu shad 07/16/18  Had pna vaccines x 2  Had zostervaxgiven Rx shingrix had not had  Tdaphad 12/25/17 pna 12 due 08/2018  Out of pap window s/p total hysterectomyjust saw OB/GYN 12/2017 mammo neg 11/07/17 referred call to schedule  Consider dexa in future 08/31/11 +osteopenia need to check vitamin D in future  Colonoscopy: 01/22/16 Dr. Rayann Heman h/o colon polpys. colonoscopy was difficult per report and she didn't like the MD who did the procedure. Reviewed +IH and adhesions making exam difficult.  cologuard done 02/03/16 repeat due 02/03/2019 until age 75 reviewed with pt ok with this plan will re ordered closer to date   Saw dermatology on [address] 06/16/17 had AK LN2 f/u in 1 year, recently f/u 12/2017 pending mohs  She is  former smoker quit >15 years ago smoked 1 ppd x 35 yeas no FH lung cancer  -consider CT low dose if insurance will cover in future    Cont exercise 3x per week Provider: Dr. Olivia Mackie McLean-Scocuzza-Internal Medicine

## 2018-11-02 NOTE — Patient Instructions (Addendum)
cologuard due 02/03/2019 please do not do before and also please do it on a Monday and mail back to carrier listed in the box   Debrox ear wax drops 4-7 days right ear worse for now. 1x per month if needed   Carbamide Peroxide ear solution What is this medicine? CARBAMIDE PEROXIDE (CAR bah mide per OX ide) is used to soften and help remove ear wax. This medicine may be used for other purposes; ask your health care provider or pharmacist if you have questions. COMMON BRAND NAME(S): Auro Ear, Auro Earache Relief, Debrox, Ear Drops, Ear Wax Removal, Ear Wax Remover, Earwax Treatment, Murine, Thera-Ear What should I tell my health care provider before I take this medicine? They need to know if you have any of these conditions: -dizziness -ear discharge -ear pain, irritation or rash -infection -perforated eardrum (hole in eardrum) -an unusual or allergic reaction to carbamide peroxide, glycerin, hydrogen peroxide, other medicines, foods, dyes, or preservatives -pregnant or trying to get pregnant -breast-feeding How should I use this medicine? This medicine is only for use in the outer ear canal. Follow the directions carefully. Wash hands before and after use. The solution may be warmed by holding the bottle in the hand for 1 to 2 minutes. Lie with the affected ear facing upward. Place the proper number of drops into the ear canal. After the drops are instilled, remain lying with the affected ear upward for 5 minutes to help the drops stay in the ear canal. A cotton ball may be gently inserted at the ear opening for no longer than 5 to 10 minutes to ensure retention. Repeat, if necessary, for the opposite ear. Do not touch the tip of the dropper to the ear, fingertips, or other surface. Do not rinse the dropper after use. Keep container tightly closed. Talk to your pediatrician regarding the use of this medicine in children. While this drug may be used in children as young as 12 years for selected  conditions, precautions do apply. Overdosage: If you think you have taken too much of this medicine contact a poison control center or emergency room at once. NOTE: This medicine is only for you. Do not share this medicine with others. What if I miss a dose? If you miss a dose, use it as soon as you can. If it is almost time for your next dose, use only that dose. Do not use double or extra doses. What may interact with this medicine? Interactions are not expected. Do not use any other ear products without asking your doctor or health care professional. This list may not describe all possible interactions. Give your health care provider a list of all the medicines, herbs, non-prescription drugs, or dietary supplements you use. Also tell them if you smoke, drink alcohol, or use illegal drugs. Some items may interact with your medicine. What should I watch for while using this medicine? This medicine is not for long-term use. Do not use for more than 4 days without checking with your health care professional. Contact your doctor or health care professional if your condition does not start to get better within a few days or if you notice burning, redness, itching or swelling. What side effects may I notice from receiving this medicine? Side effects that you should report to your doctor or health care professional as soon as possible: -allergic reactions like skin rash, itching or hives, swelling of the face, lips, or tongue -burning, itching, and redness -worsening ear pain -rash Side  effects that usually do not require medical attention (report to your doctor or health care professional if they continue or are bothersome): -abnormal sensation while putting the drops in the ear -temporary reduction in hearing (but not complete loss of hearing) This list may not describe all possible side effects. Call your doctor for medical advice about side effects. You may report side effects to FDA at  1-800-FDA-1088. Where should I keep my medicine? Keep out of the reach of children. Store at room temperature between 15 and 30 degrees C (59 and 86 degrees F) in a tight, light-resistant container. Keep bottle away from excessive heat and direct sunlight. Throw away any unused medicine after the expiration date. NOTE: This sheet is a summary. It may not cover all possible information. If you have questions about this medicine, talk to your doctor, pharmacist, or health care provider.  2019 Elsevier/Gold Standard (2008-01-22 14:00:02)

## 2018-11-02 NOTE — Progress Notes (Signed)
Pre visit review using our clinic review tool, if applicable. No additional management support is needed unless otherwise documented below in the visit note. 

## 2018-11-06 ENCOUNTER — Telehealth: Payer: Self-pay | Admitting: *Deleted

## 2018-11-06 NOTE — Telephone Encounter (Signed)
Copied from [place] [id number]. Topic: General - Inquiry >> Nov 05, 2018  3:58 PM Vernona Rieger wrote: Reason for CRM: Patient said she has instructions to do a colo-guard, patient would to know what she needs to do next.

## 2018-11-07 ENCOUNTER — Telehealth: Payer: Self-pay | Admitting: *Deleted

## 2018-11-07 MED ORDER — CEFAZOLIN SODIUM-DEXTROSE 2-4 GM/100ML-% IV SOLN
2.0000 g | Freq: Once | INTRAVENOUS | Status: AC
  Administered 2018-11-08: 2 g via INTRAVENOUS

## 2018-11-07 NOTE — Telephone Encounter (Signed)
Copied from [place] [id number]. Topic: General - Other >> Nov 07, 2018 11:44 AM Yvette Rack wrote: Reason for CRM: Pt called in requesting to speak with Fransisco Beau. Pt requests a return call. Cb# [phone]

## 2018-11-07 NOTE — Telephone Encounter (Signed)
Spoken to patient. Informed patient about the colo guard process. Patient had no further questions.

## 2018-11-07 NOTE — Telephone Encounter (Signed)
Spoken to patient and answered all questions.

## 2018-11-08 ENCOUNTER — Ambulatory Visit
Admission: RE | Admit: 2018-11-08 | Discharge: 2018-11-08 | Disposition: A | Discharge status: Home / Self Care | Payer: Medicare Other | Attending: Surgery | Admitting: Surgery

## 2018-11-08 ENCOUNTER — Ambulatory Visit: Payer: Medicare Other | Admitting: Anesthesiology

## 2018-11-08 ENCOUNTER — Other Ambulatory Visit: Payer: Self-pay

## 2018-11-08 ENCOUNTER — Encounter
Admission: RE | Disposition: A | Discharge status: Home / Self Care | Payer: Self-pay | Source: Home / Self Care | Attending: Surgery

## 2018-11-08 ENCOUNTER — Encounter: Payer: Self-pay | Admitting: *Deleted

## 2018-11-08 DIAGNOSIS — M25512 Pain in left shoulder: Secondary | ICD-10-CM | POA: Diagnosis not present

## 2018-11-08 DIAGNOSIS — M7522 Bicipital tendinitis, left shoulder: Secondary | ICD-10-CM | POA: Diagnosis not present

## 2018-11-08 DIAGNOSIS — Z9842 Cataract extraction status, left eye: Secondary | ICD-10-CM | POA: Insufficient documentation

## 2018-11-08 DIAGNOSIS — Z91041 Radiographic dye allergy status: Secondary | ICD-10-CM | POA: Insufficient documentation

## 2018-11-08 DIAGNOSIS — Z86010 Personal history of colonic polyps: Secondary | ICD-10-CM | POA: Insufficient documentation

## 2018-11-08 DIAGNOSIS — Z7982 Long term (current) use of aspirin: Secondary | ICD-10-CM | POA: Insufficient documentation

## 2018-11-08 DIAGNOSIS — J301 Allergic rhinitis due to pollen: Secondary | ICD-10-CM | POA: Insufficient documentation

## 2018-11-08 DIAGNOSIS — K219 Gastro-esophageal reflux disease without esophagitis: Secondary | ICD-10-CM | POA: Diagnosis not present

## 2018-11-08 DIAGNOSIS — Z79899 Other long term (current) drug therapy: Secondary | ICD-10-CM | POA: Diagnosis not present

## 2018-11-08 DIAGNOSIS — Z9841 Cataract extraction status, right eye: Secondary | ICD-10-CM | POA: Insufficient documentation

## 2018-11-08 DIAGNOSIS — G8918 Other acute postprocedural pain: Secondary | ICD-10-CM | POA: Diagnosis not present

## 2018-11-08 DIAGNOSIS — Z87891 Personal history of nicotine dependence: Secondary | ICD-10-CM | POA: Insufficient documentation

## 2018-11-08 DIAGNOSIS — Z90710 Acquired absence of both cervix and uterus: Secondary | ICD-10-CM | POA: Diagnosis not present

## 2018-11-08 DIAGNOSIS — G479 Sleep disorder, unspecified: Secondary | ICD-10-CM | POA: Diagnosis not present

## 2018-11-08 DIAGNOSIS — M7542 Impingement syndrome of left shoulder: Secondary | ICD-10-CM | POA: Diagnosis not present

## 2018-11-08 DIAGNOSIS — Z800 Family history of malignant neoplasm of digestive organs: Secondary | ICD-10-CM | POA: Diagnosis not present

## 2018-11-08 DIAGNOSIS — E785 Hyperlipidemia, unspecified: Secondary | ICD-10-CM | POA: Insufficient documentation

## 2018-11-08 DIAGNOSIS — M75122 Complete rotator cuff tear or rupture of left shoulder, not specified as traumatic: Secondary | ICD-10-CM | POA: Diagnosis not present

## 2018-11-08 DIAGNOSIS — M7582 Other shoulder lesions, left shoulder: Secondary | ICD-10-CM | POA: Diagnosis not present

## 2018-11-08 HISTORY — PX: SHOULDER ARTHROSCOPY WITH SUBACROMIAL DECOMPRESSION AND BICEP TENDON REPAIR: SHX5689

## 2018-11-08 SURGERY — SHOULDER ARTHROSCOPY WITH SUBACROMIAL DECOMPRESSION AND BICEP TENDON REPAIR
Anesthesia: Regional | Site: Shoulder | Laterality: Left

## 2018-11-08 MED ORDER — FENTANYL CITRATE (PF) 100 MCG/2ML IJ SOLN
INTRAMUSCULAR | Status: DC | PRN
  Administered 2018-11-08: 100 ug via INTRAVENOUS

## 2018-11-08 MED ORDER — LIDOCAINE HCL (PF) 1 % IJ SOLN
INTRAMUSCULAR | Status: AC
  Filled 2018-11-08: qty 5

## 2018-11-08 MED ORDER — PROPOFOL 10 MG/ML IV BOLUS
INTRAVENOUS | Status: AC
  Filled 2018-11-08: qty 20

## 2018-11-08 MED ORDER — GLYCOPYRROLATE 0.2 MG/ML IJ SOLN
INTRAMUSCULAR | Status: DC | PRN
  Administered 2018-11-08: 0.2 mg via INTRAVENOUS

## 2018-11-08 MED ORDER — FAMOTIDINE 20 MG PO TABS
20.0000 mg | ORAL_TABLET | Freq: Once | ORAL | Status: AC
  Administered 2018-11-08: 20 mg via ORAL

## 2018-11-08 MED ORDER — ONDANSETRON HCL 4 MG/2ML IJ SOLN: 4.0000 mg | Freq: Four times a day (QID) | INTRAMUSCULAR | Status: DC | PRN

## 2018-11-08 MED ORDER — BUPIVACAINE LIPOSOME 1.3 % IJ SUSP
INTRAMUSCULAR | Status: AC
  Filled 2018-11-08: qty 20

## 2018-11-08 MED ORDER — EPINEPHRINE PF 1 MG/ML IJ SOLN
INTRAMUSCULAR | Status: AC
  Filled 2018-11-08: qty 1

## 2018-11-08 MED ORDER — SUGAMMADEX SODIUM 200 MG/2ML IV SOLN
INTRAVENOUS | Status: DC | PRN
  Administered 2018-11-08: 100 mg via INTRAVENOUS

## 2018-11-08 MED ORDER — BUPIVACAINE HCL (PF) 0.5 % IJ SOLN
INTRAMUSCULAR | Status: DC | PRN
  Administered 2018-11-08: 7 mL via PERINEURAL
  Administered 2018-11-08: 3 mL via PERINEURAL

## 2018-11-08 MED ORDER — METOCLOPRAMIDE HCL 10 MG PO TABS: 5.0000 mg | ORAL_TABLET | Freq: Three times a day (TID) | ORAL | Status: DC | PRN

## 2018-11-08 MED ORDER — PROPOFOL 10 MG/ML IV BOLUS
INTRAVENOUS | Status: DC | PRN
  Administered 2018-11-08: 80 mg via INTRAVENOUS

## 2018-11-08 MED ORDER — FENTANYL CITRATE (PF) 100 MCG/2ML IJ SOLN
50.0000 ug | Freq: Once | INTRAMUSCULAR | Status: AC
  Administered 2018-11-08: 50 ug via INTRAVENOUS

## 2018-11-08 MED ORDER — OXYCODONE HCL 5 MG PO TABS: 5.0000 mg | ORAL_TABLET | Freq: Once | ORAL | Status: DC | PRN

## 2018-11-08 MED ORDER — BUPIVACAINE-EPINEPHRINE (PF) 0.5% -1:200000 IJ SOLN
INTRAMUSCULAR | Status: AC
  Filled 2018-11-08: qty 90

## 2018-11-08 MED ORDER — BUPIVACAINE HCL (PF) 0.5 % IJ SOLN
INTRAMUSCULAR | Status: AC
  Filled 2018-11-08: qty 10

## 2018-11-08 MED ORDER — LACTATED RINGERS IV SOLN
INTRAVENOUS | Status: DC
  Administered 2018-11-08: 10:00:00 via INTRAVENOUS

## 2018-11-08 MED ORDER — EPINEPHRINE PF 1 MG/ML IJ SOLN
INTRAMUSCULAR | Status: DC | PRN
  Administered 2018-11-08: 1 mg

## 2018-11-08 MED ORDER — ACETAMINOPHEN 10 MG/ML IV SOLN
INTRAVENOUS | Status: AC
  Filled 2018-11-08: qty 100

## 2018-11-08 MED ORDER — CEFAZOLIN SODIUM-DEXTROSE 2-4 GM/100ML-% IV SOLN
INTRAVENOUS | Status: AC
  Filled 2018-11-08: qty 100

## 2018-11-08 MED ORDER — LIDOCAINE HCL (CARDIAC) PF 100 MG/5ML IV SOSY
PREFILLED_SYRINGE | INTRAVENOUS | Status: DC | PRN
  Administered 2018-11-08: 60 mg via INTRAVENOUS

## 2018-11-08 MED ORDER — OXYCODONE HCL 5 MG PO TABS: 5.0000 mg | ORAL_TABLET | ORAL | 0 refills | Status: DC | PRN

## 2018-11-08 MED ORDER — ONDANSETRON HCL 4 MG PO TABS: 4.0000 mg | ORAL_TABLET | Freq: Four times a day (QID) | ORAL | Status: DC | PRN

## 2018-11-08 MED ORDER — ACETAMINOPHEN 10 MG/ML IV SOLN
INTRAVENOUS | Status: DC | PRN
  Administered 2018-11-08: 1000 mg via INTRAVENOUS

## 2018-11-08 MED ORDER — PHENYLEPHRINE HCL 10 MG/ML IJ SOLN
INTRAMUSCULAR | Status: DC | PRN
  Administered 2018-11-08 (×3): 100 ug via INTRAVENOUS

## 2018-11-08 MED ORDER — DEXAMETHASONE SODIUM PHOSPHATE 10 MG/ML IJ SOLN
INTRAMUSCULAR | Status: DC | PRN
  Administered 2018-11-08: 5 mg via INTRAVENOUS

## 2018-11-08 MED ORDER — OXYCODONE HCL 5 MG PO TABS
5.0000 mg | ORAL_TABLET | ORAL | Status: DC | PRN
  Filled 2018-11-08: qty 2

## 2018-11-08 MED ORDER — GLYCOPYRROLATE 0.2 MG/ML IJ SOLN
INTRAMUSCULAR | Status: AC
  Filled 2018-11-08: qty 1

## 2018-11-08 MED ORDER — ROCURONIUM BROMIDE 100 MG/10ML IV SOLN
INTRAVENOUS | Status: DC | PRN
  Administered 2018-11-08: 35 mg via INTRAVENOUS

## 2018-11-08 MED ORDER — SODIUM CHLORIDE 0.9 % IV SOLN
INTRAVENOUS | Status: DC | PRN
  Administered 2018-11-08: 45 ug/min via INTRAVENOUS

## 2018-11-08 MED ORDER — FENTANYL CITRATE (PF) 100 MCG/2ML IJ SOLN: 25.0000 ug | INTRAMUSCULAR | Status: DC | PRN

## 2018-11-08 MED ORDER — FENTANYL CITRATE (PF) 100 MCG/2ML IJ SOLN
INTRAMUSCULAR | Status: AC
  Filled 2018-11-08: qty 2

## 2018-11-08 MED ORDER — MIDAZOLAM HCL 2 MG/2ML IJ SOLN
INTRAMUSCULAR | Status: AC
  Administered 2018-11-08: 1 mg via INTRAVENOUS
  Filled 2018-11-08: qty 2

## 2018-11-08 MED ORDER — FAMOTIDINE 20 MG PO TABS
ORAL_TABLET | ORAL | Status: AC
  Administered 2018-11-08: 20 mg via ORAL
  Filled 2018-11-08: qty 1

## 2018-11-08 MED ORDER — ONDANSETRON HCL 4 MG/2ML IJ SOLN
INTRAMUSCULAR | Status: DC | PRN
  Administered 2018-11-08: 4 mg via INTRAVENOUS

## 2018-11-08 MED ORDER — BUPIVACAINE-EPINEPHRINE (PF) 0.5% -1:200000 IJ SOLN
INTRAMUSCULAR | Status: DC | PRN
  Administered 2018-11-08: 30 mL via PERINEURAL

## 2018-11-08 MED ORDER — FENTANYL CITRATE (PF) 100 MCG/2ML IJ SOLN
INTRAMUSCULAR | Status: AC
  Administered 2018-11-08: 50 ug via INTRAVENOUS
  Filled 2018-11-08: qty 2

## 2018-11-08 MED ORDER — OXYCODONE HCL 5 MG/5ML PO SOLN: 5.0000 mg | Freq: Once | ORAL | Status: DC | PRN

## 2018-11-08 MED ORDER — MIDAZOLAM HCL 2 MG/2ML IJ SOLN
1.0000 mg | Freq: Once | INTRAMUSCULAR | Status: AC
  Administered 2018-11-08: 1 mg via INTRAVENOUS

## 2018-11-08 MED ORDER — METOCLOPRAMIDE HCL 5 MG/ML IJ SOLN: 5.0000 mg | Freq: Three times a day (TID) | INTRAMUSCULAR | Status: DC | PRN

## 2018-11-08 MED ORDER — BUPIVACAINE LIPOSOME 1.3 % IJ SUSP
INTRAMUSCULAR | Status: DC | PRN
  Administered 2018-11-08: 13 mL via PERINEURAL
  Administered 2018-11-08: 7 mL via PERINEURAL

## 2018-11-08 MED ORDER — POTASSIUM CHLORIDE IN NACL 20-0.9 MEQ/L-% IV SOLN: INTRAVENOUS | Status: DC

## 2018-11-08 SURGICAL SUPPLY — 48 items
ANCHOR BONE REGENETEN (Anchor) ×1 IMPLANT
ANCHOR JUGGERKNOT WTAP NDL 2.9 (Anchor) ×3 IMPLANT
ANCHOR SUT QUATTRO KNTLS 4.5 (Anchor) ×1 IMPLANT
ANCHOR TENDON REGENETEN (Staple) ×1 IMPLANT
BIT DRILL JUGRKNT W/NDL BIT2.9 (DRILL) IMPLANT
BLADE FULL RADIUS 3.5 (BLADE) ×2 IMPLANT
BUR ACROMIONIZER 4.0 (BURR) ×2 IMPLANT
CANNULA SHAVER 8MMX76MM (CANNULA) ×2 IMPLANT
CHLORAPREP W/TINT 26ML (MISCELLANEOUS) ×2 IMPLANT
COVER MAYO STAND STRL (DRAPES) IMPLANT
COVER WAND RF STERILE (DRAPES) ×1 IMPLANT
DRAPE IMP U-DRAPE 54X76 (DRAPES) ×4 IMPLANT
DRILL JUGGERKNOT W/NDL BIT 2.9 (DRILL) ×2
ELECT REM PT RETURN 9FT ADLT (ELECTROSURGICAL) ×2
ELECTRODE REM PT RTRN 9FT ADLT (ELECTROSURGICAL) ×1 IMPLANT
GAUZE PETRO XEROFOAM 1X8 (MISCELLANEOUS) ×2 IMPLANT
GAUZE SPONGE 4X4 12PLY STRL (GAUZE/BANDAGES/DRESSINGS) ×2 IMPLANT
GLOVE BIO SURGEON STRL SZ7.5 (GLOVE) ×4 IMPLANT
GLOVE BIO SURGEON STRL SZ8 (GLOVE) ×4 IMPLANT
GLOVE BIOGEL PI IND STRL 8 (GLOVE) ×1 IMPLANT
GLOVE BIOGEL PI INDICATOR 8 (GLOVE) ×1
GLOVE INDICATOR 8.0 STRL GRN (GLOVE) ×2 IMPLANT
GOWN STRL REUS W/ TWL LRG LVL3 (GOWN DISPOSABLE) ×1 IMPLANT
GOWN STRL REUS W/ TWL XL LVL3 (GOWN DISPOSABLE) ×1 IMPLANT
GOWN STRL REUS W/TWL LRG LVL3 (GOWN DISPOSABLE) ×1
GOWN STRL REUS W/TWL XL LVL3 (GOWN DISPOSABLE) ×1
GRASPER SUT 15 45D LOW PRO (SUTURE) IMPLANT
IMPL REGENETEN MEDIUM (Shoulder) IMPLANT
IMPLANT REGENETEN MEDIUM (Shoulder) ×2 IMPLANT
IV LACTATED RINGER IRRG 3000ML (IV SOLUTION) ×1
IV LR IRRIG 3000ML ARTHROMATIC (IV SOLUTION) ×1 IMPLANT
MANIFOLD NEPTUNE II (INSTRUMENTS) ×2 IMPLANT
MASK FACE SPIDER DISP (MASK) ×2 IMPLANT
MAT ABSORB  FLUID 56X50 GRAY (MISCELLANEOUS) ×1
MAT ABSORB FLUID 56X50 GRAY (MISCELLANEOUS) ×1 IMPLANT
NEEDLE HYPO 22GX1.5 SAFETY (NEEDLE) ×2 IMPLANT
PACK ARTHROSCOPY SHOULDER (MISCELLANEOUS) ×2 IMPLANT
SLING ARM LRG DEEP (SOFTGOODS) ×1 IMPLANT
SLING ULTRA II LG (MISCELLANEOUS) ×2 IMPLANT
STAPLER SKIN PROX 35W (STAPLE) ×2 IMPLANT
STRAP SAFETY 5IN WIDE (MISCELLANEOUS) ×2 IMPLANT
SUT ETHIBOND 0 MO6 C/R (SUTURE) ×2 IMPLANT
SUT VIC AB 2-0 CT1 27 (SUTURE) ×2
SUT VIC AB 2-0 CT1 TAPERPNT 27 (SUTURE) ×2 IMPLANT
TAPE MICROFOAM 4IN (TAPE) ×2 IMPLANT
TUBING ARTHRO INFLOW-ONLY STRL (TUBING) ×2 IMPLANT
TUBING CONNECTING 10 (TUBING) ×2 IMPLANT
WAND WEREWOLF FLOW 90D (MISCELLANEOUS) ×2 IMPLANT

## 2018-11-08 NOTE — Anesthesia Preprocedure Evaluation (Signed)
Anesthesia Evaluation  Patient identified by MRN, date of birth, ID band Patient awake    Reviewed: Allergy & Precautions, H&P , NPO status , Patient's Chart, lab work & pertinent test results  History of Anesthesia Complications Negative for: history of anesthetic complications  Airway Mallampati: III  TM Distance: >3 FB Neck ROM: limited    Dental  (+) Chipped   Pulmonary neg pulmonary ROS, neg shortness of breath, former smoker,           Cardiovascular Exercise Tolerance: Good (-) angina(-) Past MI and (-) DOE negative cardio ROS       Neuro/Psych negative neurological ROS  negative psych ROS   GI/Hepatic Neg liver ROS, GERD  Medicated and Controlled,  Endo/Other  negative endocrine ROS  Renal/GU      Musculoskeletal   Abdominal   Peds  Hematology negative hematology ROS (+)   Anesthesia Other Findings Past Medical History: No date: Allergic rhinitis due to pollen No date: Cancer ([place])     Comment:  bcc forehead s/p Mohs Dr. Roel Cluck  No date: Endometriosis No date: Environmental and seasonal allergies No date: GERD (gastroesophageal reflux disease) No date: Hyperlipidemia 11/12: Osteopenia     Comment:  T -1.8 in hip No date: Sleep disturbance No date: UTI (urinary tract infection) No date: Vaginal atrophy  Past Surgical History: No date: ABDOMINAL HYSTERECTOMY     Comment:  total  No date: APPENDECTOMY No date: CATARACT EXTRACTION W/ INTRAOCULAR LENS  IMPLANT, BILATERAL 01/22/2016: COLONOSCOPY WITH PROPOFOL; N/A     Comment:  Procedure: COLONOSCOPY WITH PROPOFOL;  Surgeon: Josefine Class, MD;  Location: [facility] END[facility];  Service:               Endoscopy;  Laterality: N/A; ~2001: ROTATOR CUFF REPAIR; Right  BMI    Body Mass Index:  22.40 kg/m      Reproductive/Obstetrics negative OB ROS                             Anesthesia  Physical Anesthesia Plan  ASA: III  Anesthesia Plan: General ETT   Post-op Pain Management: GA combined w/ Regional for post-op pain   Induction: Intravenous  PONV Risk Score and Plan: Ondansetron, Dexamethasone, Midazolam and Treatment may vary due to age or medical condition  Airway Management Planned: Oral ETT  Additional Equipment:   Intra-op Plan:   Post-operative Plan: Extubation in OR  Informed Consent: I have reviewed the patients History and Physical, chart, labs and discussed the procedure including the risks, benefits and alternatives for the proposed anesthesia with the patient or authorized representative who has indicated his/her understanding and acceptance.     Dental Advisory Given  Plan Discussed with: Anesthesiologist, CRNA and Surgeon  Anesthesia Plan Comments: (Patient consented for risks of anesthesia including but not limited to:  - adverse reactions to medications - damage to teeth, lips or other oral mucosa - sore throat or hoarseness - Damage to heart, brain, lungs or loss of life  Patient voiced understanding.)        Anesthesia Quick Evaluation

## 2018-11-08 NOTE — Anesthesia Postprocedure Evaluation (Signed)
Anesthesia Post Note  Patient: Annette Lee  Procedure(s) Performed: SHOULDER ARTHROSCOPY WITH DEBRIDEMENT, DECOMPRESSION AND ROTATOR CUFF REPAIR (Left Shoulder)  Patient location during evaluation: PACU Anesthesia Type: Regional Level of consciousness: awake and alert Pain management: pain level controlled Vital Signs Assessment: post-procedure vital signs reviewed and stable Respiratory status: spontaneous breathing, nonlabored ventilation, respiratory function stable and patient connected to nasal cannula oxygen Cardiovascular status: blood pressure returned to baseline and stable Postop Assessment: no apparent nausea or vomiting Anesthetic complications: no     Last Vitals:  Vitals:   11/08/18 1311 11/08/18 1332  BP: 136/68 (!) 144/70  Pulse: 78   Resp: 15 16  Temp: (!) 36.4 C (!) 36.3 C  SpO2: 100% 96%    Last Pain:  Vitals:   11/08/18 1332  TempSrc: Temporal  PainSc: 0-No pain                 Annette Lee

## 2018-11-08 NOTE — Op Note (Signed)
11/08/2018  12:33 PM  Patient:   Annette Lee  Pre-Op Diagnosis:   Nontraumatic full-thickness rotator cuff tear, left shoulder.   Post-Op Diagnosis:   Same with biceps tendinopathy, left shoulder.  Procedure:   Limited arthroscopic debridement, arthroscopic subacromial decompression, mini-open rotator cuff repair with a [employer] Regeneten patch, and mini-open biceps tenodesis, left shoulder.  Anesthesia:   General endotracheal with interscalene block using Exparel placed preoperatively by the anesthesiologist.  Surgeon:   Pascal Lux, MD  Assistant:   Cameron Proud, PA-C  Findings:   As above.  The anterior and mid-insertional fibers of the supraspinatus tendon had torn.  The tear measured approximately 1.5 cm anterior to posterior by 3 cm lateral to medial.  The remainder of the rotator cuff was in excellent condition.  There was mild labral fraying anteriorly and superiorly without frank detachment from the labrum.  The biceps tendon demonstrated moderate inflammation without partial or full-thickness tearing.  The articular surfaces of the glenoid and humerus both were in excellent condition.  Complications:   None  Fluids:   800 cc  Estimated blood loss:   5 cc  Tourniquet time:   None  Drains:   None  Closure:   Staples      Brief clinical note:   The patient is an 83 year old female with a history of left shoulder pain. The patient's symptoms have progressed despite medications, activity modification, etc. The patient's history and examination are consistent with impingement/tendinopathy with a rotator cuff tear. These findings were confirmed by MRI scan. The patient presents at this time for definitive management of these shoulder symptoms.  Procedure:   The patient underwent placement of an interscalene block using Exparel by the anesthesiologist in the preoperative holding area before being brought into the operating room and lain in the supine position. The  patient then underwent general endotracheal intubation and anesthesia before being repositioned in the beach chair position using the beach chair positioner. The left shoulder and upper extremity were prepped with ChloraPrep solution before being draped sterilely. Preoperative antibiotics were administered. A timeout was performed to confirm the proper surgical site before the expected portal sites and incision site were injected with 0.5% Sensorcaine with epinephrine. A posterior portal was created and the glenohumeral joint thoroughly inspected with the findings as described above. An anterior portal was created using an outside-in technique. The labrum and rotator cuff were further probed, again confirming the above-noted findings. The areas of labral fraying were debrided back to stable margins as were areas of synovitis. The ArthroCare wand was inserted and used to release the biceps tendon from its labral anchor. It also was used to obtain hemostasis as well as to "anneal" the labrum superiorly and anteriorly. The instruments were removed from the joint after suctioning the excess fluid.  The camera was repositioned through the posterior portal into the subacromial space. A separate lateral portal was created using an outside-in technique. The 3.5 mm full-radius resector was introduced and used to perform a subtotal bursectomy. The ArthroCare wand was then inserted and used to remove the periosteal tissue off the undersurface of the anterior third of the acromion as well as to recess the coracoacromial ligament from its attachment along the anterior and lateral margins of the acromion. The 4.0 mm acromionizing bur was introduced and used to complete the decompression by removing the undersurface of the anterior third of the acromion. The full radius resector was reintroduced to remove any residual bony debris before the  ArthroCare wand was reintroduced to obtain hemostasis. The instruments were then removed  from the subacromial space after suctioning the excess fluid.  An approximately 4-5 cm incision was made over the anterolateral aspect of the shoulder beginning at the anterolateral corner of the acromion and extending distally in line with the bicipital groove. This incision was carried down through the subcutaneous tissues to expose the deltoid fascia. The raphae between the anterior and middle thirds was identified and this plane developed to provide access into the subacromial space. Additional bursal tissues were debrided sharply using Metzenbaum scissors. The rotator cuff tear was readily identified. The margins were debrided sharply with a #15 blade and the exposed greater tuberosity roughened with a rongeur. The tear was repaired using two Biomet 2.9 mm JuggerKnot anchors. These sutures were then brought back laterally and secured using one [place] QuatroLink anchors to create a two-layer closure. In addition, several #0 Ethibond interrupted sutures were placed in a side to side fashion to repair the longitudinal component of the tear. Given the patient's age and tissue quality, it was felt best to augment this repair with a Butler patch. The medium patch was applied using the appropriate soft tissue and bone staples. An apparent watertight closure was obtained.  The bicipital groove was identified by palpation and opened for 1-1.5 cm. The biceps tendon stump was retrieved through this defect. The floor of the bicipital groove was roughened with a curet before another Biomet 2.9 mm JuggerKnot anchor was inserted. Both sets of sutures were passed through the biceps tendon and tied securely to effect the tenodesis. The bicipital sheath was reapproximated using two #0 Ethibond interrupted sutures, incorporating the biceps tendon to further reinforce the tenodesis.  The wound was copiously irrigated with sterile saline solution before the deltoid raphae was reapproximated using 2-0 Vicryl  interrupted sutures. The subcutaneous tissues were closed in two layers using 2-0 Vicryl interrupted sutures before the skin was closed using staples. The portal sites also were closed using staples. A sterile bulky dressing was applied to the shoulder before the arm was placed into a shoulder immobilizer. The patient was then awakened, extubated, and returned to the recovery room in satisfactory condition after tolerating the procedure well.

## 2018-11-08 NOTE — Discharge Instructions (Addendum)
Interscalene Nerve Block (ISNB) Discharge Instructions   1.  For your surgery you have received an Interscalene Nerve Block. 2. Nerve Blocks affect many types of nerves, including nerves that control movement, pain and normal sensation.  You may experience feelings such as numbness, tingling, heaviness, weakness or the inability to move your arm or the feeling or sensation that your arm has "fallen asleep". 3. A nerve block can last for 2 - 36 hours or more depending on the medication used.  Usually the weakness wears off first.  The tingling and heaviness usually wear off next.  Finally you may start to notice pain.  Keep in mind that this may occur in any order.  once a nerve block starts to wear off it is usually completely gone within 60 minutes. 4. ISNB may cause mild shortness of breath, a hoarse voice, blurry vision, unequal pupils, or drooping of the face on the same side as the nerve block.  These symptoms will usually go away within 12 hours.  Very rarely the procedure itself can cause mild seizures. 5. If needed, your surgeon will give you a prescription for pain medication.  It will take about 60 minutes for the oral pain medication to become fully effective.  So, it is recommended that you start taking this medication before the nerve block first begins to wear off, or when you first begin to feel discomfort. 6. Keep in mind that nerve blocks often wear off in the middle of the night.   If you are going to bed and the block has not started to wear off or you have not started to have any discomfort, consider setting an alarm for 2 to 3 hours, so you can assess your block.  If you notice the block is wearing off or you are starting to have discomfort, you can take your pain medication. 7. Take your pain medication only as prescribed.  Pain medication can cause sedation and decrease your breathing if you take more than you need for the level of pain that you have. 8. Nausea is a common side effect  of many pain medications.  You may want to eat something before taking your pain medicine to prevent nausea. 9. After an Interscalene nerve block, you cannot feel pain, pressure or extremes in temperature in the effected arm.  Because your arm is numb it is at an increased risk for injury.  To decrease the possibility of injury, please practice the following:  a. While you are awake change the position of your arm frequently to prevent too much pressure on any one area for prolonged periods of time. b.  If you have a cast or tight dressing, check the color or your fingers every couple of hours.  Call your surgeon with the appearance of any discoloration (white or blue). c. If you are given a sling to wear before you go home, please wear it  at all times until the block has completely worn off.  Do not get up at night without your sling. d. If you experience any problems or concerns, please contact your surgeon's office. If you experience severe or prolonged shortness of breath go to the nearest emergency department.Orthopedic discharge instructions: Keep dressing dry and intact.  May shower after dressing changed on post-op day #4 (Monday).  Cover staples with Band-Aids after drying off. Apply ice frequently to shoulder. Take ibuprofen 600 mg TID with meals for 7-10 days, then as necessary. Take oxycodone as prescribed when needed.  May supplement with ES Tylenol if necessary. Keep shoulder immobilizer on at all times except may remove for bathing purposes. Follow-up in 10-14 days or as scheduled.  AMBULATORY SURGERY  DISCHARGE INSTRUCTIONS   1) The drugs that you were given will stay in your system until tomorrow so for the next 24 hours you should not:  A) Drive an automobile B) Make any legal decisions C) Drink any alcoholic beverage   2) You may resume regular meals tomorrow.  Today it is better to start with liquids and gradually work up to solid foods.  You may eat anything you  prefer, but it is better to start with liquids, then soup and crackers, and gradually work up to solid foods.   3) Please notify your doctor immediately if you have any unusual bleeding, trouble breathing, redness and pain at the surgery site, drainage, fever, or pain not relieved by medication.    4) Additional Instructions:        Please contact your physician with any problems or Same Day Surgery at [phone], Monday through Friday 6 am to 4 pm, or [place] [facility] number at [phone].

## 2018-11-08 NOTE — Anesthesia Post-op Follow-up Note (Signed)
Anesthesia QCDR form completed.        

## 2018-11-08 NOTE — H&P (Signed)
Paper H&P to be scanned into permanent record. H&P reviewed and patient re-examined. No changes. 

## 2018-11-08 NOTE — Transfer of Care (Signed)
Immediate Anesthesia Transfer of Care Note  Patient: [employer]  Procedure(s) Performed: SHOULDER ARTHROSCOPY WITH DEBRIDEMENT, DECOMPRESSION AND ROTATOR CUFF REPAIR (Left Shoulder)  Patient Location: PACU  Anesthesia Type:General  Level of Consciousness: awake  Airway & Oxygen Therapy: Patient Spontanous Breathing  Post-op Assessment: Report given to RN  Post vital signs: stable  Last Vitals:  Vitals Value Taken Time  BP    Temp    Pulse    Resp    SpO2      Last Pain:  Vitals:   11/08/18 0928  TempSrc:   PainSc: 0-No pain         Complications: No apparent anesthesia complications

## 2018-11-08 NOTE — Anesthesia Procedure Notes (Signed)
Anesthesia Regional Block: Interscalene brachial plexus block   Pre-Anesthetic Checklist: ,, timeout performed, Correct Patient, Correct Site, Correct Laterality, Correct Procedure, Correct Position, site marked, Risks and benefits discussed,  Surgical consent,  Pre-op evaluation,  At surgeon's request and post-op pain management  Laterality: Upper and Left  Prep: chloraprep       Needles:  Injection technique: Single-shot  Needle Type: Stimiplex     Needle Length: 5cm  Needle Gauge: 22     Additional Needles:   Procedures:,,,, ultrasound used (permanent image in chart),,,,  Narrative:  Start time: 11/08/2018 9:32 AM End time: 11/08/2018 9:38 AM Injection made incrementally with aspirations every 5 mL.  Performed by: Personally  Anesthesiologist: Piscitello, Precious Haws, MD  Additional Notes: Patient consented for risk and benefits of nerve block including but not limited to nerve damage, failed block, bleeding and infection.  Patient voiced understanding.  Functioning IV was confirmed and monitors were applied.  A 77mm 22ga Stimuplex needle was used. Sterile prep,hand hygiene and sterile gloves were used.  Minimal sedation used for procedure.  No paresthesia endorsed by patient during the procedure.  Negative aspiration and negative test dose prior to incremental administration of local anesthetic. The patient tolerated the procedure well with no immediate complications.

## 2018-11-09 ENCOUNTER — Encounter: Payer: Self-pay | Admitting: Surgery

## 2018-11-12 NOTE — Addendum Note (Signed)
Addendum  created 11/12/18 0222 by Doreen Salvage, CRNA   Charge Capture section accepted

## 2018-11-14 DIAGNOSIS — M25612 Stiffness of left shoulder, not elsewhere classified: Secondary | ICD-10-CM | POA: Diagnosis not present

## 2018-11-14 DIAGNOSIS — M6281 Muscle weakness (generalized): Secondary | ICD-10-CM | POA: Diagnosis not present

## 2018-11-14 DIAGNOSIS — M25512 Pain in left shoulder: Secondary | ICD-10-CM | POA: Diagnosis not present

## 2018-11-19 DIAGNOSIS — M6281 Muscle weakness (generalized): Secondary | ICD-10-CM | POA: Diagnosis not present

## 2018-11-23 DIAGNOSIS — M6281 Muscle weakness (generalized): Secondary | ICD-10-CM | POA: Diagnosis not present

## 2018-11-26 DIAGNOSIS — M6281 Muscle weakness (generalized): Secondary | ICD-10-CM | POA: Diagnosis not present

## 2018-11-27 ENCOUNTER — Other Ambulatory Visit: Payer: Self-pay | Admitting: Internal Medicine

## 2018-11-27 DIAGNOSIS — E785 Hyperlipidemia, unspecified: Secondary | ICD-10-CM

## 2018-11-27 MED ORDER — SIMVASTATIN 10 MG PO TABS: 10.0000 mg | ORAL_TABLET | ORAL | 11 refills | Status: DC

## 2018-11-28 DIAGNOSIS — M6281 Muscle weakness (generalized): Secondary | ICD-10-CM | POA: Diagnosis not present

## 2018-11-30 DIAGNOSIS — M6281 Muscle weakness (generalized): Secondary | ICD-10-CM | POA: Diagnosis not present

## 2018-12-03 DIAGNOSIS — M6281 Muscle weakness (generalized): Secondary | ICD-10-CM | POA: Diagnosis not present

## 2018-12-05 DIAGNOSIS — M6281 Muscle weakness (generalized): Secondary | ICD-10-CM | POA: Diagnosis not present

## 2018-12-07 DIAGNOSIS — M6281 Muscle weakness (generalized): Secondary | ICD-10-CM | POA: Diagnosis not present

## 2018-12-10 DIAGNOSIS — M6281 Muscle weakness (generalized): Secondary | ICD-10-CM | POA: Diagnosis not present

## 2018-12-12 DIAGNOSIS — M6281 Muscle weakness (generalized): Secondary | ICD-10-CM | POA: Diagnosis not present

## 2018-12-14 DIAGNOSIS — M6281 Muscle weakness (generalized): Secondary | ICD-10-CM | POA: Diagnosis not present

## 2018-12-17 ENCOUNTER — Ambulatory Visit
Admission: RE | Admit: 2018-12-17 | Discharge: 2018-12-17 | Disposition: A | Discharge status: Home / Self Care | Payer: Medicare Other | Source: Ambulatory Visit | Attending: Internal Medicine | Admitting: Internal Medicine

## 2018-12-17 DIAGNOSIS — M8580 Other specified disorders of bone density and structure, unspecified site: Secondary | ICD-10-CM | POA: Insufficient documentation

## 2018-12-17 DIAGNOSIS — E2839 Other primary ovarian failure: Secondary | ICD-10-CM | POA: Diagnosis not present

## 2018-12-17 DIAGNOSIS — M810 Age-related osteoporosis without current pathological fracture: Secondary | ICD-10-CM | POA: Diagnosis not present

## 2018-12-17 DIAGNOSIS — Z1231 Encounter for screening mammogram for malignant neoplasm of breast: Secondary | ICD-10-CM | POA: Insufficient documentation

## 2018-12-17 DIAGNOSIS — M6281 Muscle weakness (generalized): Secondary | ICD-10-CM | POA: Diagnosis not present

## 2018-12-19 DIAGNOSIS — M6281 Muscle weakness (generalized): Secondary | ICD-10-CM | POA: Diagnosis not present

## 2018-12-21 DIAGNOSIS — M6281 Muscle weakness (generalized): Secondary | ICD-10-CM | POA: Diagnosis not present

## 2018-12-24 DIAGNOSIS — M6281 Muscle weakness (generalized): Secondary | ICD-10-CM | POA: Diagnosis not present

## 2018-12-24 DIAGNOSIS — M7582 Other shoulder lesions, left shoulder: Secondary | ICD-10-CM | POA: Diagnosis not present

## 2018-12-26 DIAGNOSIS — M7582 Other shoulder lesions, left shoulder: Secondary | ICD-10-CM | POA: Diagnosis not present

## 2018-12-26 DIAGNOSIS — M6281 Muscle weakness (generalized): Secondary | ICD-10-CM | POA: Diagnosis not present

## 2018-12-28 DIAGNOSIS — M6281 Muscle weakness (generalized): Secondary | ICD-10-CM | POA: Diagnosis not present

## 2018-12-28 DIAGNOSIS — M7582 Other shoulder lesions, left shoulder: Secondary | ICD-10-CM | POA: Diagnosis not present

## 2018-12-31 DIAGNOSIS — M7582 Other shoulder lesions, left shoulder: Secondary | ICD-10-CM | POA: Diagnosis not present

## 2018-12-31 DIAGNOSIS — M6281 Muscle weakness (generalized): Secondary | ICD-10-CM | POA: Diagnosis not present

## 2019-01-02 DIAGNOSIS — M6281 Muscle weakness (generalized): Secondary | ICD-10-CM | POA: Diagnosis not present

## 2019-01-02 DIAGNOSIS — M7582 Other shoulder lesions, left shoulder: Secondary | ICD-10-CM | POA: Diagnosis not present

## 2019-01-04 DIAGNOSIS — M6281 Muscle weakness (generalized): Secondary | ICD-10-CM | POA: Diagnosis not present

## 2019-01-04 DIAGNOSIS — M7582 Other shoulder lesions, left shoulder: Secondary | ICD-10-CM | POA: Diagnosis not present

## 2019-01-07 DIAGNOSIS — M7582 Other shoulder lesions, left shoulder: Secondary | ICD-10-CM | POA: Diagnosis not present

## 2019-01-07 DIAGNOSIS — M6281 Muscle weakness (generalized): Secondary | ICD-10-CM | POA: Diagnosis not present

## 2019-01-08 ENCOUNTER — Encounter: Payer: Self-pay | Admitting: Internal Medicine

## 2019-01-08 ENCOUNTER — Ambulatory Visit (INDEPENDENT_AMBULATORY_CARE_PROVIDER_SITE_OTHER): Payer: Medicare Other | Admitting: Internal Medicine

## 2019-01-08 ENCOUNTER — Other Ambulatory Visit: Payer: Self-pay

## 2019-01-08 VITALS — BP 130/58 | HR 80 | Temp 98.3°F | Ht 64.0 in | Wt 131.4 lb

## 2019-01-08 DIAGNOSIS — L03032 Cellulitis of left toe: Secondary | ICD-10-CM | POA: Diagnosis not present

## 2019-01-08 DIAGNOSIS — C44311 Basal cell carcinoma of skin of nose: Secondary | ICD-10-CM | POA: Diagnosis not present

## 2019-01-08 DIAGNOSIS — Z85828 Personal history of other malignant neoplasm of skin: Secondary | ICD-10-CM | POA: Diagnosis not present

## 2019-01-08 DIAGNOSIS — M79675 Pain in left toe(s): Secondary | ICD-10-CM

## 2019-01-08 DIAGNOSIS — Z9889 Other specified postprocedural states: Secondary | ICD-10-CM | POA: Diagnosis not present

## 2019-01-08 DIAGNOSIS — J309 Allergic rhinitis, unspecified: Secondary | ICD-10-CM | POA: Insufficient documentation

## 2019-01-08 DIAGNOSIS — D485 Neoplasm of uncertain behavior of skin: Secondary | ICD-10-CM | POA: Diagnosis not present

## 2019-01-08 DIAGNOSIS — C4490 Unspecified malignant neoplasm of skin, unspecified: Secondary | ICD-10-CM

## 2019-01-08 HISTORY — DX: Other specified postprocedural states: Z98.890

## 2019-01-08 MED ORDER — MUPIROCIN 2 % EX OINT: 1.0000 "application " | TOPICAL_OINTMENT | Freq: Three times a day (TID) | CUTANEOUS | 1 refills | Status: DC

## 2019-01-08 MED ORDER — MONTELUKAST SODIUM 10 MG PO TABS: 10.0000 mg | ORAL_TABLET | Freq: Every day | ORAL | 0 refills | Status: DC

## 2019-01-08 NOTE — Progress Notes (Addendum)
Chief Complaint  Patient presents with  . Toe Pain    Left great toe   F/u  1. C/o left lateral toe soreness mild x 2 weeks and c/w gout b/c she looked up on internet also c/o recently cutting her nails too short as well and wearing too short shoes  2. S/p left shoulder surgery doing PT 3x per week at [facility]  3. Left nose bx today ?NMSC may need mohs like had on her forehead    Review of Systems  Constitutional: Negative for weight loss.  HENT: Negative for hearing loss.   Eyes: Negative for blurred vision and redness.  Respiratory: Negative for shortness of breath.   Cardiovascular: Negative for chest pain.  Gastrointestinal: Negative for abdominal pain.  Musculoskeletal:       +left great toe pain    Skin: Negative for rash.  Neurological: Negative for headaches.   Past Medical History:  Diagnosis Date  . Allergic rhinitis due to pollen   . 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 Endometriosis   . Environmental and seasonal allergies   . GERD (gastroesophageal reflux disease)   . Hyperlipidemia   . Osteopenia 11/12   T -1.8 in hip  . Sleep disturbance   . UTI (urinary tract infection)   . Vaginal atrophy    Past Surgical History:  Procedure Laterality Date  . ABDOMINAL HYSTERECTOMY     total   . APPENDECTOMY    . CATARACT EXTRACTION W/ INTRAOCULAR LENS  IMPLANT, BILATERAL    . COLONOSCOPY WITH PROPOFOL N/A 01/22/2016   Procedure: COLONOSCOPY WITH PROPOFOL;  Surgeon: Josefine Class, MD;  Location: [facility] ENDOSCOPY;  Service: Endoscopy;  Laterality: N/A;  . ROTATOR CUFF REPAIR Right ~2001  . SHOULDER ARTHROSCOPY WITH SUBACROMIAL DECOMPRESSION AND BICEP TENDON REPAIR Left 11/08/2018   Procedure: SHOULDER ARTHROSCOPY WITH DEBRIDEMENT, DECOMPRESSION AND ROTATOR CUFF REPAIR;  Surgeon: Corky Mull, MD;  Location: [facility] ORS;  Service: Orthopedics;  Laterality: Left;   Family History  Problem Relation Age of Onset  . Cancer Mother    CLL  . Cancer Father        colon  . Aneurysm Father   . Cancer Brother        colon cancer  . Heart disease Maternal Uncle   . Diabetes Neg Hx   . Breast cancer Neg Hx   . Bladder Cancer Neg Hx   . Kidney cancer Neg Hx    Social History   Socioeconomic History  . Marital status: Married    Spouse name: Not on file  . Number of children: 0  . Years of education: Not on file  . Highest education level: Not on file  Occupational History  . 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 Financial resource strain: Not hard at all  . Food insecurity:    Worry: Never true    Inability: Never true  . Transportation needs:    Medical: No    Non-medical: Not on file  Tobacco Use  . Smoking status: Former Smoker    Last attempt to quit: 10/30/1966    Years since quitting: 52.2  . Smokeless tobacco: Never Used  . Tobacco comment: as of 08/2017 quit smoking >15 years ago 1ppd x 35 years no FH lung cancer   Substance and Sexual Activity  . Alcohol use: Yes    Alcohol/week: 3.0 standard drinks  Types: 3 Shots of liquor per week    Comment: drinks gin  . Drug use: No  . Sexual activity: Never  Lifestyle  . Physical activity:    Days per week: 4 days    Minutes per session: 60 min  . Stress: Not at all  Relationships  . Social connections:    Talks on phone: Not on file    Gets together: Not on file    Attends religious service: Not on file    Active member of club or organization: Not on file    Attends meetings of clubs or organizations: Not on file    Relationship status: Not on file  . Intimate partner violence:    Fear of current or ex partner: No    Emotionally abused: No    Physically abused: No    Forced sexual activity: No  Other Topics Concern  . Not on file  Social History Narrative   Has living will   Husband is health care POA.   Would accept resuscitation but no prolonged ventilation   Not sure about tube feeds    Moved her from [place]    Exercises 3x per week    No kids    No outpatient medications have been marked as taking for the 01/08/19 encounter (Office Visit) with McLean-Scocuzza, Nino Glow, MD.   Allergies  Allergen Reactions  . Contrast Media [Iodinated Diagnostic Agents] Anaphylaxis   Recent Results (from the past 2160 hour(s))  CBC     Status: None   Collection Time: 10/30/18  9:40 AM  Result Value Ref Range   WBC 6.4 4.0 - 10.5 K/uL   RBC 4.58 3.87 - 5.11 MIL/uL   Hemoglobin 13.8 12.0 - 15.0 g/dL   HCT 42.8 36.0 - 46.0 %   MCV 93.4 80.0 - 100.0 fL   MCH 30.1 26.0 - 34.0 pg   MCHC 32.2 30.0 - 36.0 g/dL   RDW 12.6 11.5 - 15.5 %   Platelets 180 150 - 400 K/uL   nRBC 0.0 0.0 - 0.2 %    Comment: Performed at [facility], [address] [place], [place] [address]  Basic metabolic panel     Status: None   Collection Time: 10/30/18  9:40 AM  Result Value Ref Range   Sodium 140 135 - 145 mmol/L   Potassium 3.5 3.5 - 5.1 mmol/L   Chloride 107 98 - 111 mmol/L   CO2 27 22 - 32 mmol/L   Glucose, Bld 80 70 - 99 mg/dL   BUN 18 8 - 23 mg/dL   Creatinine, Ser 0.68 0.44 - 1.00 mg/dL   Calcium 9.3 8.9 - 10.3 mg/dL   GFR calc non Af Amer >60 >60 mL/min   GFR calc Af Amer >60 >60 mL/min   Anion gap 6 5 - 15    Comment: Performed at [facility], [address] [place], [place] [address]   Objective  Body mass index is 22.55 kg/m. Wt Readings from Last 3 Encounters:  01/08/19 131 lb 6.4 oz (59.6 kg)  11/08/18 130 lb 8.2 oz (59.2 kg)  11/02/18 130 lb 9.6 oz (59.2 kg)   Temp Readings from Last 3 Encounters:  01/08/19 98.3 F (36.8 C) (Oral)  11/08/18 (!) 97.4 F (36.3 C) (Temporal)  11/02/18 (!) 97.5 F (36.4 C) (Oral)   BP Readings from Last 3 Encounters:  01/08/19 (!) 130/58  11/08/18 (!) 144/70  11/02/18 128/62   Pulse Readings from Last 3  Encounters:  01/08/19 80  11/08/18 78  11/02/18 71    Physical Exam Vitals signs and nursing note  reviewed.  Constitutional:      Appearance: Normal appearance. She is well-developed and well-groomed.  HENT:     Head: Normocephalic and atraumatic.     Nose: Nose normal.     Mouth/Throat:     Mouth: Mucous membranes are moist.     Pharynx: Oropharynx is clear.  Eyes:     Conjunctiva/sclera: Conjunctivae normal.     Pupils: Pupils are equal, round, and reactive to light.  Cardiovascular:     Rate and Rhythm: Normal rate and regular rhythm.     Heart sounds: Normal heart sounds. No murmur.  Pulmonary:     Effort: Pulmonary effort is normal.     Breath sounds: Normal breath sounds.  Skin:    General: Skin is warm and dry.       Neurological:     General: No focal deficit present.     Mental Status: She is alert and oriented to person, place, and time. Mental status is at baseline.     Gait: Gait normal.  Psychiatric:        Attention and Perception: Attention and perception normal.        Mood and Affect: Mood and affect normal.        Speech: Speech normal.        Behavior: Behavior normal. Behavior is cooperative.        Thought Content: Thought content normal.        Cognition and Memory: Cognition and memory normal.        Judgment: Judgment normal.     Assessment   1. Paronychia left great toe  2. S/p left shoulder surgery  3. 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 HM 5. Allergic rhinitis   Plan   1. Warm soaks  bactroban 2. In PT 3x per week  3. Pending possible mohs  4.  Flu shad 07/16/18  Hadpna vaccines x 2  Had zostervaxgiven Rx shingrixhad not had Tdaphad 12/25/17 pna 12 due 08/2018  Out of pap window s/p total hysterectomyjust saw OB/GYN 12/2017 mammo neg 12/17/18 negative  dexa 12/17/18 osteoporosis consider prolia   Colonoscopy: 01/22/16 Dr. Rayann Heman h/o colon polpys. colonoscopy was difficult per report and she didn't like the MD who did the procedure. Reviewed +IH and adhesions making exam difficult.   cologuard done 02/03/16 repeat due 02/03/2019 until  age 69 reviewed with pt ok with this plan will re ordered closer to date  -sent cologuard today   Saw dermatology on [facility] 06/16/17 had AK LN2 f/u in 1 year, recently f/u 12/2017 mohs to forehead and bx today with brenda left nose ? If will need Mohs  03/26/2019 dermatology [facility]ie Dad Dr. Natale Lay [facility] nasal tip tx'ed Mohs,   She is former smoker quit >15 years ago smoked 1 ppd x 35 yeas no FH lung cancer  -consider CT low dose if insurance will cover in future   Cont exercise 3x per week 5. Add singulair to otc antihistamine to see if helps   Dr. Roland Rack saw 06/10/19 for rotator cuff surgery left f/u in 2 months some mild discomfort  Provider: Dr. Olivia Mackie McLean-Scocuzza-Internal Medicine

## 2019-01-08 NOTE — Progress Notes (Signed)
Pre visit review using our clinic review tool, if applicable. No additional management support is needed unless otherwise documented below in the visit note. 

## 2019-01-08 NOTE — Patient Instructions (Addendum)
Arthritis=Degenerative changes lower thoracic and lumbar spine most notable L4-5 level. -Ask at physical therapy to give you some back stretches to do   Tylenol as needed for pain 500 mg no more than 6 pills in 1 day or 3000 mg total for the day  Tumeric with Cumin capsules can help with inflammation  Glucosamine/chondroitin can help with arthritis   Montelukast oral tablets What is this medicine? MONTELUKAST (mon te LOO kast) is used to prevent and treat the symptoms of asthma. It is also used to treat allergies. Do not use for an acute asthma attack. This medicine may be used for other purposes; ask your health care provider or pharmacist if you have questions. COMMON BRAND NAME(S): Singulair What should I tell my health care provider before I take this medicine? They need to know if you have any of these conditions: -liver disease -an unusual or allergic reaction to montelukast, other medicines, foods, dyes, or preservatives -pregnant or trying to get pregnant -breast-feeding How should I use this medicine? This medicine should be given by mouth. Follow the directions on the prescription label. Take this medicine at the same time every day. You may take this medicine with or without meals. Do not chew the tablets. Do not stop taking your medicine unless your doctor tells you to. Talk to your pediatrician regarding the use of this medicine in children. Special care may be needed. While this drug may be prescribed for children as young as 37 years of age for selected conditions, precautions do apply. Overdosage: If you think you have taken too much of this medicine contact a poison control center or emergency room at once. NOTE: This medicine is only for you. Do not share this medicine with others. What if I miss a dose? If you miss a dose, take it as soon as you can. If it is almost time for your next dose, take only that dose. Do not take double or extra doses. What may interact with  this medicine? -anti-infectives like rifampin and rifabutin -medicines for seizures like phenytoin, phenobarbital, and carbamazepine This list may not describe all possible interactions. Give your health care provider a list of all the medicines, herbs, non-prescription drugs, or dietary supplements you use. Also tell them if you smoke, drink alcohol, or use illegal drugs. Some items may interact with your medicine. What should I watch for while using this medicine? Visit your doctor or health care professional for regular checks on your progress. Tell your doctor or health care professional if your allergy or asthma symptoms do not improve. Take your medicine even when you do not have symptoms. Do not stop taking any of your medicine(s) unless your doctor tells you to. If you have asthma, talk to your doctor about what to do in an acute asthma attack. Always have your inhaled rescue medicine for asthma attacks with you. Patients and their families should watch for new or worsening thoughts of suicide or depression. Also watch for sudden changes in feelings such as feeling anxious, agitated, panicky, irritable, hostile, aggressive, impulsive, severely restless, overly excited and hyperactive, or not being able to sleep. Any worsening of mood or thoughts of suicide or dying should be reported to your health care professional right away. What side effects may I notice from receiving this medicine? Side effects that you should report to your doctor or health care professional as soon as possible: -allergic reactions like skin rash or hives, or swelling of the face, lips, or tongue -breathing  problems -changes in emotions or moods -confusion -depressed mood -fever or infection -hallucinations -joint pain -painful lumps under the skin -pain, tingling, numbness in the hands or feet -redness, blistering, peeling, or loosening of the skin, including inside the mouth -restlessness -seizures -sleep  walking -signs and symptoms of infection like fever; chills; cough; sore throat; flu-like illness -signs and symptoms of liver injury like dark yellow or brown urine; general ill feeling or flu-like symptoms; light-colored stools; loss of appetite; nausea; right upper belly pain; unusually weak or tired; yellowing of the eyes or skin -sinus pain or swelling -stuttering -suicidal thoughts or other mood changes -tremors -trouble sleeping -uncontrolled muscle movements -unusual bleeding or bruising -vivid or bad dreams Side effects that usually do not require medical attention (report to your doctor or health care professional if they continue or are bothersome): -dizziness -drowsiness -headache -runny nose -stomach upset -tiredness This list may not describe all possible side effects. Call your doctor for medical advice about side effects. You may report side effects to FDA at 1-800-FDA-1088. Where should I keep my medicine? Keep out of the reach of children. Store at room temperature between 15 and 30 degrees C (59 and 86 degrees F). Protect from light and moisture. Keep this medicine in the original bottle. Throw away any unused medicine after the expiration date. NOTE: This sheet is a summary. It may not cover all possible information. If you have questions about this medicine, talk to your doctor, pharmacist, or health care provider.  2019 Elsevier/Gold Standard (2018-06-05 13:51:04)    Paronychia Paronychia is an infection of the skin that surrounds a nail. It usually affects the skin around a fingernail, but it may also occur near a toenail. It often causes pain and swelling around the nail. In some cases, a collection of pus (abscess) can form near or under the nail.  This condition may develop suddenly, or it may develop gradually over a longer period. In most cases, paronychia is not serious, and it will clear up with treatment. What are the causes? This condition may be caused  by bacteria or a fungus. These germs can enter the body through an opening in the skin, such as a cut or a hangnail. What increases the risk? This condition is more likely to develop in people who:  Get their hands wet often, such as those who work as Designer, industrial/product, bartenders, or nurses.  Bite their fingernails or suck their thumbs.  Trim their nails very short.  Have hangnails or injured fingertips.  Get manicures.  Have diabetes. What are the signs or symptoms? Symptoms of this condition include:  Redness and swelling of the skin near the nail.  Tenderness around the nail when you touch the area.  Pus-filled bumps under the skin at the base and sides of the nail (cuticle).  Fluid or pus under the nail.  Throbbing pain in the area. How is this diagnosed? This condition is diagnosed with a physical exam. In some cases, a sample of pus may be tested to determine what type of bacteria or fungus is causing the condition. How is this treated? Treatment depends on the cause and severity of your condition. If your condition is mild, it may clear up on its own in a few days or after soaking in warm water. If needed, treatment may include:  Antibiotic medicine, if your infection is caused by bacteria.  Antifungal medicine, if your infection is caused by a fungus.  A procedure to drain pus from an  abscess.  Anti-inflammatory medicine (corticosteroids). Follow these instructions at home: Wound care  Keep the affected area clean.  Soak the affected area in warm water, if told to do so by your health care provider. You may be told to do this for 20 minutes, 2-3 times a day.  Keep the area dry when you are not soaking it.  Do not try to drain an abscess yourself.  Follow instructions from your health care provider about how to take care of the affected area. Make sure you: ? Wash your hands with soap and water before you change your bandage (dressing). If soap and water are not  available, use hand sanitizer. ? Change your dressing as told by your health care provider.  If you had an abscess drained, check the area every day for signs of infection. Check for: ? Redness, swelling, or pain. ? Fluid or blood. ? Warmth. ? Pus or a bad smell. Medicines   Take over-the-counter and prescription medicines only as told by your health care provider.  If you were prescribed an antibiotic medicine, take it as told by your health care provider. Do not stop taking the antibiotic even if you start to feel better. General instructions  Avoid contact with harsh chemicals.  Do not pick at the affected area. Prevention  To prevent this condition from happening again: ? Wear rubber gloves when washing dishes or doing other tasks that require your hands to get wet. ? Wear gloves if your hands might come in contact with cleaners or other chemicals. ? Avoid injuring your nails or fingertips. ? Do not bite your nails or tear hangnails. ? Do not cut your nails very short. ? Do not cut your cuticles. ? Use clean nail clippers or scissors when trimming nails. Contact a health care provider if:  Your symptoms get worse or do not improve with treatment.  You have continued or increased fluid, blood, or pus coming from the affected area.  Your finger or knuckle becomes swollen or difficult to move. Get help right away if you have:  A fever or chills.  Redness spreading away from the affected area.  Joint or muscle pain. Summary  Paronychia is an infection of the skin that surrounds a nail. It often causes pain and swelling around the nail. In some cases, a collection of pus (abscess) can form near or under the nail.  This condition may be caused by bacteria or a fungus. These germs can enter the body through an opening in the skin, such as a cut or a hangnail.  If your condition is mild, it may clear up on its own in a few days. If needed, treatment may include medicine  or a procedure to drain pus from an abscess.  To prevent this condition from happening again, wear gloves if doing tasks that require your hands to get wet or to come in contact with chemicals. Also avoid injuring your nails or fingertips. This information is not intended to replace advice given to you by your health care provider. Make sure you discuss any questions you have with your health care provider. Document Released: 04/05/2001 Document Revised: 10/23/2017 Document Reviewed: 10/23/2017 Elsevier Interactive Patient Education  2019 [employer].

## 2019-01-09 DIAGNOSIS — M7582 Other shoulder lesions, left shoulder: Secondary | ICD-10-CM | POA: Diagnosis not present

## 2019-01-09 DIAGNOSIS — M6281 Muscle weakness (generalized): Secondary | ICD-10-CM | POA: Diagnosis not present

## 2019-01-11 DIAGNOSIS — M6281 Muscle weakness (generalized): Secondary | ICD-10-CM | POA: Diagnosis not present

## 2019-01-11 DIAGNOSIS — M7582 Other shoulder lesions, left shoulder: Secondary | ICD-10-CM | POA: Diagnosis not present

## 2019-01-14 DIAGNOSIS — M7582 Other shoulder lesions, left shoulder: Secondary | ICD-10-CM | POA: Diagnosis not present

## 2019-01-14 DIAGNOSIS — M6281 Muscle weakness (generalized): Secondary | ICD-10-CM | POA: Diagnosis not present

## 2019-01-16 DIAGNOSIS — M7582 Other shoulder lesions, left shoulder: Secondary | ICD-10-CM | POA: Diagnosis not present

## 2019-01-16 DIAGNOSIS — M6281 Muscle weakness (generalized): Secondary | ICD-10-CM | POA: Diagnosis not present

## 2019-01-18 DIAGNOSIS — M7582 Other shoulder lesions, left shoulder: Secondary | ICD-10-CM | POA: Diagnosis not present

## 2019-01-18 DIAGNOSIS — M6281 Muscle weakness (generalized): Secondary | ICD-10-CM | POA: Diagnosis not present

## 2019-01-21 DIAGNOSIS — M6281 Muscle weakness (generalized): Secondary | ICD-10-CM | POA: Diagnosis not present

## 2019-01-21 DIAGNOSIS — M7582 Other shoulder lesions, left shoulder: Secondary | ICD-10-CM | POA: Diagnosis not present

## 2019-01-23 DIAGNOSIS — M6281 Muscle weakness (generalized): Secondary | ICD-10-CM | POA: Diagnosis not present

## 2019-01-23 DIAGNOSIS — M7522 Bicipital tendinitis, left shoulder: Secondary | ICD-10-CM | POA: Diagnosis not present

## 2019-01-23 DIAGNOSIS — M7582 Other shoulder lesions, left shoulder: Secondary | ICD-10-CM | POA: Diagnosis not present

## 2019-01-25 DIAGNOSIS — M7582 Other shoulder lesions, left shoulder: Secondary | ICD-10-CM | POA: Diagnosis not present

## 2019-01-25 DIAGNOSIS — M6281 Muscle weakness (generalized): Secondary | ICD-10-CM | POA: Diagnosis not present

## 2019-01-25 DIAGNOSIS — M7522 Bicipital tendinitis, left shoulder: Secondary | ICD-10-CM | POA: Diagnosis not present

## 2019-01-28 DIAGNOSIS — M6281 Muscle weakness (generalized): Secondary | ICD-10-CM | POA: Diagnosis not present

## 2019-01-28 DIAGNOSIS — M7582 Other shoulder lesions, left shoulder: Secondary | ICD-10-CM | POA: Diagnosis not present

## 2019-01-28 DIAGNOSIS — M7522 Bicipital tendinitis, left shoulder: Secondary | ICD-10-CM | POA: Diagnosis not present

## 2019-01-30 DIAGNOSIS — M6281 Muscle weakness (generalized): Secondary | ICD-10-CM | POA: Diagnosis not present

## 2019-01-30 DIAGNOSIS — M7522 Bicipital tendinitis, left shoulder: Secondary | ICD-10-CM | POA: Diagnosis not present

## 2019-01-30 DIAGNOSIS — M7582 Other shoulder lesions, left shoulder: Secondary | ICD-10-CM | POA: Diagnosis not present

## 2019-02-01 DIAGNOSIS — M7522 Bicipital tendinitis, left shoulder: Secondary | ICD-10-CM | POA: Diagnosis not present

## 2019-02-01 DIAGNOSIS — M6281 Muscle weakness (generalized): Secondary | ICD-10-CM | POA: Diagnosis not present

## 2019-02-01 DIAGNOSIS — M7582 Other shoulder lesions, left shoulder: Secondary | ICD-10-CM | POA: Diagnosis not present

## 2019-02-04 DIAGNOSIS — M7522 Bicipital tendinitis, left shoulder: Secondary | ICD-10-CM | POA: Diagnosis not present

## 2019-02-04 DIAGNOSIS — M6281 Muscle weakness (generalized): Secondary | ICD-10-CM | POA: Diagnosis not present

## 2019-02-04 DIAGNOSIS — M7582 Other shoulder lesions, left shoulder: Secondary | ICD-10-CM | POA: Diagnosis not present

## 2019-02-06 DIAGNOSIS — M6281 Muscle weakness (generalized): Secondary | ICD-10-CM | POA: Diagnosis not present

## 2019-02-06 DIAGNOSIS — M7582 Other shoulder lesions, left shoulder: Secondary | ICD-10-CM | POA: Diagnosis not present

## 2019-02-06 DIAGNOSIS — M7522 Bicipital tendinitis, left shoulder: Secondary | ICD-10-CM | POA: Diagnosis not present

## 2019-02-08 DIAGNOSIS — M7582 Other shoulder lesions, left shoulder: Secondary | ICD-10-CM | POA: Diagnosis not present

## 2019-02-08 DIAGNOSIS — Z1212 Encounter for screening for malignant neoplasm of rectum: Secondary | ICD-10-CM | POA: Diagnosis not present

## 2019-02-08 DIAGNOSIS — M6281 Muscle weakness (generalized): Secondary | ICD-10-CM | POA: Diagnosis not present

## 2019-02-08 DIAGNOSIS — Z1211 Encounter for screening for malignant neoplasm of colon: Secondary | ICD-10-CM | POA: Diagnosis not present

## 2019-02-08 DIAGNOSIS — M7522 Bicipital tendinitis, left shoulder: Secondary | ICD-10-CM | POA: Diagnosis not present

## 2019-02-11 DIAGNOSIS — M7522 Bicipital tendinitis, left shoulder: Secondary | ICD-10-CM | POA: Diagnosis not present

## 2019-02-11 DIAGNOSIS — M6281 Muscle weakness (generalized): Secondary | ICD-10-CM | POA: Diagnosis not present

## 2019-02-11 DIAGNOSIS — M7582 Other shoulder lesions, left shoulder: Secondary | ICD-10-CM | POA: Diagnosis not present

## 2019-02-13 ENCOUNTER — Encounter: Payer: Self-pay | Admitting: Internal Medicine

## 2019-02-13 ENCOUNTER — Telehealth: Payer: Self-pay | Admitting: Internal Medicine

## 2019-02-13 DIAGNOSIS — M7522 Bicipital tendinitis, left shoulder: Secondary | ICD-10-CM | POA: Diagnosis not present

## 2019-02-13 DIAGNOSIS — M6281 Muscle weakness (generalized): Secondary | ICD-10-CM | POA: Diagnosis not present

## 2019-02-13 DIAGNOSIS — M7582 Other shoulder lesions, left shoulder: Secondary | ICD-10-CM | POA: Diagnosis not present

## 2019-02-13 LAB — COLOGUARD: Cologuard: POSITIVE — AB

## 2019-02-13 NOTE — Telephone Encounter (Signed)
Her cologuard was positive 02/08/2019 this could be due to bleeding hemorrhoids, but it also tests for precancer and or colon cancer.   Due to prior difficult colonoscopies I do not rec. A colonoscopy in the future but we can refer her to GI once COVID 19 is done to discuss possibly other options if any to work this up   Normally with + cologuard we would rec colonoscopy but due to hers being difficult in 2017 I would not rec.   Is she agreeable to GI referral after COVID 19?   [employer]

## 2019-02-14 ENCOUNTER — Other Ambulatory Visit: Payer: Self-pay | Admitting: Internal Medicine

## 2019-02-14 DIAGNOSIS — R195 Other fecal abnormalities: Secondary | ICD-10-CM

## 2019-02-14 DIAGNOSIS — Z800 Family history of malignant neoplasm of digestive organs: Secondary | ICD-10-CM

## 2019-02-14 NOTE — Telephone Encounter (Signed)
Sent referral Dr. Lucio Edward   thanks [facility]

## 2019-02-14 NOTE — Telephone Encounter (Signed)
Pt called back and states that her husband sees a GI doctor that she wants to see as well.  Pt prefers to be referred to and have her colonoscopy done by Dr. Kennedy Bucker.

## 2019-02-14 NOTE — Telephone Encounter (Signed)
Patient was informed of results.  Patient understood and no questions, comments, or concerns at this time. Patient is ok with colonoscopy.

## 2019-02-15 DIAGNOSIS — M7522 Bicipital tendinitis, left shoulder: Secondary | ICD-10-CM | POA: Diagnosis not present

## 2019-02-15 DIAGNOSIS — M6281 Muscle weakness (generalized): Secondary | ICD-10-CM | POA: Diagnosis not present

## 2019-02-15 DIAGNOSIS — M7582 Other shoulder lesions, left shoulder: Secondary | ICD-10-CM | POA: Diagnosis not present

## 2019-02-19 ENCOUNTER — Encounter: Payer: Self-pay | Admitting: General Surgery

## 2019-02-19 DIAGNOSIS — M7522 Bicipital tendinitis, left shoulder: Secondary | ICD-10-CM | POA: Diagnosis not present

## 2019-02-19 DIAGNOSIS — M6281 Muscle weakness (generalized): Secondary | ICD-10-CM | POA: Diagnosis not present

## 2019-02-19 DIAGNOSIS — M7582 Other shoulder lesions, left shoulder: Secondary | ICD-10-CM | POA: Diagnosis not present

## 2019-02-20 ENCOUNTER — Ambulatory Visit (INDEPENDENT_AMBULATORY_CARE_PROVIDER_SITE_OTHER): Payer: Medicare Other | Admitting: Gastroenterology

## 2019-02-20 ENCOUNTER — Encounter: Payer: Self-pay | Admitting: Gastroenterology

## 2019-02-20 ENCOUNTER — Other Ambulatory Visit: Payer: Self-pay

## 2019-02-20 VITALS — Ht 64.0 in | Wt 130.0 lb

## 2019-02-20 DIAGNOSIS — R195 Other fecal abnormalities: Secondary | ICD-10-CM

## 2019-02-20 DIAGNOSIS — Z800 Family history of malignant neoplasm of digestive organs: Secondary | ICD-10-CM

## 2019-02-20 DIAGNOSIS — M6281 Muscle weakness (generalized): Secondary | ICD-10-CM | POA: Diagnosis not present

## 2019-02-20 DIAGNOSIS — M7582 Other shoulder lesions, left shoulder: Secondary | ICD-10-CM | POA: Diagnosis not present

## 2019-02-20 DIAGNOSIS — M7522 Bicipital tendinitis, left shoulder: Secondary | ICD-10-CM | POA: Diagnosis not present

## 2019-02-20 MED ORDER — NA SULFATE-K SULFATE-MG SULF 17.5-3.13-1.6 GM/177ML PO SOLN: 1.0000 | Freq: Once | ORAL | 0 refills | Status: AC

## 2019-02-20 NOTE — Progress Notes (Signed)
History of Present Illness: This is an 83 year old female referred by McLean-Scocuzza, Olivia Mackie * MD for the evaluation of a positive Cologuard.  She has a family history of colon cancer in her brother, about age 71, and her father, about age 73.  She relates an incomplete colonoscopy performed in [place] around 2014 however it was rescheduled, repeated and completed per her understanding - records not available.  Colonoscopy in 2017 as below.  She has no gastrointestinal complaints. Denies weight loss, abdominal pain, constipation, diarrhea, change in stool caliber, melena, hematochezia, nausea, vomiting, dysphagia, reflux symptoms, chest pain.   Colonoscopy 12/2015 Dr. Rayann Heman - Non-thrombosed external hemorrhoids found on perianal exam. - The entire examined colon is normal. - No specimens collected. - Unable to advance past desc colon due to severe adhesion and restricted mobility in the pelvis. Changed to upper endsocope which could not be passed further than the desc colon.    Allergies  Allergen Reactions  . Contrast Media [Iodinated Diagnostic Agents] Anaphylaxis   Outpatient Medications Prior to Visit  Medication Sig Dispense Refill  . aspirin EC 325 MG tablet Take 650 mg by mouth every 8 (eight) hours as needed (pain.).    Marland Kitchen eszopiclone (LUNESTA) 2 MG TABS tablet Take 2 mg by mouth at bedtime as needed (for sleep.).   2  . montelukast (SINGULAIR) 10 MG tablet Take 1 tablet (10 mg total) by mouth at bedtime. 30 tablet 0  . mupirocin ointment (BACTROBAN) 2 % Apply 1 application topically 3 (three) times daily. Left toe 30 g 1  . oxyCODONE (ROXICODONE) 5 MG immediate release tablet Take 1-2 tablets (5-10 mg total) by mouth every 4 (four) hours as needed. 40 tablet 0  . simvastatin (ZOCOR) 10 MG tablet Take 1 tablet (10 mg total) by mouth every Monday. 15 tablet 11   No facility-administered medications prior to visit.    Past Medical History:  Diagnosis Date  . Allergic rhinitis  due to pollen   . Blood present in stool   . 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 Endometriosis   . Environmental and seasonal allergies   . GERD (gastroesophageal reflux disease)   . Hyperlipidemia   . Osteopenia 11/12   T -1.8 in hip  . Sleep disturbance   . UTI (urinary tract infection)   . Vaginal atrophy    Past Surgical History:  Procedure Laterality Date  . ABDOMINAL HYSTERECTOMY     total   . APPENDECTOMY    . CATARACT EXTRACTION W/ INTRAOCULAR LENS  IMPLANT, BILATERAL    . COLONOSCOPY WITH PROPOFOL N/A 01/22/2016   Procedure: COLONOSCOPY WITH PROPOFOL;  Surgeon: Josefine Class, MD;  Location: [facility] ENDOSCOPY;  Service: Endoscopy;  Laterality: N/A;  . ROTATOR CUFF REPAIR Right ~2001  . SHOULDER ARTHROSCOPY WITH SUBACROMIAL DECOMPRESSION AND BICEP TENDON REPAIR Left 11/08/2018   Procedure: SHOULDER ARTHROSCOPY WITH DEBRIDEMENT, DECOMPRESSION AND ROTATOR CUFF REPAIR;  Surgeon: Corky Mull, MD;  Location: [facility] ORS;  Service: Orthopedics;  Laterality: Left;   Social History   Socioeconomic History  . Marital status: Married    Spouse name: Kara Pacer  . Number of children: 0  . Years of education: Not on file  . Highest education level: Not on file  Occupational History  . 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 Financial resource strain: Not hard at all  . Food insecurity:  Worry: Never true    Inability: Never true  . Transportation needs:    Medical: No    Non-medical: Not on file  Tobacco Use  . Smoking status: Former Smoker    Last attempt to quit: 10/30/1966    Years since quitting: 52.3  . Smokeless tobacco: Never Used  . Tobacco comment: as of 08/2017 quit smoking >15 years ago 1ppd x 35 years no FH lung cancer   Substance and Sexual Activity  . Alcohol use: Yes    Alcohol/week: 3.0 standard drinks    Types: 3 Shots of liquor per week    Comment: drinks gin  . Drug use: No  .  Sexual activity: Never  Lifestyle  . Physical activity:    Days per week: 4 days    Minutes per session: 60 min  . Stress: Not at all  Relationships  . Social connections:    Talks on phone: Not on file    Gets together: Not on file    Attends religious service: Not on file    Active member of club or organization: Not on file    Attends meetings of clubs or organizations: Not on file    Relationship status: Not on file  Other Topics Concern  . Not on file  Social History Narrative   Has living will   Husband is health care POA.   Would accept resuscitation but no prolonged ventilation   Not sure about tube feeds   Moved her from [place]    Exercises 3x per week    No kids    Family History  Problem Relation Age of Onset  . Cancer Mother        CLL  . Cancer Father        colon  . Aneurysm Father   . Cancer Brother        colon cancer  . Heart disease Maternal Uncle   . Diabetes Neg Hx   . Breast cancer Neg Hx   . Bladder Cancer Neg Hx   . Kidney cancer Neg Hx       Review of Systems: Pertinent positive and negative review of systems were noted in the above HPI section. All other review of systems were otherwise negative.   Physical Exam: Telemedicine - not performed   Assessment and Recommendations:  1. Positive Cologuard. Family history of colon cancer. Prior incomplete colonoscopy.  Rule out colorectal neoplasms.  Patient understands colonoscopy may not be successful / complete given prior incomplete colonoscopy.  Air contrasted barium enema and CT colonoscopy were discussed as options if colonoscopy is not complete or as first-line options if she prefers.  I recommended colonoscopy as the best option and she agrees. Schedule colonoscopy. The risks (including bleeding, perforation, infection, missed lesions, medication reactions and possible hospitalization or surgery if complications occur), benefits, and alternatives to colonoscopy with possible biopsy and  possible polypectomy were discussed with the patient and they consent to proceed.     These services were provided via telemedicine, audio only per patient request.  The patient was at home with her husband participating in the visit and the provider was in the office, alone.  We discussed the limitations of evaluation and management by telemedicine and the availability of in person appointments.  Patient consented for this telemedicine visit and is aware of possible charges for this service.  The other person participating in the telemedicine service was Marlon Pel, [place] who reviewed medications, allergies, past history and completed  AVS.  Time spent on call: 15 minutes    cc: McLean-Scocuzza, Nino Glow, MD [place], [place] [phone]

## 2019-02-20 NOTE — Patient Instructions (Signed)
You have been scheduled for a colonoscopy. Please follow written instructions given to you at your visit today.  Please pick up your prep supplies at the pharmacy within the next 1-3 days. If you use inhalers (even only as needed), please bring them with you on the day of your procedure.   

## 2019-02-22 DIAGNOSIS — M7582 Other shoulder lesions, left shoulder: Secondary | ICD-10-CM | POA: Diagnosis not present

## 2019-02-22 DIAGNOSIS — M6281 Muscle weakness (generalized): Secondary | ICD-10-CM | POA: Diagnosis not present

## 2019-02-22 DIAGNOSIS — M7522 Bicipital tendinitis, left shoulder: Secondary | ICD-10-CM | POA: Diagnosis not present

## 2019-02-25 DIAGNOSIS — M7522 Bicipital tendinitis, left shoulder: Secondary | ICD-10-CM | POA: Diagnosis not present

## 2019-02-25 DIAGNOSIS — M7582 Other shoulder lesions, left shoulder: Secondary | ICD-10-CM | POA: Diagnosis not present

## 2019-02-25 DIAGNOSIS — M6281 Muscle weakness (generalized): Secondary | ICD-10-CM | POA: Diagnosis not present

## 2019-02-27 IMAGING — MG DIGITAL SCREENING BILATERAL MAMMOGRAM WITH TOMO AND CAD
8 series · 9 of 24 positions shown · non-contrast
Comparison: Previous exam(s).

CLINICAL DATA: Screening.

EXAM:
DIGITAL SCREENING BILATERAL MAMMOGRAM WITH TOMO AND CAD

[R CC synth-2D]
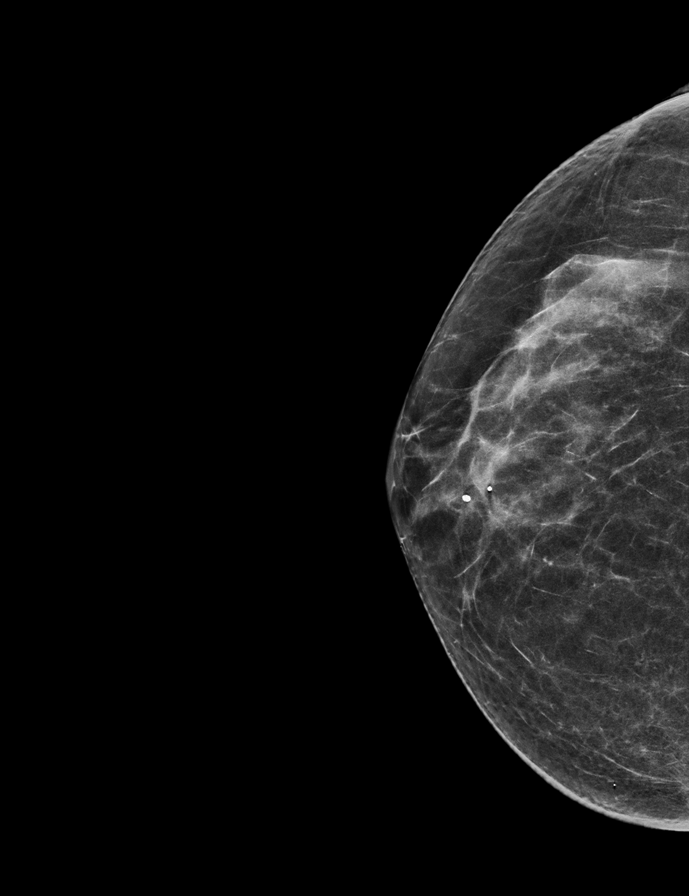

[L MLO synth-2D]
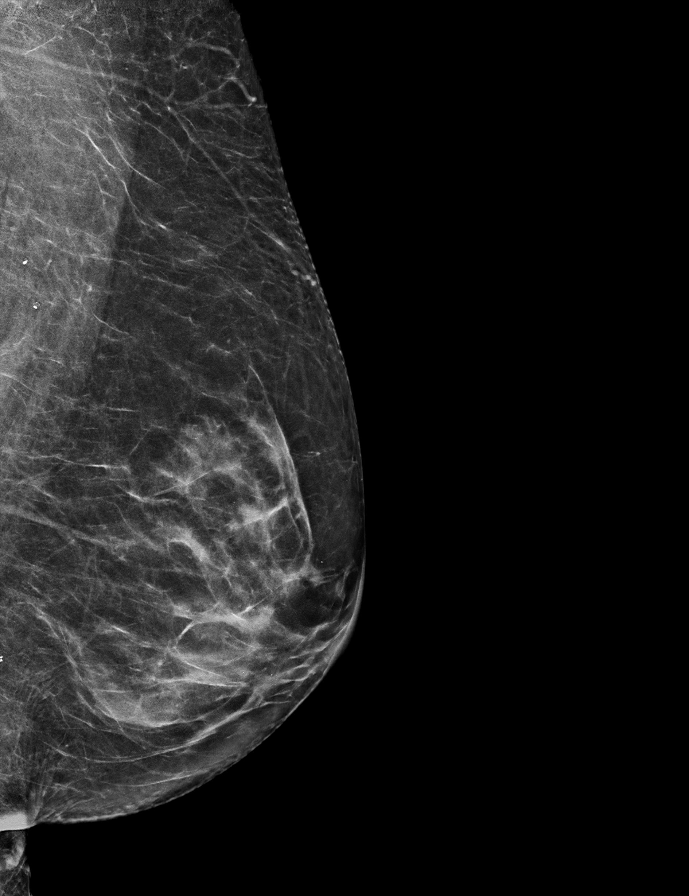

[L CC synth-2D]
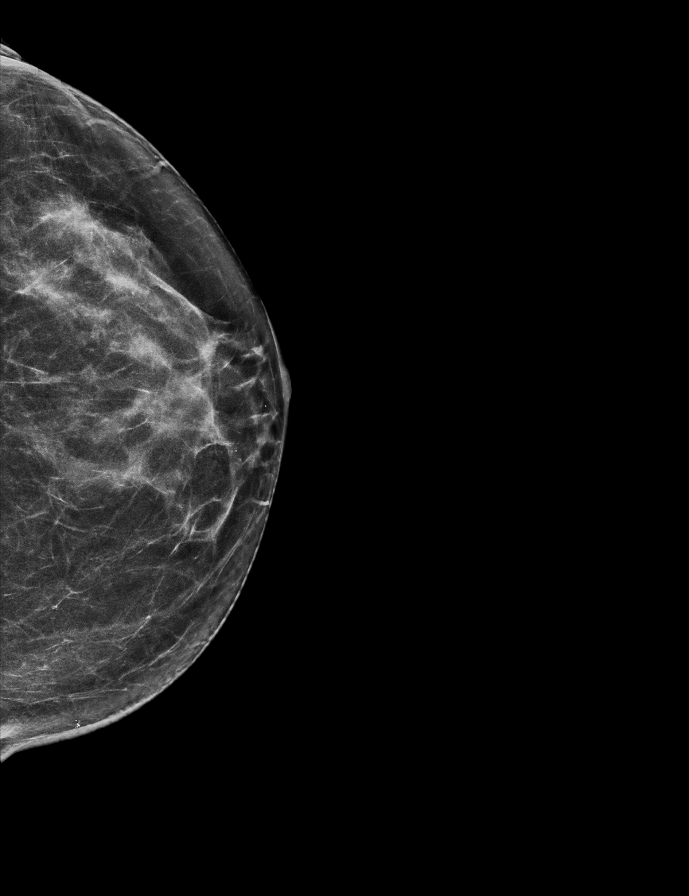

[R MLO synth-2D]
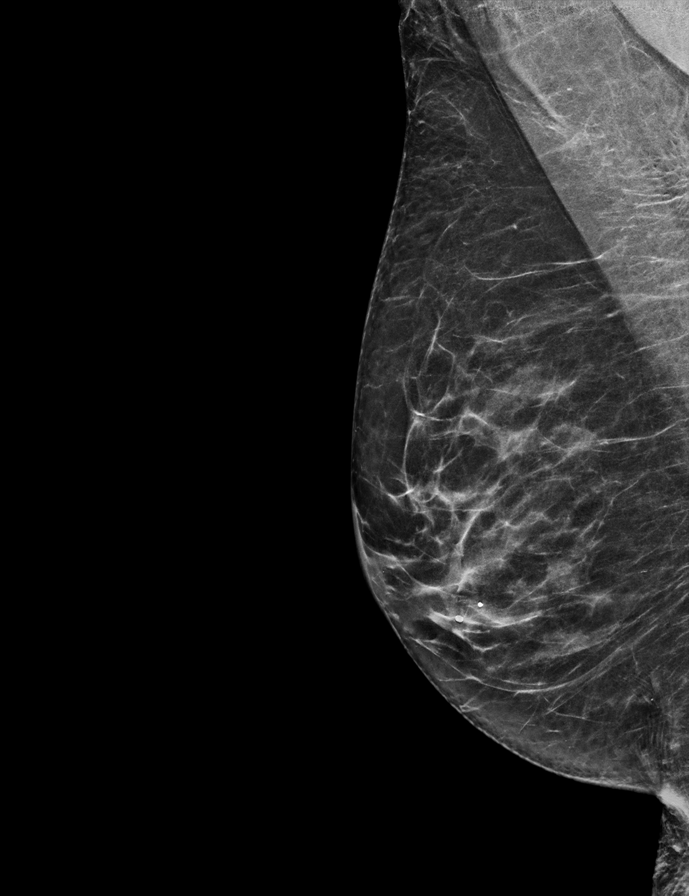

[L CC tomo · 2 of 63 frames shown]
[frame 21/63]
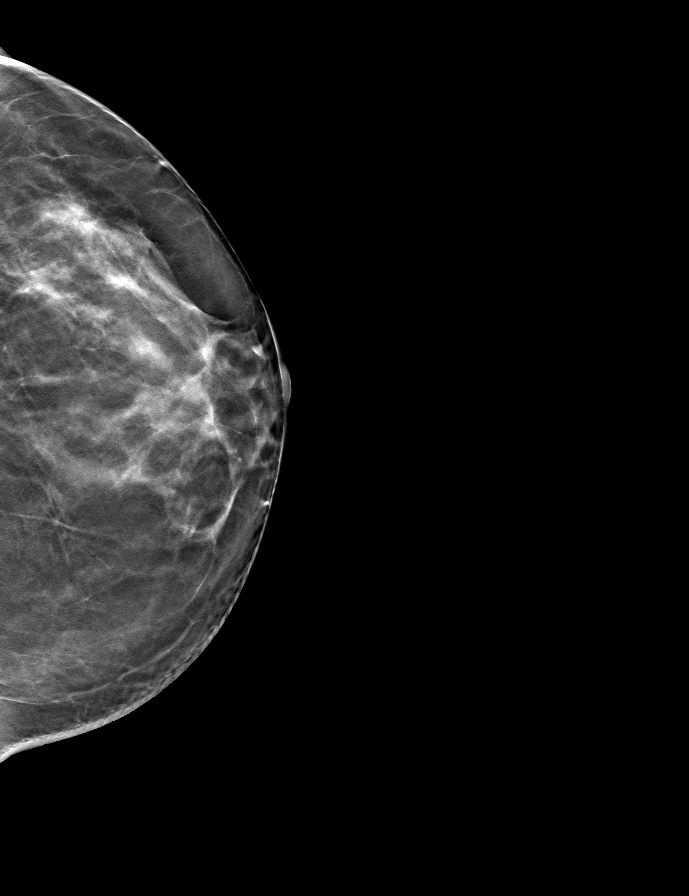
[frame 32/63]
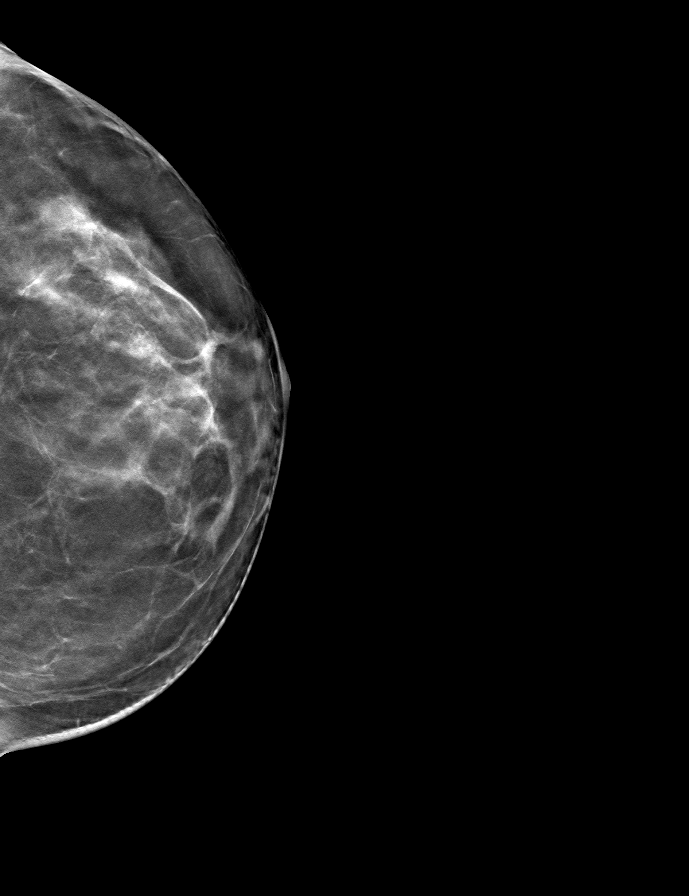

[L MLO tomo · tomo slice 33/64.0]
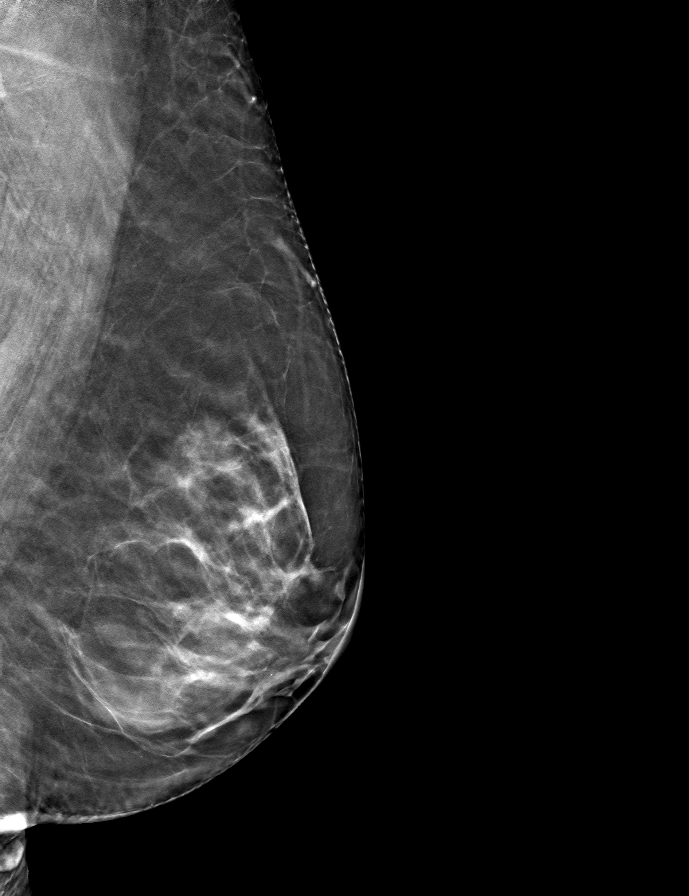

[R MLO tomo · tomo slice 30/59.0]
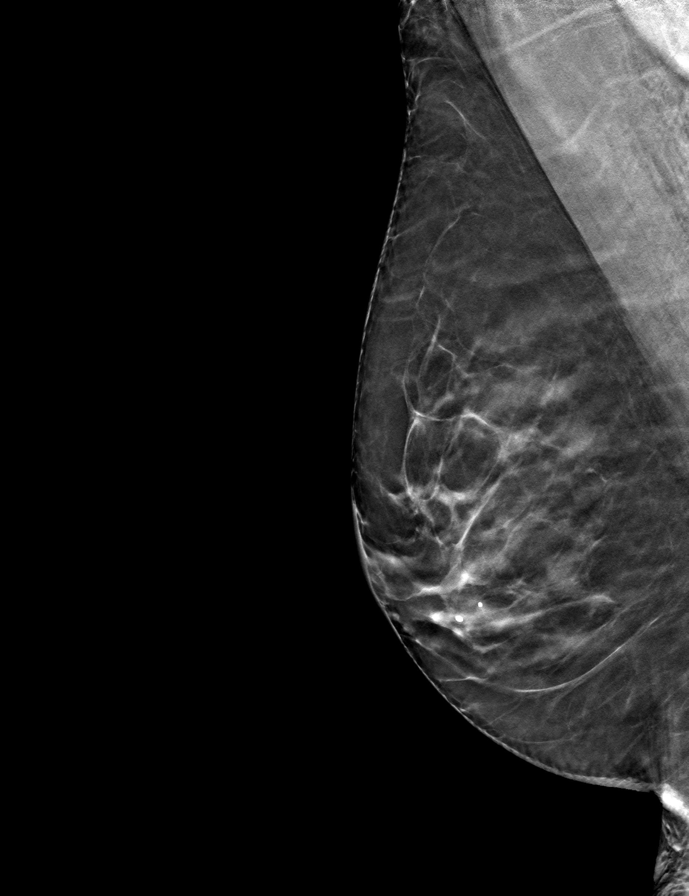

[R CC tomo · tomo slice 31/61.0]
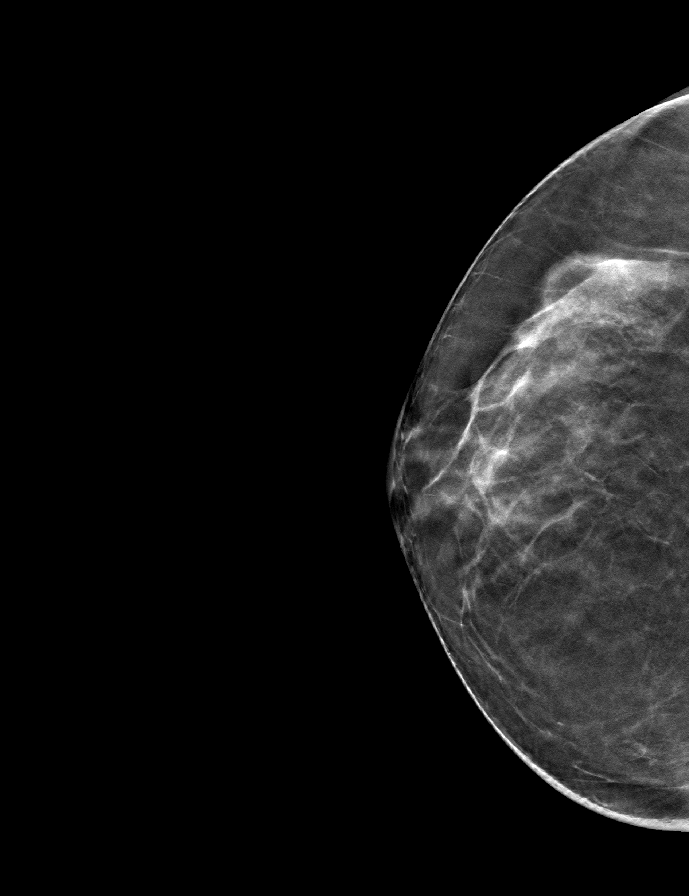

[9 of 24 positions shown; findings below may reference images not displayed]

ACR Breast Density Category c: The breast tissue is heterogeneously
dense, which may obscure small masses.
FINDINGS: There are no findings suspicious for malignancy. Images were
processed with CAD.
IMPRESSION: No mammographic evidence of malignancy. A result letter of this
screening mammogram will be mailed directly to the patient.

RECOMMENDATION:
Screening mammogram in one year. (Code:FT-U-LHB)

BI-RADS CATEGORY  1: Negative.

## 2019-03-04 ENCOUNTER — Telehealth: Payer: Self-pay | Admitting: *Deleted

## 2019-03-04 DIAGNOSIS — M7522 Bicipital tendinitis, left shoulder: Secondary | ICD-10-CM | POA: Diagnosis not present

## 2019-03-04 DIAGNOSIS — M6281 Muscle weakness (generalized): Secondary | ICD-10-CM | POA: Diagnosis not present

## 2019-03-04 DIAGNOSIS — M7582 Other shoulder lesions, left shoulder: Secondary | ICD-10-CM | POA: Diagnosis not present

## 2019-03-04 NOTE — Telephone Encounter (Signed)
Covid-19 travel screening questions  Have you traveled in the last 14 days? Yes If yes where? [place]  Do you now or have you had a fever in the last 14 days? No  Do you have any respiratory symptoms of shortness of breath or cough now or in the last 14 days? No  Do you have any family members or close contacts with diagnosed or suspected Covid-19? No

## 2019-03-05 ENCOUNTER — Encounter: Payer: Self-pay | Admitting: Gastroenterology

## 2019-03-05 ENCOUNTER — Ambulatory Visit (AMBULATORY_SURGERY_CENTER): Payer: Medicare Other | Admitting: Gastroenterology

## 2019-03-05 ENCOUNTER — Other Ambulatory Visit: Payer: Self-pay

## 2019-03-05 VITALS — BP 153/66 | HR 56 | Temp 98.5°F | Resp 14 | Ht 64.0 in | Wt 130.0 lb

## 2019-03-05 DIAGNOSIS — R195 Other fecal abnormalities: Secondary | ICD-10-CM | POA: Diagnosis not present

## 2019-03-05 DIAGNOSIS — K640 First degree hemorrhoids: Secondary | ICD-10-CM

## 2019-03-05 DIAGNOSIS — E7800 Pure hypercholesterolemia, unspecified: Secondary | ICD-10-CM | POA: Diagnosis not present

## 2019-03-05 DIAGNOSIS — Z800 Family history of malignant neoplasm of digestive organs: Secondary | ICD-10-CM | POA: Diagnosis not present

## 2019-03-05 MED ORDER — SODIUM CHLORIDE 0.9 % IV SOLN: 500.0000 mL | Freq: Once | INTRAVENOUS | Status: DC

## 2019-03-05 NOTE — Patient Instructions (Signed)
YOU HAD AN ENDOSCOPIC PROCEDURE TODAY AT [facility]:   Refer to the procedure report that was given to you for any specific questions about what was found during the examination.  If the procedure report does not answer your questions, please call your gastroenterologist to clarify.  If you requested that your care partner not be given the details of your procedure findings, then the procedure report has been included in a sealed envelope for you to review at your convenience later.  YOU SHOULD EXPECT: Some feelings of bloating in the abdomen. Passage of more gas than usual.  Walking can help get rid of the air that was put into your GI tract during the procedure and reduce the bloating. If you had a lower endoscopy (such as a colonoscopy or flexible sigmoidoscopy) you may notice spotting of blood in your stool or on the toilet paper. If you underwent a bowel prep for your procedure, you may not have a normal bowel movement for a few days.  Please Note:  You might notice some irritation and congestion in your nose or some drainage.  This is from the oxygen used during your procedure.  There is no need for concern and it should clear up in a day or so.  SYMPTOMS TO REPORT IMMEDIATELY:   Following lower endoscopy (colonoscopy or flexible sigmoidoscopy):  Excessive amounts of blood in the stool  Significant tenderness or worsening of abdominal pains  Swelling of the abdomen that is new, acute  Fever of 100F or higher   For urgent or emergent issues, a gastroenterologist can be reached at any hour by calling [phone].   DIET:  We do recommend a small meal at first, but then you may proceed to your regular diet.  Drink plenty of fluids but you should avoid alcoholic beverages for 24 hours.  ACTIVITY:  You should plan to take it easy for the rest of today and you should NOT DRIVE or use heavy machinery until tomorrow (because of the sedation medicines used during the test).     FOLLOW UP: Our staff will call the number listed on your records the next business day following your procedure to check on you and address any questions or concerns that you may have regarding the information given to you following your procedure. If we do not reach you, we will leave a message.  However, if you are feeling well and you are not experiencing any problems, there is no need to return our call.  We will assume that you have returned to your regular daily activities without incident. We will be calling you two weeks following your procedure to see if you have developed any symptoms of the COVID-19.  If you develop any symptoms before then, please let us know.  If any biopsies were taken you will be contacted by phone or by letter within the next 1-3 weeks.  Please call us at [phone] if you have not heard about the biopsies in 3 weeks.    SIGNATURES/CONFIDENTIALITY: You and/or your care partner have signed paperwork which will be entered into your electronic medical record.  These signatures attest to the fact that that the information above on your After Visit Summary has been reviewed and is understood.  Full responsibility of the confidentiality of this discharge information lies with you and/or your care-partner.  Thank-you for coming to see [place] today.

## 2019-03-05 NOTE — Progress Notes (Signed)
Pt's states no medical or surgical changes since previsit or office visit. 

## 2019-03-05 NOTE — Progress Notes (Signed)
Dr Fuller Plan called the husband and spoke to him regarding his wife's procedure.  He is to call our number if he has any questions.

## 2019-03-05 NOTE — Op Note (Signed)
[place] Patient Name: Annette Lee Procedure Date: 03/05/2019 9:08 AM MRN: [id number] Endoscopist: Ladene Artist , MD Age: 83 Referring MD:  Date of Birth: 1936/06/15 Gender: Female Account #: [id number] Procedure:                Colonoscopy Indications:              Positive Cologuard test. Family history of colon                            cancer. Medicines:                Monitored Anesthesia Care Procedure:                Pre-Anesthesia Assessment:                           - Prior to the procedure, a History and Physical                            was performed, and patient medications and                            allergies were reviewed. The patient's tolerance of                            previous anesthesia was also reviewed. The risks                            and benefits of the procedure and the sedation                            options and risks were discussed with the patient.                            All questions were answered, and informed consent                            was obtained. Prior Anticoagulants: The patient has                            taken no previous anticoagulant or antiplatelet                            agents. ASA Grade Assessment: II - A patient with                            mild systemic disease. After reviewing the risks                            and benefits, the patient was deemed in                            satisfactory condition to undergo the procedure.  After obtaining informed consent, the colonoscope                            was passed under direct vision. Throughout the                            procedure, the patient's blood pressure, pulse, and                            oxygen saturations were monitored continuously. The                            Model PCF-[id number]) scope was introduced                            through the anus and advanced to the the cecum,                           identified by appendiceal orifice and ileocecal                            valve. The ileocecal valve, appendiceal orifice,                            and rectum were photographed. The quality of the                            bowel preparation was good. The patient tolerated                            the procedure well. The colonoscopy was technically                            difficult and complex due to restricted mobility of                            the colon and a tortuous colon. Successful                            completion of the procedure was aided by changing                            the patient to a supine position, changing the                            patient to a prone position, using manual pressure,                            withdrawing and reinserting the scope,                            straightening and shortening the scope to obtain  bowel loop reduction and using scope torsion.                            Limited visualization at sharp, fixed turns in                            several areas. Scope In: 9:21:53 AM Scope Out: 9:49:02 AM Scope Withdrawal Time: 0 hours 12 minutes 28 seconds  Total Procedure Duration: 0 hours 27 minutes 9 seconds  Findings:                 The perianal and digital rectal examinations were                            normal.                           Internal hemorrhoids were found during                            retroflexion. The hemorrhoids were small and Grade                            I (internal hemorrhoids that do not prolapse).                           The exam was otherwise without abnormality on                            direct and retroflexion views. Complications:            No immediate complications. Estimated blood loss:                            None. Estimated Blood Loss:     Estimated blood loss: none. Impression:               - Internal hemorrhoids.                            - The examination was otherwise normal on direct                            and retroflexion views.                           - No specimens collected. Recommendation:           - Patient has a contact number available for                            emergencies. The signs and symptoms of potential                            delayed complications were discussed with the                            patient. Return to normal  activities tomorrow.                            Written discharge instructions were provided to the                            patient.                           - Resume previous diet.                           - Continue present medications.                           - No repeat colonoscopy due to age. Ladene Artist, MD 03/05/2019 9:54:06 AM This report has been signed electronically.

## 2019-03-05 NOTE — Progress Notes (Signed)
Report given to PACU, vss 

## 2019-03-07 ENCOUNTER — Telehealth: Payer: Self-pay | Admitting: *Deleted

## 2019-03-07 NOTE — Telephone Encounter (Signed)
  Follow up Call-  Call back number 03/05/2019  Post procedure Call Back phone  # [phone]  Permission to leave phone message Yes  Some recent data might be hidden     Patient questions:  Do you have a fever, pain , or abdominal swelling? No. Pain Score  0 *  Have you tolerated food without any problems? Yes.    Have you been able to return to your normal activities? Yes.    Do you have any questions about your discharge instructions: Diet   No. Medications  No. Follow up visit  No.  Do you have questions or concerns about your Care? No.  Actions: * If pain score is 4 or above: No action needed, pain <4.  1. Have you developed a fever since your procedure? no  2.   Have you had an respiratory symptoms (SOB or cough) since your procedure? no  3.   Have you tested positive for COVID 19 since your procedure no  3.   Have you had any family members/close contacts diagnosed with the COVID 19 since your procedure?  No  Pt states she appreciates Dr Fuller Plan calling her husband for her and she raved about the care she received here - no problems to note    If any of these questions are a yes, please inquire if patient has been seen by family doctor and route this note to Joylene John, Therapist, sports.

## 2019-03-08 DIAGNOSIS — M7522 Bicipital tendinitis, left shoulder: Secondary | ICD-10-CM | POA: Diagnosis not present

## 2019-03-08 DIAGNOSIS — M6281 Muscle weakness (generalized): Secondary | ICD-10-CM | POA: Diagnosis not present

## 2019-03-08 DIAGNOSIS — M7582 Other shoulder lesions, left shoulder: Secondary | ICD-10-CM | POA: Diagnosis not present

## 2019-03-25 DIAGNOSIS — M7582 Other shoulder lesions, left shoulder: Secondary | ICD-10-CM | POA: Diagnosis not present

## 2019-03-25 DIAGNOSIS — M75122 Complete rotator cuff tear or rupture of left shoulder, not specified as traumatic: Secondary | ICD-10-CM | POA: Diagnosis not present

## 2019-03-25 DIAGNOSIS — M7522 Bicipital tendinitis, left shoulder: Secondary | ICD-10-CM | POA: Diagnosis not present

## 2019-03-26 DIAGNOSIS — L711 Rhinophyma: Secondary | ICD-10-CM | POA: Diagnosis not present

## 2019-03-26 DIAGNOSIS — C44311 Basal cell carcinoma of skin of nose: Secondary | ICD-10-CM | POA: Diagnosis not present

## 2019-06-08 ENCOUNTER — Other Ambulatory Visit: Payer: Self-pay | Admitting: Radiology

## 2019-06-08 DIAGNOSIS — Z20822 Contact with and (suspected) exposure to covid-19: Secondary | ICD-10-CM

## 2019-06-09 LAB — NOVEL CORONAVIRUS, NAA: SARS-CoV-2, NAA: NOT DETECTED

## 2019-06-10 DIAGNOSIS — M7522 Bicipital tendinitis, left shoulder: Secondary | ICD-10-CM | POA: Diagnosis not present

## 2019-06-10 DIAGNOSIS — M7582 Other shoulder lesions, left shoulder: Secondary | ICD-10-CM | POA: Diagnosis not present

## 2019-06-10 DIAGNOSIS — M75122 Complete rotator cuff tear or rupture of left shoulder, not specified as traumatic: Secondary | ICD-10-CM | POA: Diagnosis not present

## 2019-06-24 ENCOUNTER — Other Ambulatory Visit: Payer: Self-pay

## 2019-06-24 ENCOUNTER — Ambulatory Visit (INDEPENDENT_AMBULATORY_CARE_PROVIDER_SITE_OTHER): Payer: Medicare Other

## 2019-06-24 DIAGNOSIS — Z23 Encounter for immunization: Secondary | ICD-10-CM | POA: Diagnosis not present

## 2019-06-25 ENCOUNTER — Encounter: Payer: Self-pay | Admitting: Internal Medicine

## 2019-07-15 DIAGNOSIS — D485 Neoplasm of uncertain behavior of skin: Secondary | ICD-10-CM | POA: Diagnosis not present

## 2019-07-15 DIAGNOSIS — L821 Other seborrheic keratosis: Secondary | ICD-10-CM | POA: Diagnosis not present

## 2019-07-15 DIAGNOSIS — I788 Other diseases of capillaries: Secondary | ICD-10-CM | POA: Diagnosis not present

## 2019-07-15 DIAGNOSIS — Z08 Encounter for follow-up examination after completed treatment for malignant neoplasm: Secondary | ICD-10-CM | POA: Diagnosis not present

## 2019-07-15 DIAGNOSIS — Z85828 Personal history of other malignant neoplasm of skin: Secondary | ICD-10-CM | POA: Diagnosis not present

## 2019-07-15 DIAGNOSIS — X32XXXA Exposure to sunlight, initial encounter: Secondary | ICD-10-CM | POA: Diagnosis not present

## 2019-07-15 DIAGNOSIS — L570 Actinic keratosis: Secondary | ICD-10-CM | POA: Diagnosis not present

## 2019-07-18 ENCOUNTER — Ambulatory Visit (INDEPENDENT_AMBULATORY_CARE_PROVIDER_SITE_OTHER): Payer: Medicare Other

## 2019-07-18 ENCOUNTER — Encounter: Payer: Self-pay | Admitting: Internal Medicine

## 2019-07-18 ENCOUNTER — Ambulatory Visit (INDEPENDENT_AMBULATORY_CARE_PROVIDER_SITE_OTHER): Payer: Medicare Other | Admitting: Internal Medicine

## 2019-07-18 ENCOUNTER — Other Ambulatory Visit: Payer: Self-pay

## 2019-07-18 DIAGNOSIS — Z9889 Other specified postprocedural states: Secondary | ICD-10-CM

## 2019-07-18 DIAGNOSIS — Z1389 Encounter for screening for other disorder: Secondary | ICD-10-CM | POA: Diagnosis not present

## 2019-07-18 DIAGNOSIS — E785 Hyperlipidemia, unspecified: Secondary | ICD-10-CM

## 2019-07-18 DIAGNOSIS — Z1329 Encounter for screening for other suspected endocrine disorder: Secondary | ICD-10-CM

## 2019-07-18 DIAGNOSIS — Z0000 Encounter for general adult medical examination without abnormal findings: Secondary | ICD-10-CM

## 2019-07-18 DIAGNOSIS — M810 Age-related osteoporosis without current pathological fracture: Secondary | ICD-10-CM | POA: Diagnosis not present

## 2019-07-18 NOTE — Progress Notes (Addendum)
Subjective:   Annette Lee is a 83 y.o. female who presents for Medicare Annual (Subsequent) preventive examination.  Review of Systems:  No ROS.  Medicare Wellness Virtual Visit.  Visual/audio telehealth visit, UTA vital signs.   See social history for additional risk factors.   Cardiac Risk Factors include: advanced age (>64men, >74 women)     Objective:     Vitals: There were no vitals taken for this visit.  There is no height or weight on file to calculate BMI.  Advanced Directives 07/18/2019 07/16/2018 07/10/2017 06/29/2016  Does Patient Have a Medical Advance Directive? Yes Yes Yes Yes  Type of Paramedic of [place];Living will [place];Living will [place];Living will [place];Living will  Does patient want to make changes to medical advance directive? No - Patient declined No - Patient declined - No - Patient declined  Copy of [place] in Chart? No - copy requested No - copy requested Yes No - copy requested    Tobacco Social History   Tobacco Use  Smoking Status Former Smoker  . Quit date: 10/30/1966  . Years since quitting: 52.7  Smokeless Tobacco Never Used  Tobacco Comment   as of 08/2017 quit smoking >15 years ago 1ppd x 35 years no FH lung cancer      Counseling given: Not Answered Comment: as of 08/2017 quit smoking >15 years ago 1ppd x 35 years no FH lung cancer    Clinical Intake:  Pre-visit preparation completed: Yes        Diabetes: No  How often do you need to have someone help you when you read instructions, pamphlets, or other written materials from your doctor or pharmacy?: 1 - Never  Interpreter Needed?: No     Past Medical History:  Diagnosis Date  . Allergic rhinitis due to pollen   . Blood present in stool   . 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 Endometriosis   . Environmental and seasonal allergies    . GERD (gastroesophageal reflux disease)   . Hyperlipidemia   . Osteopenia 11/12   T -1.8 in hip  . Sleep disturbance   . UTI (urinary tract infection)   . Vaginal atrophy    Past Surgical History:  Procedure Laterality Date  . ABDOMINAL HYSTERECTOMY     total   . APPENDECTOMY    . CATARACT EXTRACTION W/ INTRAOCULAR LENS  IMPLANT, BILATERAL    . COLONOSCOPY WITH PROPOFOL N/A 01/22/2016   Procedure: COLONOSCOPY WITH PROPOFOL;  Surgeon: Josefine Class, MD;  Location: [facility] ENDOSCOPY;  Service: Endoscopy;  Laterality: N/A;  . ROTATOR CUFF REPAIR Right ~2001  . SHOULDER ARTHROSCOPY WITH SUBACROMIAL DECOMPRESSION AND BICEP TENDON REPAIR Left 11/08/2018   Procedure: SHOULDER ARTHROSCOPY WITH DEBRIDEMENT, DECOMPRESSION AND ROTATOR CUFF REPAIR;  Surgeon: Corky Mull, MD;  Location: [facility] ORS;  Service: Orthopedics;  Laterality: Left;   Family History  Problem Relation Age of Onset  . Cancer Mother        CLL  . Cancer Father        colon  . Aneurysm Father   . Cancer Brother        colon cancer  . Heart disease Maternal Uncle   . Diabetes Neg Hx   . Breast cancer Neg Hx   . Bladder Cancer Neg Hx   . Kidney cancer Neg Hx    Social  History   Socioeconomic History  . Marital status: Married    Spouse name: Kara Pacer  . Number of children: 0  . Years of education: Not on file  . Highest education level: Not on file  Occupational History  . 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 Financial resource strain: Not hard at all  . Food insecurity    Worry: Never true    Inability: Never true  . Transportation needs    Medical: No    Non-medical: Not on file  Tobacco Use  . Smoking status: Former Smoker    Quit date: 10/30/1966    Years since quitting: 52.7  . Smokeless tobacco: Never Used  . Tobacco comment: as of 08/2017 quit smoking >15 years ago 1ppd x 35 years no FH lung cancer   Substance and Sexual Activity  . Alcohol use:  Yes    Alcohol/week: 2.0 standard drinks    Types: 2 Shots of liquor per week    Comment: drinks gin per week  . Drug use: No  . Sexual activity: Never  Lifestyle  . Physical activity    Days per week: 4 days    Minutes per session: 60 min  . Stress: Not at all  Relationships  . Social Herbalist on phone: Not on file    Gets together: Not on file    Attends religious service: Not on file    Active member of club or organization: Not on file    Attends meetings of clubs or organizations: Not on file    Relationship status: Not on file  Other Topics Concern  . Not on file  Social History Narrative   Has living will   Husband is health care POA.   Would accept resuscitation but no prolonged ventilation   Not sure about tube feeds   Moved her from [place]    Exercises 3x per week    No kids     Outpatient Encounter Medications as of 07/18/2019  Medication Sig  . eszopiclone (LUNESTA) 2 MG TABS tablet Take 2 mg by mouth at bedtime as needed (for sleep.).   Marland Kitchen simvastatin (ZOCOR) 10 MG tablet Take 1 tablet (10 mg total) by mouth every Monday.  . [DISCONTINUED] aspirin EC 325 MG tablet Take 650 mg by mouth every 8 (eight) hours as needed (pain.).  . [DISCONTINUED] montelukast (SINGULAIR) 10 MG tablet Take 1 tablet (10 mg total) by mouth at bedtime. (Patient not taking: Reported on 03/05/2019)   No facility-administered encounter medications on file as of 07/18/2019.     Activities of Daily Living In your present state of health, do you have any difficulty performing the following activities: 07/18/2019 10/30/2018  Hearing? N Y  Comment - ringing in ears  Vision? N N  Difficulty concentrating or making decisions? N Y  Walking or climbing stairs? N N  Dressing or bathing? N N  Doing errands, shopping? N N  Preparing Food and eating ? N -  Using the Toilet? N -  In the past six months, have you accidently leaked urine? N -  Do you have problems with loss of bowel  control? N -  Managing your Medications? N -  Managing your Finances? N -  Housekeeping or managing your Housekeeping? N -  Some recent data might be hidden    Patient Care Team: McLean-Scocuzza, Nino Glow, MD as PCP - General (Internal Medicine) Art Esperanza Sheets, MD as Consulting  Physician (General Practice)    Assessment:   This is a routine wellness examination for [place].  I connected with patient 07/18/19 at  9:00 AM EDT by an audio enabled telemedicine application and verified that I am speaking with the correct person using two identifiers. Patient stated full name and DOB. Patient gave permission to continue with virtual visit. Patient's location was at home and Nurse's location was at [place] office.   Health Maintenance Due: Update all pending maintenance due as appropriate.   See completed HM at the end of note.   Eye: Visual acuity not assessed. Virtual visit. Wears corrective lenses. Followed by their ophthalmologist every 12 months.   Dental: Visits every 12 months.    Hearing: Demonstrates normal hearing during visit.  Safety:  Patient feels safe at home- yes Patient does have smoke detectors at home- yes Patient does wear sunscreen or protective clothing when in direct sunlight - yes Patient does wear seat belt when in a moving vehicle - yes Patient drives- yes Adequate lighting in walkways free from debris- yes Grab bars and handrails used as appropriate- yes Ambulates with no assistive device Cell phone on person when ambulating outside of the home- yes  Social: Alcohol intake - yes      Smoking history- former  Smokers in home? none Illicit drug use? none  Depression: PHQ 2 &9 complete. See screening below. Denies irritability, anhedonia, sadness/tearfullness.  Stable.   Falls: See screening below.    Medication: Taking as directed and without issues.   Covid-19: Precautions and sickness symptoms discussed. Wears mask, social distancing, hand  hygiene as appropriate.   Activities of Daily Living Patient denies needing assistance with: household chores, feeding themselves, getting from bed to chair, getting to the toilet, bathing/showering, dressing, managing money, or preparing meals.   Memory: Patient is alert. Patient denies difficulty focusing or concentrating. Correctly identified the president of the [place], season and recall. Patient likes to read, quilts, sings, and complete puzzles for brain stimulation.  BMI- discussed the importance of a healthy diet, water intake and the benefits of aerobic exercise.  Educational material provided.  Physical activity- walking 1-3 miles every other day.  Diet: Healthy Water: good intake Caffeine: 1-2 cups   Other Providers Patient Care Team: McLean-Scocuzza, Nino Glow, MD as PCP - General (Internal Medicine) Art Esperanza Sheets, MD as Consulting Physician (General Practice)  Exercise Activities and Dietary recommendations Current Exercise Habits: Home exercise routine, Type of exercise: walking, Intensity: Mild  Goals    . Maintain Healthy Lifestyle     Stay active Stay hydrated Healthy diet       Fall Risk Fall Risk  07/18/2019 07/16/2018 01/15/2018 07/10/2017 06/29/2016  Falls in the past year? 0 No No No No   Timed Get Up and Go performed: no, virtual visit  Depression Screen PHQ 2/9 Scores 07/18/2019 07/16/2018 01/15/2018 07/10/2017  PHQ - 2 Score 0 0 0 0  PHQ- 9 Score - - - 0     Cognitive Function MMSE - Mini Mental State Exam 07/10/2017 06/29/2016  Orientation to time 5 5  Orientation to Place 5 5  Registration 3 3  Attention/ Calculation 0 0  Recall 3 3  Language- name 2 objects 0 0  Language- repeat 1 1  Language- follow 3 step command 3 3  Language- read & follow direction 0 0  Write a sentence 0 0  Copy design 0 0  Total score 20 20     6CIT Screen 07/18/2019 07/16/2018  What Year? 0 points 0 points  What month? 0 points 0 points  What time? 0 points 0 points   Count back from 20 0 points 0 points  Months in reverse 0 points 0 points  Repeat phrase 0 points 0 points  Total Score 0 0    Immunization History  Administered Date(s) Administered  . Fluad Quad(high Dose 65+) 06/24/2019  . Influenza Split 08/02/2013  . Influenza,inj,Quad PF,6+ Mos 07/08/2014, 07/13/2015, 06/29/2016, 07/10/2017, 07/16/2018  . Pneumococcal Conjugate-13 09/11/2014  . Pneumococcal Polysaccharide-23 09/10/2013  . Tdap 12/25/2017  . Zoster 09/11/2014   Screening Tests Health Maintenance  Topic Date Due  . TETANUS/TDAP  12/26/2027  . INFLUENZA VACCINE  Completed  . DEXA SCAN  Completed  . PNA vac Low Risk Adult  Completed      Plan:   Keep all routine maintenance appointments.   Follow up with your doctor today 07/18/19 @ 2:00.   Medicare Attestation I have personally reviewed: The patient's medical and social history Their use of alcohol, tobacco or illicit drugs Their current medications and supplements The patient's functional ability including ADLs,fall risks, home safety risks, cognitive, and hearing and visual impairment Diet and physical activities Evidence for depression   In addition, I have reviewed and discussed with patient certain preventive protocols, quality metrics, and best practice recommendations. A written personalized care plan for preventive services as well as general preventive health recommendations were provided to patient via mail.     Varney Biles, LPN  [id number]

## 2019-07-18 NOTE — Progress Notes (Signed)
Telephone Note  I connected with Annette Lee  on 07/18/19 at  2:30 PM EDT by telephone and verified that I am speaking with the correct person using two identifiers.  Location patient: home Location provider:work or home office Persons participating in the virtual visit: patient, provider  I discussed the limitations of evaluation and management by telemedicine and the availability of in person appointments. The patient expressed understanding and agreed to proceed.   HPI: 1. H/o NMSC recently seen dermatology and had bx 3 days ago w/o report as of yet  2. Left shoulder doing well s/p surgery w/o issues  3. HLD takes zocor 10 mg qmonday 4. Osteoporosis does not want to do prolia for now  ROS: See pertinent positives and negatives per HPI.  Past Medical History:  Diagnosis Date  . Allergic rhinitis due to pollen   . Blood present in stool   . 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 Endometriosis   . Environmental and seasonal allergies   . GERD (gastroesophageal reflux disease)   . Hyperlipidemia   . Osteopenia 11/12   T -1.8 in hip  . Sleep disturbance   . UTI (urinary tract infection)   . Vaginal atrophy     Past Surgical History:  Procedure Laterality Date  . ABDOMINAL HYSTERECTOMY     total   . APPENDECTOMY    . CATARACT EXTRACTION W/ INTRAOCULAR LENS  IMPLANT, BILATERAL    . COLONOSCOPY WITH PROPOFOL N/A 01/22/2016   Procedure: COLONOSCOPY WITH PROPOFOL;  Surgeon: Josefine Class, MD;  Location[facility] ENDOSCOPY;  Service: Endoscopy;  Laterality: N/A;  . ROTATOR CUFF REPAIR Right ~2001  . SHOULDER ARTHROSCOPY WITH SUBACROMIAL DECOMPRESSION AND BICEP TENDON REPAIR Left 11/08/2018   Procedure: SHOULDER ARTHROSCOPY WITH DEBRIDEMENT, DECOMPRESSION AND ROTATOR CUFF REPAIR;  Surgeon: Corky Mull, MD;  Location: [facility] ORS;  Service: Orthopedics;  Laterality: Left;    Family History  Problem Relation Age of  Onset  . Cancer Mother        CLL  . Cancer Father        colon  . Aneurysm Father   . Cancer Brother        colon cancer  . Heart disease Maternal Uncle   . Diabetes Neg Hx   . Breast cancer Neg Hx   . Bladder Cancer Neg Hx   . Kidney cancer Neg Hx     SOCIAL HX:  Has living will Husband is health care POA. Would accept resuscitation but no prolonged ventilation Not sure about tube feeds Moved her from [place]  Exercises 3x per week  No kids    Current Outpatient Medications:  .  eszopiclone (LUNESTA) 2 MG TABS tablet, Take 2 mg by mouth at bedtime as needed (for sleep.). , Disp: , Rfl: 2 .  simvastatin (ZOCOR) 10 MG tablet, Take 1 tablet (10 mg total) by mouth every Monday., Disp: 15 tablet, Rfl: 11  EXAM:  VITALS per patient if applicable:  GENERAL: alert, oriented, appears well and in no acute distress  HEENT: atraumatic, conjunttiva clear, no obvious abnormalities on inspection of external nose and ears  NECK: normal movements of the head and neck  LUNGS: on inspection no signs of respiratory distress, breathing rate appears normal, no obvious gross SOB, gasping or wheezing  CV: no obvious cyanosis  MS: moves all visible extremities without noticeable abnormality  PSYCH/NEURO: pleasant  and cooperative, no obvious depression or anxiety, speech and thought processing grossly intact  ASSESSMENT AND PLAN:  Discussed the following assessment and plan:  Osteoporosis, unspecified osteoporosis type, unspecified pathological fracture presence - Plan: DG Bone Density in 1 year 11/2019  -disc prolia pt wants to wait for now  Hyperlipidemia -cont med qmonday   H/O shoulder surgery  HM Flu shot utd 06/24/19 Hadpna vaccines x 2  -consider repeat pna 23  Had zostervaxgiven Rx shingrixhad not had will check ncir  Tdaphad 12/25/17  Out of pap window s/p total hysterectomyjust saw OB/GYN 12/2017  mammo neg 12/17/18 negative   dexa 12/17/18 osteoporosis  consider prolia   Colonoscopy: 01/22/16 Dr. Rayann Heman h/o colon polpys. colonoscopy was difficult per report and she didn't like the MD who did the procedure. Reviewed +IH and adhesions making exam difficult.   cologuard done 02/03/16 repeat due 02/03/2019 until age 49 reviewed with pt ok with this planwill re ordered closer to date -+cologuard 02/08/19  -colonoscopy 03/05/19 IH otherwise neg  Saw dermatology on [place] 06/16/17 had [place] LN2 f/u in 1 year, recently saw 06/2019  H/o nmsc x 2 face and 1 neck h/o mohs [facility] Dr. Manley Mason    She is former smoker quit >15 years ago smoked 1 ppd x 35 yeas no FH lung cancer  -consider CT low dose if insurance will cover in future   Cont exercise 3x per week  -we discussed possible serious and likely etiologies, options for evaluation and workup, limitations of telemedicine visit vs in person visit, treatment, treatment risks and precautions. Pt prefers to treat via telemedicine empirically rather then risking or undertaking an in person visit at this moment. Patient agrees to seek prompt in person care if worsening, new symptoms arise, or if is not improving with treatment.   I discussed the assessment and treatment plan with the patient. The patient was provided an opportunity to ask questions and all were answered. The patient agreed with the plan and demonstrated an understanding of the instructions.   The patient was advised to call back or seek an in-person evaluation if the symptoms worsen or if the condition fails to improve as anticipated.  Time spent 15 minutes  Delorise Jackson, MD

## 2019-07-18 NOTE — Patient Instructions (Addendum)
  Ms. Cellini , Thank you for taking time to come for your Medicare Wellness Visit. I appreciate your ongoing commitment to your health goals. Please review the following plan we discussed and let me know if I can assist you in the future.   These are the goals we discussed: Goals    . Maintain Healthy Lifestyle     Stay active Stay hydrated Healthy diet       This is a list of the screening recommended for you and due dates:  Health Maintenance  Topic Date Due  . Tetanus Vaccine  12/26/2027  . Flu Shot  Completed  . DEXA scan (bone density measurement)  Completed  . Pneumonia vaccines  Completed

## 2019-07-18 NOTE — Patient Instructions (Signed)
Prolia-Denosumab injection -review information about this medication   What is this medicine? DENOSUMAB (den oh sue mab) slows bone breakdown. Prolia is used to treat osteoporosis in women after menopause and in men, and in people who are taking corticosteroids for 6 months or more. Delton See is used to treat a high calcium level due to cancer and to prevent bone fractures and other bone problems caused by multiple myeloma or cancer bone metastases. Delton See is also used to treat giant cell tumor of the bone. This medicine may be used for other purposes; ask your health care provider or pharmacist if you have questions. COMMON BRAND NAME(S): Prolia, XGEVA What should I tell my health care provider before I take this medicine? They need to know if you have any of these conditions:  dental disease  having surgery or tooth extraction  infection  kidney disease  low levels of calcium or Vitamin D in the blood  malnutrition  on hemodialysis  skin conditions or sensitivity  thyroid or parathyroid disease  an unusual reaction to denosumab, other medicines, foods, dyes, or preservatives  pregnant or trying to get pregnant  breast-feeding How should I use this medicine? This medicine is for injection under the skin. It is given by a health care professional in a hospital or clinic setting. A special MedGuide will be given to you before each treatment. Be sure to read this information carefully each time. For Prolia, talk to your pediatrician regarding the use of this medicine in children. Special care may be needed. For Delton See, talk to your pediatrician regarding the use of this medicine in children. While this drug may be prescribed for children as young as 13 years for selected conditions, precautions do apply. Overdosage: If you think you have taken too much of this medicine contact a poison control center or emergency room at once. NOTE: This medicine is only for you. Do not share this  medicine with others. What if I miss a dose? It is important not to miss your dose. Call your doctor or health care professional if you are unable to keep an appointment. What may interact with this medicine? Do not take this medicine with any of the following medications:  other medicines containing denosumab This medicine may also interact with the following medications:  medicines that lower your chance of fighting infection  steroid medicines like prednisone or cortisone This list may not describe all possible interactions. Give your health care provider a list of all the medicines, herbs, non-prescription drugs, or dietary supplements you use. Also tell them if you smoke, drink alcohol, or use illegal drugs. Some items may interact with your medicine. What should I watch for while using this medicine? Visit your doctor or health care professional for regular checks on your progress. Your doctor or health care professional may order blood tests and other tests to see how you are doing. Call your doctor or health care professional for advice if you get a fever, chills or sore throat, or other symptoms of a cold or flu. Do not treat yourself. This drug may decrease your body's ability to fight infection. Try to avoid being around people who are sick. You should make sure you get enough calcium and vitamin D while you are taking this medicine, unless your doctor tells you not to. Discuss the foods you eat and the vitamins you take with your health care professional. See your dentist regularly. Brush and floss your teeth as directed. Before you have any dental  work done, tell your dentist you are receiving this medicine. Do not become pregnant while taking this medicine or for 5 months after stopping it. Talk with your doctor or health care professional about your birth control options while taking this medicine. Women should inform their doctor if they wish to become pregnant or think they might  be pregnant. There is a potential for serious side effects to an unborn child. Talk to your health care professional or pharmacist for more information. What side effects may I notice from receiving this medicine? Side effects that you should report to your doctor or health care professional as soon as possible:  allergic reactions like skin rash, itching or hives, swelling of the face, lips, or tongue  bone pain  breathing problems  dizziness  jaw pain, especially after dental work  redness, blistering, peeling of the skin  signs and symptoms of infection like fever or chills; cough; sore throat; pain or trouble passing urine  signs of low calcium like fast heartbeat, muscle cramps or muscle pain; pain, tingling, numbness in the hands or feet; seizures  unusual bleeding or bruising  unusually weak or tired Side effects that usually do not require medical attention (report to your doctor or health care professional if they continue or are bothersome):  constipation  diarrhea  headache  joint pain  loss of appetite  muscle pain  runny nose  tiredness  upset stomach This list may not describe all possible side effects. Call your doctor for medical advice about side effects. You may report side effects to FDA at 1-800-FDA-1088. Where should I keep my medicine? This medicine is only given in a clinic, doctor's office, or other health care setting and will not be stored at home. NOTE: This sheet is a summary. It may not cover all possible information. If you have questions about this medicine, talk to your doctor, pharmacist, or health care provider.  2020 Elsevier/Gold Standard (2018-02-16 16:10:44)

## 2019-07-19 NOTE — Progress Notes (Signed)
shingrix is not in [employer]

## 2019-07-23 ENCOUNTER — Ambulatory Visit: Payer: Medicare Other | Admitting: Internal Medicine

## 2019-07-25 ENCOUNTER — Ambulatory Visit: Payer: Medicare Other

## 2019-07-25 ENCOUNTER — Ambulatory Visit: Payer: Medicare Other | Admitting: Internal Medicine

## 2019-07-26 ENCOUNTER — Encounter: Payer: Self-pay | Admitting: Internal Medicine

## 2019-09-10 ENCOUNTER — Other Ambulatory Visit: Payer: Self-pay | Admitting: Internal Medicine

## 2019-09-10 DIAGNOSIS — E785 Hyperlipidemia, unspecified: Secondary | ICD-10-CM

## 2019-09-10 MED ORDER — SIMVASTATIN 10 MG PO TABS: 10.0000 mg | ORAL_TABLET | ORAL | 11 refills | Status: DC

## 2019-09-23 DIAGNOSIS — S39012A Strain of muscle, fascia and tendon of lower back, initial encounter: Secondary | ICD-10-CM | POA: Diagnosis not present

## 2019-09-23 DIAGNOSIS — N390 Urinary tract infection, site not specified: Secondary | ICD-10-CM | POA: Diagnosis not present

## 2019-10-28 DIAGNOSIS — Z20822 Contact with and (suspected) exposure to covid-19: Secondary | ICD-10-CM | POA: Diagnosis not present

## 2019-11-04 DIAGNOSIS — S82832A Other fracture of upper and lower end of left fibula, initial encounter for closed fracture: Secondary | ICD-10-CM | POA: Insufficient documentation

## 2019-11-04 DIAGNOSIS — M25572 Pain in left ankle and joints of left foot: Secondary | ICD-10-CM | POA: Diagnosis not present

## 2019-11-04 DIAGNOSIS — M25562 Pain in left knee: Secondary | ICD-10-CM | POA: Diagnosis not present

## 2019-11-04 HISTORY — DX: Other fracture of upper and lower end of left fibula, initial encounter for closed fracture: S82.832A

## 2019-11-05 DIAGNOSIS — Z23 Encounter for immunization: Secondary | ICD-10-CM | POA: Diagnosis not present

## 2019-11-28 ENCOUNTER — Other Ambulatory Visit: Payer: Self-pay | Admitting: Internal Medicine

## 2019-11-28 DIAGNOSIS — E785 Hyperlipidemia, unspecified: Secondary | ICD-10-CM

## 2019-11-28 MED ORDER — SIMVASTATIN 10 MG PO TABS: 10.0000 mg | ORAL_TABLET | ORAL | 11 refills | Status: DC

## 2019-12-03 DIAGNOSIS — Z23 Encounter for immunization: Secondary | ICD-10-CM | POA: Diagnosis not present

## 2019-12-19 ENCOUNTER — Ambulatory Visit
Admission: RE | Admit: 2019-12-19 | Discharge: 2019-12-19 | Disposition: A | Discharge status: Home / Self Care | Payer: Medicare Other | Source: Ambulatory Visit | Attending: Internal Medicine | Admitting: Internal Medicine

## 2019-12-19 DIAGNOSIS — Z780 Asymptomatic menopausal state: Secondary | ICD-10-CM | POA: Diagnosis not present

## 2019-12-19 DIAGNOSIS — M810 Age-related osteoporosis without current pathological fracture: Secondary | ICD-10-CM | POA: Diagnosis not present

## 2019-12-19 DIAGNOSIS — M8589 Other specified disorders of bone density and structure, multiple sites: Secondary | ICD-10-CM | POA: Diagnosis not present

## 2019-12-23 DIAGNOSIS — M7582 Other shoulder lesions, left shoulder: Secondary | ICD-10-CM | POA: Diagnosis not present

## 2019-12-23 DIAGNOSIS — M25562 Pain in left knee: Secondary | ICD-10-CM | POA: Diagnosis not present

## 2020-01-15 ENCOUNTER — Other Ambulatory Visit: Payer: Self-pay

## 2020-01-15 ENCOUNTER — Encounter: Payer: Self-pay | Admitting: Internal Medicine

## 2020-01-15 ENCOUNTER — Other Ambulatory Visit: Payer: Self-pay | Admitting: Internal Medicine

## 2020-01-15 ENCOUNTER — Telehealth: Payer: Self-pay | Admitting: Internal Medicine

## 2020-01-15 ENCOUNTER — Ambulatory Visit (INDEPENDENT_AMBULATORY_CARE_PROVIDER_SITE_OTHER): Payer: Medicare Other | Admitting: Internal Medicine

## 2020-01-15 VITALS — BP 128/70 | HR 65 | Temp 97.0°F | Ht 64.0 in | Wt 133.2 lb

## 2020-01-15 DIAGNOSIS — Z1231 Encounter for screening mammogram for malignant neoplasm of breast: Secondary | ICD-10-CM

## 2020-01-15 DIAGNOSIS — M8580 Other specified disorders of bone density and structure, unspecified site: Secondary | ICD-10-CM | POA: Diagnosis not present

## 2020-01-15 DIAGNOSIS — Z1389 Encounter for screening for other disorder: Secondary | ICD-10-CM | POA: Diagnosis not present

## 2020-01-15 DIAGNOSIS — H6123 Impacted cerumen, bilateral: Secondary | ICD-10-CM

## 2020-01-15 DIAGNOSIS — E559 Vitamin D deficiency, unspecified: Secondary | ICD-10-CM | POA: Diagnosis not present

## 2020-01-15 DIAGNOSIS — R319 Hematuria, unspecified: Secondary | ICD-10-CM

## 2020-01-15 DIAGNOSIS — L570 Actinic keratosis: Secondary | ICD-10-CM | POA: Diagnosis not present

## 2020-01-15 DIAGNOSIS — Z0000 Encounter for general adult medical examination without abnormal findings: Secondary | ICD-10-CM | POA: Diagnosis not present

## 2020-01-15 DIAGNOSIS — S82832D Other fracture of upper and lower end of left fibula, subsequent encounter for closed fracture with routine healing: Secondary | ICD-10-CM | POA: Diagnosis not present

## 2020-01-15 DIAGNOSIS — E785 Hyperlipidemia, unspecified: Secondary | ICD-10-CM

## 2020-01-15 DIAGNOSIS — M810 Age-related osteoporosis without current pathological fracture: Secondary | ICD-10-CM

## 2020-01-15 DIAGNOSIS — Z1329 Encounter for screening for other suspected endocrine disorder: Secondary | ICD-10-CM | POA: Diagnosis not present

## 2020-01-15 LAB — URINALYSIS, ROUTINE W REFLEX MICROSCOPIC
Bilirubin Urine: NEGATIVE
Ketones, ur: NEGATIVE
Nitrite: NEGATIVE
Specific Gravity, Urine: <=1.005 — AB (ref 1.000–1.030)
Total Protein, Urine: NEGATIVE
Urine Glucose: NEGATIVE
Urobilinogen, UA: 0.2 (ref 0.0–1.0)
pH: 7 (ref 5.0–8.0)

## 2020-01-15 NOTE — Patient Instructions (Addendum)
Call mammogram due  Call dermatology to check left ear  Debrox ear wax drops 4-7 days 1x per month   Consider prolia  Denosumab injection What is this medicine? DENOSUMAB (den oh sue mab) slows bone breakdown. Prolia is used to treat osteoporosis in women after menopause and in men, and in people who are taking corticosteroids for 6 months or more. Delton See is used to treat a high calcium level due to cancer and to prevent bone fractures and other bone problems caused by multiple myeloma or cancer bone metastases. Delton See is also used to treat giant cell tumor of the bone. This medicine may be used for other purposes; ask your health care provider or pharmacist if you have questions. COMMON BRAND NAME(S): Prolia, XGEVA What should I tell my health care provider before I take this medicine? They need to know if you have any of these conditions:  dental disease  having surgery or tooth extraction  infection  kidney disease  low levels of calcium or Vitamin D in the blood  malnutrition  on hemodialysis  skin conditions or sensitivity  thyroid or parathyroid disease  an unusual reaction to denosumab, other medicines, foods, dyes, or preservatives  pregnant or trying to get pregnant  breast-feeding How should I use this medicine? This medicine is for injection under the skin. It is given by a health care professional in a hospital or clinic setting. A special MedGuide will be given to you before each treatment. Be sure to read this information carefully each time. For Prolia, talk to your pediatrician regarding the use of this medicine in children. Special care may be needed. For Delton See, talk to your pediatrician regarding the use of this medicine in children. While this drug may be prescribed for children as young as 13 years for selected conditions, precautions do apply. Overdosage: If you think you have taken too much of this medicine contact a poison control center or emergency room  at once. NOTE: This medicine is only for you. Do not share this medicine with others. What if I miss a dose? It is important not to miss your dose. Call your doctor or health care professional if you are unable to keep an appointment. What may interact with this medicine? Do not take this medicine with any of the following medications:  other medicines containing denosumab This medicine may also interact with the following medications:  medicines that lower your chance of fighting infection  steroid medicines like prednisone or cortisone This list may not describe all possible interactions. Give your health care provider a list of all the medicines, herbs, non-prescription drugs, or dietary supplements you use. Also tell them if you smoke, drink alcohol, or use illegal drugs. Some items may interact with your medicine. What should I watch for while using this medicine? Visit your doctor or health care professional for regular checks on your progress. Your doctor or health care professional may order blood tests and other tests to see how you are doing. Call your doctor or health care professional for advice if you get a fever, chills or sore throat, or other symptoms of a cold or flu. Do not treat yourself. This drug may decrease your body's ability to fight infection. Try to avoid being around people who are sick. You should make sure you get enough calcium and vitamin D while you are taking this medicine, unless your doctor tells you not to. Discuss the foods you eat and the vitamins you take with your health  care professional. See your dentist regularly. Brush and floss your teeth as directed. Before you have any dental work done, tell your dentist you are receiving this medicine. Do not become pregnant while taking this medicine or for 5 months after stopping it. Talk with your doctor or health care professional about your birth control options while taking this medicine. Women should inform  their doctor if they wish to become pregnant or think they might be pregnant. There is a potential for serious side effects to an unborn child. Talk to your health care professional or pharmacist for more information. What side effects may I notice from receiving this medicine? Side effects that you should report to your doctor or health care professional as soon as possible:  allergic reactions like skin rash, itching or hives, swelling of the face, lips, or tongue  bone pain  breathing problems  dizziness  jaw pain, especially after dental work  redness, blistering, peeling of the skin  signs and symptoms of infection like fever or chills; cough; sore throat; pain or trouble passing urine  signs of low calcium like fast heartbeat, muscle cramps or muscle pain; pain, tingling, numbness in the hands or feet; seizures  unusual bleeding or bruising  unusually weak or tired Side effects that usually do not require medical attention (report to your doctor or health care professional if they continue or are bothersome):  constipation  diarrhea  headache  joint pain  loss of appetite  muscle pain  runny nose  tiredness  upset stomach This list may not describe all possible side effects. Call your doctor for medical advice about side effects. You may report side effects to FDA at 1-800-FDA-1088. Where should I keep my medicine? This medicine is only given in a clinic, doctor's office, or other health care setting and will not be stored at home. NOTE: This sheet is a summary. It may not cover all possible information. If you have questions about this medicine, talk to your doctor, pharmacist, or health care provider.  2020 Elsevier/Gold Standard (2018-02-16 16:10:44)

## 2020-01-15 NOTE — Progress Notes (Addendum)
Chief Complaint  Patient presents with  . Follow-up   F/u  1. Broken fibula 11/04/19 after missed her step f/u Dr. Roland Rack still with residual left ankle swelling she did RICE but lower leg doing better and she will try to be more active no pain  2. Osteoporosis/penia T score -2.3 with high Frax score and #1 agreeable to prolia  3. Ear wax in b/l ears R>L lavaged today  4. H/o nmsc face due to see dermatology fall 20201 will see if she can see sooner rough lesion on left helix 5. HLD on zocor 10 mg Q monday  Review of Systems  Constitutional: Negative for weight loss.  HENT:       +ear wax   Eyes: Negative for blurred vision.  Respiratory: Negative for shortness of breath.   Cardiovascular: Negative for chest pain.  Gastrointestinal: Negative for abdominal pain.  Musculoskeletal: Negative for joint pain.  Skin: Negative for rash.  Psychiatric/Behavioral: Positive for memory loss.   Past Medical History:  Diagnosis Date  . Allergic rhinitis due to pollen   . Blood present in stool   . 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 Endometriosis   . Environmental and seasonal allergies   . GERD (gastroesophageal reflux disease)   . Hyperlipidemia   . Osteopenia 11/12   T -1.8 in hip  . Sleep disturbance   . UTI (urinary tract infection)   . Vaginal atrophy    Past Surgical History:  Procedure Laterality Date  . ABDOMINAL HYSTERECTOMY     total   . APPENDECTOMY    . CATARACT EXTRACTION W/ INTRAOCULAR LENS  IMPLANT, BILATERAL    . COLONOSCOPY WITH PROPOFOL N/A 01/22/2016   Procedure: COLONOSCOPY WITH PROPOFOL;  Surgeon: Josefine Class, MD;  Location: [facility] ENDOSCOPY;  Service: Endoscopy;  Laterality: N/A;  . ROTATOR CUFF REPAIR Right ~2001  . SHOULDER ARTHROSCOPY WITH SUBACROMIAL DECOMPRESSION AND BICEP TENDON REPAIR Left 11/08/2018   Procedure: SHOULDER ARTHROSCOPY WITH DEBRIDEMENT, DECOMPRESSION AND ROTATOR CUFF  REPAIR;  Surgeon: Corky Mull, MD;  Location: [facility] ORS;  Service: Orthopedics;  Laterality: Left;   Family History  Problem Relation Age of Onset  . Cancer Mother        CLL  . Cancer Father        colon  . Aneurysm Father   . Cancer Brother        colon cancer  . Heart disease Maternal Uncle   . Diabetes Neg Hx   . Breast cancer Neg Hx   . Bladder Cancer Neg Hx   . Kidney cancer Neg Hx    Social History   Socioeconomic History  . Marital status: Married    Spouse name: Kara Pacer  . Number of children: 0  . Years of education: Not on file  . Highest education level: Not on file  Occupational History  . Occupation: Scientist, water quality    Comment: Retired  Tobacco Use  . Smoking status: Former Smoker    Quit date: 10/30/1966    Years since quitting: 53.2  . Smokeless tobacco: Never Used  . Tobacco comment: as of 08/2017 quit smoking >15 years ago 1ppd x 35 years no FH lung cancer   Substance and Sexual Activity  . Alcohol use: Yes    Alcohol/week: 2.0 standard drinks    Types: 2 Shots of liquor per week    Comment: drinks  gin per week  . Drug use: No  . Sexual activity: Never  Other Topics Concern  . Not on file  Social History Narrative   Has living will   Husband is health care POA.   Would accept resuscitation but no prolonged ventilation   Not sure about tube feeds   Moved her from [place]    Exercises 3x per week    No kids    Social Determinants of Health   Financial Resource Strain:   . Difficulty of Paying Living Expenses:   Food Insecurity:   . Worried About Charity fundraiser in the Last Year:   . Arboriculturist in the Last Year:   Transportation Needs:   . Film/video editor (Medical):   Marland Kitchen Lack of Transportation (Non-Medical):   Physical Activity:   . Days of Exercise per Week:   . Minutes of Exercise per Session:   Stress:   . Feeling of Stress :   Social Connections:   . Frequency of Communication with Friends and  Family:   . Frequency of Social Gatherings with Friends and Family:   . Attends Religious Services:   . Active Member of Clubs or Organizations:   . Attends Archivist Meetings:   Marland Kitchen Marital Status:   Intimate Partner Violence:   . Fear of Current or Ex-Partner:   . Emotionally Abused:   Marland Kitchen Physically Abused:   . Sexually Abused:    Current Meds  Medication Sig  . eszopiclone (LUNESTA) 2 MG TABS tablet Take 2 mg by mouth at bedtime as needed (for sleep.).    Allergies  Allergen Reactions  . Contrast Media [Iodinated Diagnostic Agents] Anaphylaxis   Recent Results (from the past 2160 hour(s))  Urinalysis, Routine w reflex microscopic     Status: Abnormal   Collection Time: 01/15/20 11:02 AM  Result Value Ref Range   Color, Urine YELLOW Yellow;Lt. Yellow;Straw;Dark Yellow;Amber;Green;Red;Brown   APPearance CLEAR Clear;Turbid;Slightly Cloudy;Cloudy   Specific Gravity, Urine <=1.005 (A) 1.000 - 1.030   pH 7.0 5.0 - 8.0   Total Protein, Urine NEGATIVE Negative   Urine Glucose NEGATIVE Negative   Ketones, ur NEGATIVE Negative   Bilirubin Urine NEGATIVE Negative   Hgb urine dipstick TRACE-INTACT (A) Negative   Urobilinogen, UA 0.2 0.0 - 1.0   Leukocytes,Ua TRACE (A) Negative   Nitrite NEGATIVE Negative   WBC, UA 0-2/hpf 0-2/hpf   RBC / HPF 0-2/hpf 0-2/hpf   Squamous Epithelial / LPF Rare(0-4/hpf) Rare(0-4/hpf)   Objective  Body mass index is 22.86 kg/m. Wt Readings from Last 3 Encounters:  01/15/20 133 lb 3.2 oz (60.4 kg)  03/05/19 130 lb (59 kg)  02/20/19 130 lb (59 kg)   Temp Readings from Last 3 Encounters:  01/15/20 (!) 97 F (36.1 C) (Temporal)  03/05/19 98.5 F (36.9 C)  01/08/19 98.3 F (36.8 C) (Oral)   BP Readings from Last 3 Encounters:  01/15/20 128/70  03/05/19 (!) 153/66  02/19/19 (!) 130/58   Pulse Readings from Last 3 Encounters:  01/15/20 65  03/05/19 (!) 56  02/19/19 80    Physical Exam Vitals and nursing note reviewed.    Constitutional:      Appearance: Normal appearance. She is well-developed and well-groomed.  HENT:     Head: Normocephalic and atraumatic.      Comments: Cerumen impaction b/l ears R>L     Right Ear: There is impacted cerumen.     Left Ear: There is impacted cerumen.  Eyes:     Conjunctiva/sclera: Conjunctivae normal.     Pupils: Pupils are equal, round, and reactive to light.  Cardiovascular:     Rate and Rhythm: Normal rate and regular rhythm.     Heart sounds: Normal heart sounds. No murmur.  Pulmonary:     Effort: Pulmonary effort is normal.     Breath sounds: Normal breath sounds.  Abdominal:     General: Abdomen is flat. Bowel sounds are normal.  Musculoskeletal:     Comments: Mild left ankle edema s/p fall 10/2019 and fx fibula  Skin:    General: Skin is warm and dry.  Neurological:     General: No focal deficit present.     Mental Status: She is alert and oriented to person, place, and time. Mental status is at baseline.     Gait: Gait normal.  Psychiatric:        Attention and Perception: Attention and perception normal.        Mood and Affect: Mood and affect normal.        Speech: Speech normal.        Behavior: Behavior normal. Behavior is cooperative.        Thought Content: Thought content normal.        Cognition and Memory: Cognition and memory normal.        Judgment: Judgment normal.     Assessment  Plan  Closed fracture of distal end of left fibula with routine healing, unspecified fracture morphology, subsequent encounter F/u Dr. Roland Rack prn    Hyperlipidemia, unspecified hyperlipidemia type -  Cont zocor Qmonday   Osteoporosis, unspecified osteoporosis type, unspecified pathological fracture presence - Plan: CBC with Differential/Platelet Osteopenia, unspecified location - Plan: CBC with Differential/Platelet -agreeable to prolia will try to approve with insurance   Actinic keratosis left helix  rec f/u dermatology   Bilateral impacted cerumen   Lavage right ear and currette leff ear pt tolerated and all wax removed  rec debrox ear wax drops   HM Flu shot utd 06/24/19 Hadpna vaccines x 2  -pna 23  utd no need for futher with repeat labs 01/2020  Had zostervax -given Rx shingrixhad not had will check ncir  Tdaphad 12/25/17 covid vx had 2/2 pt to bring in log of this   Out of pap window s/p total hysterectomyjust saw OB/GYN 12/2017  mammo neg 12/17/18 negative call to schedule sch 02/03/20   dexa 11/2019 osteopenia high FRAX score - consider proliadisc today pt agreeable will approve  Colonoscopy: 01/22/16 Dr. Rayann Heman h/o colon polpys. colonoscopy was difficult per report and she didn't like the MD who did the procedure. Reviewed +IH and adhesions making exam difficult.   cologuard done 02/03/16 repeat due 02/03/2019 until age 33 reviewed with pt ok with this planwill re ordered closer to date -+cologuard 02/08/19  -colonoscopy 03/05/19 IH otherwise neg  Saw dermatology on [place] 06/16/17 had [place] LN2 f/u in 1 year, recently saw 06/2019  H/o nmsc x 2 face and 1 neck h/o mohs [facility] Dr. Manley Mason  dermatology appt tbd 06/2020 call and sch before due to above  She is former smoker quit >15 years ago smoked 1 ppd x 35 yeas no FH lung cancer  -consider CT low dose if insurance will cover in future   Cont exercise 3x per week   Provider: Dr. Olivia Mackie McLean-Scocuzza-Internal Medicine

## 2020-01-15 NOTE — Telephone Encounter (Signed)
Disc prolia pt wants to try to get this approved   Thanks [place]

## 2020-01-15 NOTE — Telephone Encounter (Signed)
Pt brought in article for Dr [facility] that they discussed at appt. In colored folder up front

## 2020-01-16 DIAGNOSIS — X32XXXA Exposure to sunlight, initial encounter: Secondary | ICD-10-CM | POA: Diagnosis not present

## 2020-01-16 DIAGNOSIS — L570 Actinic keratosis: Secondary | ICD-10-CM | POA: Diagnosis not present

## 2020-01-16 NOTE — Telephone Encounter (Signed)
Placed on your desk. 

## 2020-01-20 NOTE — Telephone Encounter (Signed)
Patient has bneen submitted to Prolia for approval #  [id number]

## 2020-01-21 NOTE — Telephone Encounter (Signed)
Patient has been approved at no [facility] to patient per [employer] ok to schedule?

## 2020-01-21 NOTE — Telephone Encounter (Signed)
Ok to schedule prolia  now and every 6 months

## 2020-01-29 ENCOUNTER — Other Ambulatory Visit (INDEPENDENT_AMBULATORY_CARE_PROVIDER_SITE_OTHER): Payer: Medicare Other

## 2020-01-29 ENCOUNTER — Ambulatory Visit (INDEPENDENT_AMBULATORY_CARE_PROVIDER_SITE_OTHER): Payer: Medicare Other | Admitting: *Deleted

## 2020-01-29 ENCOUNTER — Other Ambulatory Visit: Payer: Self-pay

## 2020-01-29 DIAGNOSIS — E785 Hyperlipidemia, unspecified: Secondary | ICD-10-CM

## 2020-01-29 DIAGNOSIS — R319 Hematuria, unspecified: Secondary | ICD-10-CM | POA: Diagnosis not present

## 2020-01-29 DIAGNOSIS — Z1329 Encounter for screening for other suspected endocrine disorder: Secondary | ICD-10-CM

## 2020-01-29 DIAGNOSIS — Z0000 Encounter for general adult medical examination without abnormal findings: Secondary | ICD-10-CM

## 2020-01-29 DIAGNOSIS — E559 Vitamin D deficiency, unspecified: Secondary | ICD-10-CM | POA: Diagnosis not present

## 2020-01-29 DIAGNOSIS — M8580 Other specified disorders of bone density and structure, unspecified site: Secondary | ICD-10-CM

## 2020-01-29 DIAGNOSIS — M810 Age-related osteoporosis without current pathological fracture: Secondary | ICD-10-CM

## 2020-01-29 LAB — COMPREHENSIVE METABOLIC PANEL
ALT: 9 U/L (ref 0–35)
AST: 19 U/L (ref 0–37)
Albumin: 4.1 g/dL (ref 3.5–5.2)
Alkaline Phosphatase: 57 U/L (ref 39–117)
BUN: 16 mg/dL (ref 6–23)
CO2: 29 mEq/L (ref 19–32)
Calcium: 9.2 mg/dL (ref 8.4–10.5)
Chloride: 108 mEq/L (ref 96–112)
Creatinine, Ser: 0.77 mg/dL (ref 0.40–1.20)
GFR: 71.83 mL/min (ref >60.00)
Glucose, Bld: 98 mg/dL (ref 70–99)
Potassium: 4 mEq/L (ref 3.5–5.1)
Sodium: 142 mEq/L (ref 135–145)
Total Bilirubin: 0.5 mg/dL (ref 0.2–1.2)
Total Protein: 6.5 g/dL (ref 6.0–8.3)

## 2020-01-29 LAB — CBC WITH DIFFERENTIAL/PLATELET
Basophils Absolute: 0 10*3/uL (ref 0.0–0.1)
Basophils Relative: 0.8 % (ref 0.0–3.0)
Eosinophils Absolute: 0.2 10*3/uL (ref 0.0–0.7)
Eosinophils Relative: 4.2 % (ref 0.0–5.0)
HCT: 42.4 % (ref 36.0–46.0)
Hemoglobin: 14.3 g/dL (ref 12.0–15.0)
Lymphocytes Relative: 19.9 % (ref 12.0–46.0)
Lymphs Abs: 1.2 10*3/uL (ref 0.7–4.0)
MCHC: 33.7 g/dL (ref 30.0–36.0)
MCV: 92.2 fl (ref 78.0–100.0)
Monocytes Absolute: 0.5 10*3/uL (ref 0.1–1.0)
Monocytes Relative: 8.5 % (ref 3.0–12.0)
Neutro Abs: 3.9 10*3/uL (ref 1.4–7.7)
Neutrophils Relative %: 66.6 % (ref 43.0–77.0)
Platelets: 171 10*3/uL (ref 150.0–400.0)
RBC: 4.59 Mil/uL (ref 3.87–5.11)
RDW: 13.2 % (ref 11.5–15.5)
WBC: 5.8 10*3/uL (ref 4.0–10.5)

## 2020-01-29 LAB — LIPID PANEL
Cholesterol: 161 mg/dL (ref 0–200)
HDL: 59.1 mg/dL (ref >39.00)
LDL Cholesterol: 91 mg/dL (ref 0–99)
NonHDL: 101.88
Total CHOL/HDL Ratio: 3
Triglycerides: 52 mg/dL (ref 0.0–149.0)
VLDL: 10.4 mg/dL (ref 0.0–40.0)

## 2020-01-29 LAB — TSH: TSH: 2.08 u[IU]/mL (ref 0.35–4.50)

## 2020-01-29 LAB — VITAMIN D 25 HYDROXY (VIT D DEFICIENCY, FRACTURES): VITD: 48.14 ng/mL (ref 30.00–100.00)

## 2020-01-29 MED ORDER — DENOSUMAB 60 MG/ML ~~LOC~~ SOSY
60.0000 mg | PREFILLED_SYRINGE | Freq: Once | SUBCUTANEOUS | Status: AC
  Administered 2020-01-29: 60 mg via SUBCUTANEOUS

## 2020-01-29 NOTE — Progress Notes (Signed)
Patient presented for Prolia injection to left arm [place], patient voiced no concerns or complaints during or after injection. 

## 2020-01-30 LAB — URINE CULTURE
MICRO NUMBER:: 10336960
SPECIMEN QUALITY:: ADEQUATE

## 2020-02-03 ENCOUNTER — Ambulatory Visit
Admission: RE | Admit: 2020-02-03 | Discharge: 2020-02-03 | Disposition: A | Discharge status: Home / Self Care | Payer: Medicare Other | Source: Ambulatory Visit | Attending: Internal Medicine | Admitting: Internal Medicine

## 2020-02-03 DIAGNOSIS — Z1231 Encounter for screening mammogram for malignant neoplasm of breast: Secondary | ICD-10-CM | POA: Diagnosis not present

## 2020-03-17 DIAGNOSIS — S82832D Other fracture of upper and lower end of left fibula, subsequent encounter for closed fracture with routine healing: Secondary | ICD-10-CM | POA: Diagnosis not present

## 2020-03-17 DIAGNOSIS — M25562 Pain in left knee: Secondary | ICD-10-CM | POA: Diagnosis not present

## 2020-06-04 DIAGNOSIS — Z20822 Contact with and (suspected) exposure to covid-19: Secondary | ICD-10-CM | POA: Diagnosis not present

## 2020-07-14 ENCOUNTER — Encounter: Payer: Self-pay | Admitting: Internal Medicine

## 2020-07-14 ENCOUNTER — Other Ambulatory Visit: Payer: Self-pay

## 2020-07-14 ENCOUNTER — Ambulatory Visit (INDEPENDENT_AMBULATORY_CARE_PROVIDER_SITE_OTHER): Payer: Medicare Other | Admitting: Internal Medicine

## 2020-07-14 VITALS — BP 112/62 | HR 77 | Temp 98.0°F | Ht 64.0 in | Wt 129.6 lb

## 2020-07-14 DIAGNOSIS — Z85828 Personal history of other malignant neoplasm of skin: Secondary | ICD-10-CM | POA: Diagnosis not present

## 2020-07-14 DIAGNOSIS — L720 Epidermal cyst: Secondary | ICD-10-CM | POA: Diagnosis not present

## 2020-07-14 DIAGNOSIS — Z1231 Encounter for screening mammogram for malignant neoplasm of breast: Secondary | ICD-10-CM

## 2020-07-14 DIAGNOSIS — L821 Other seborrheic keratosis: Secondary | ICD-10-CM | POA: Diagnosis not present

## 2020-07-14 DIAGNOSIS — Z0000 Encounter for general adult medical examination without abnormal findings: Secondary | ICD-10-CM | POA: Diagnosis not present

## 2020-07-14 DIAGNOSIS — E785 Hyperlipidemia, unspecified: Secondary | ICD-10-CM | POA: Diagnosis not present

## 2020-07-14 DIAGNOSIS — H9193 Unspecified hearing loss, bilateral: Secondary | ICD-10-CM | POA: Diagnosis not present

## 2020-07-14 DIAGNOSIS — Z1329 Encounter for screening for other suspected endocrine disorder: Secondary | ICD-10-CM | POA: Diagnosis not present

## 2020-07-14 DIAGNOSIS — G4700 Insomnia, unspecified: Secondary | ICD-10-CM | POA: Diagnosis not present

## 2020-07-14 DIAGNOSIS — D2272 Melanocytic nevi of left lower limb, including hip: Secondary | ICD-10-CM | POA: Diagnosis not present

## 2020-07-14 DIAGNOSIS — D2261 Melanocytic nevi of right upper limb, including shoulder: Secondary | ICD-10-CM | POA: Diagnosis not present

## 2020-07-14 DIAGNOSIS — M810 Age-related osteoporosis without current pathological fracture: Secondary | ICD-10-CM | POA: Diagnosis not present

## 2020-07-14 DIAGNOSIS — N3000 Acute cystitis without hematuria: Secondary | ICD-10-CM

## 2020-07-14 DIAGNOSIS — D2262 Melanocytic nevi of left upper limb, including shoulder: Secondary | ICD-10-CM | POA: Diagnosis not present

## 2020-07-14 DIAGNOSIS — Z23 Encounter for immunization: Secondary | ICD-10-CM

## 2020-07-14 DIAGNOSIS — E538 Deficiency of other specified B group vitamins: Secondary | ICD-10-CM | POA: Diagnosis not present

## 2020-07-14 DIAGNOSIS — D225 Melanocytic nevi of trunk: Secondary | ICD-10-CM | POA: Diagnosis not present

## 2020-07-14 MED ORDER — ESZOPICLONE 2 MG PO TABS: 2.0000 mg | ORAL_TABLET | Freq: Every evening | ORAL | 5 refills | Status: DC | PRN

## 2020-07-14 MED ORDER — SIMVASTATIN 10 MG PO TABS: 10.0000 mg | ORAL_TABLET | ORAL | 11 refills | Status: DC

## 2020-07-14 NOTE — Progress Notes (Signed)
Chief Complaint  Patient presents with  . Follow-up   F/u  1. Insomnia wants refill lunesta 2 mg qhs  2. Hearing loss x years wants referral for hearing test    Review of Systems  Constitutional: Negative for weight loss.  HENT: Positive for hearing loss.   Eyes: Negative for blurred vision.  Respiratory: Negative for sputum production.   Cardiovascular: Negative for chest pain.  Gastrointestinal: Negative for abdominal pain.  Musculoskeletal: Negative for falls.  Skin: Negative for rash.  Psychiatric/Behavioral: Negative for depression.   Past Medical History:  Diagnosis Date  . Allergic rhinitis due to pollen   . Blood present in stool   . Cancer ([place])    bcc forehead s/p Mohs Dr. Roel Cluck (of note nmsc x 2 face and 1x neck)   . Endometriosis   . Environmental and seasonal allergies   . GERD (gastroesophageal reflux disease)   . Hyperlipidemia   . Osteopenia 11/12   T -1.8 in hip  . Osteoporosis   . Sleep disturbance   . UTI (urinary tract infection)   . Vaginal atrophy    Past Surgical History:  Procedure Laterality Date  . ABDOMINAL HYSTERECTOMY     total   . APPENDECTOMY    . CATARACT EXTRACTION W/ INTRAOCULAR LENS  IMPLANT, BILATERAL    . COLONOSCOPY WITH PROPOFOL N/A 01/22/2016   Procedure: COLONOSCOPY WITH PROPOFOL;  Surgeon: Josefine Class, MD;  Location: [facility] ENDOSCOPY;  Service: Endoscopy;  Laterality: N/A;  . ROTATOR CUFF REPAIR Right ~2001  . SHOULDER ARTHROSCOPY WITH SUBACROMIAL DECOMPRESSION AND BICEP TENDON REPAIR Left 11/08/2018   Procedure: SHOULDER ARTHROSCOPY WITH DEBRIDEMENT, DECOMPRESSION AND ROTATOR CUFF REPAIR;  Surgeon: Corky Mull, MD;  Location: [facility] ORS;  Service: Orthopedics;  Laterality: Left;   Family History  Problem Relation Age of Onset  . Cancer Mother        CLL  . Cancer Father        colon  . Aneurysm Father   . Cancer Brother        colon cancer  . Heart disease Maternal Uncle   . Diabetes Neg Hx   .  Breast cancer Neg Hx   . Bladder Cancer Neg Hx   . Kidney cancer Neg Hx    Social History   Socioeconomic History  . Marital status: Married    Spouse name: Kara Pacer  . Number of children: 0  . Years of education: Not on file  . Highest education level: Not on file  Occupational History  . Occupation: Scientist, water quality    Comment: Retired  Tobacco Use  . Smoking status: Former Smoker    Quit date: 10/30/1966    Years since quitting: 53.7  . Smokeless tobacco: Never Used  . Tobacco comment: as of 08/2017 quit smoking >15 years ago 1ppd x 35 years no FH lung cancer   Vaping Use  . Vaping Use: Never used  Substance and Sexual Activity  . Alcohol use: Yes    Alcohol/week: 2.0 standard drinks    Types: 2 Shots of liquor per week    Comment: drinks gin per week  . Drug use: No  . Sexual activity: Never  Other Topics Concern  . Not on file  Social History Narrative   Has living will   Husband is health care POA.   Would accept resuscitation but no prolonged ventilation   Not sure about tube feeds   Moved her from [place]  3x per week    No kids    Social Determinants of Health   Financial Resource Strain:   . Difficulty of Paying Living Expenses: Not on file  Food Insecurity:   . Worried About Charity fundraiser in the Last Year: Not on file  . Ran Out of Food in the Last Year: Not on file  Transportation Needs:   . Lack of Transportation (Medical): Not on file  . Lack of Transportation (Non-Medical): Not on file  Physical Activity:   . Days of Exercise per Week: Not on file  . Minutes of Exercise per Session: Not on file  Stress:   . Feeling of Stress : Not on file  Social Connections:   . Frequency of Communication with Friends and Family: Not on file  . Frequency of Social Gatherings with Friends and Family: Not on file  . Attends Religious Services: Not on file  . Active Member of Clubs or Organizations: Not on file  . Attends  Archivist Meetings: Not on file  . Marital Status: Not on file  Intimate Partner Violence:   . Fear of Current or Ex-Partner: Not on file  . Emotionally Abused: Not on file  . Physically Abused: Not on file  . Sexually Abused: Not on file   Current Meds  Medication Sig  . eszopiclone (LUNESTA) 2 MG TABS tablet Take 1 tablet (2 mg total) by mouth at bedtime as needed (for sleep.).  Derrill Memo ON 07/20/2020] simvastatin (ZOCOR) 10 MG tablet Take 1 tablet (10 mg total) by mouth every Monday.  . [DISCONTINUED] eszopiclone (LUNESTA) 2 MG TABS tablet Take 2 mg by mouth at bedtime as needed (for sleep.).   . [DISCONTINUED] simvastatin (ZOCOR) 10 MG tablet Take 1 tablet (10 mg total) by mouth every Monday.   Allergies  Allergen Reactions  . Contrast Media [Iodinated Diagnostic Agents] Anaphylaxis   No results found for this or any previous visit (from the past 2160 hour(s)). Objective  Body mass index is 22.25 kg/m. Wt Readings from Last 3 Encounters:  07/14/20 129 lb 9.6 oz (58.8 kg)  01/15/20 133 lb 3.2 oz (60.4 kg)  03/05/19 130 lb (59 kg)   Temp Readings from Last 3 Encounters:  07/14/20 98 F (36.7 C) (Oral)  01/15/20 (!) 97 F (36.1 C) (Temporal)  03/05/19 98.5 F (36.9 C)   BP Readings from Last 3 Encounters:  07/14/20 112/62  01/15/20 128/70  03/05/19 (!) 153/66   Pulse Readings from Last 3 Encounters:  07/14/20 77  01/15/20 65  03/05/19 (!) 56    Physical Exam Vitals and nursing note reviewed.  Constitutional:      Appearance: Normal appearance. She is well-developed and well-groomed.  HENT:     Head: Normocephalic and atraumatic.  Cardiovascular:     Rate and Rhythm: Normal rate and regular rhythm.     Heart sounds: Normal heart sounds. No murmur heard.   Pulmonary:     Effort: Pulmonary effort is normal.     Breath sounds: Normal breath sounds.  Skin:    General: Skin is warm and dry.  Neurological:     General: No focal deficit present.      Mental Status: She is alert and oriented to person, place, and time. Mental status is at baseline.     Gait: Gait normal.  Psychiatric:        Attention and Perception: Attention and perception normal.        Mood and  Affect: Mood and affect normal.        Speech: Speech normal.        Behavior: Behavior normal. Behavior is cooperative.        Thought Content: Thought content normal.        Cognition and Memory: Cognition and memory normal.        Judgment: Judgment normal.     Assessment  Plan  Bilateral hearing loss, unspecified hearing loss type - Plan: Ambulatory referral to Audiology  Hyperlipidemia, unspecified hyperlipidemia type - Plan: simvastatin (ZOCOR) 10 MG tablet  Insomnia, unspecified type - Plan: eszopiclone (LUNESTA) 2 MG TABS tablet  Screening mammogram, encounter for - Plan: MM 3D SCREEN BREAST BILATERAL  Osteoporosis, unspecified osteoporosis type, unspecified pathological fracture presence - Plan: DG Bone Density  Needs flu shot - Plan: Flu Vaccine QUAD High Dose(Fluad)   HM Fasting labs 07/30/20  Flu shot utd8/31/20 given today Hadpna vaccines x 2 -pna 23  utd no need for futher Had zostervax -given Rx shingrixhad not hadwill check ncir Tdaphad 12/25/17 covid vx had 2/2 pt to bring in log of this   Out of pap window s/p total hysterectomyjust saw OB/GYN 12/2017  mammo neg 02/03/20 negative ordered   dexa 11/2019 osteopenia high FRAX score - 01/29/20 proliaordered  Colonoscopy: 01/22/16 Dr. Rayann Heman h/o colon polpys. colonoscopy was difficult per report and she didn't like the MD who did the procedure. Reviewed +IH and adhesions making exam difficult.   cologuard done 02/03/16 repeat due 02/03/2019 until age 33 reviewed with pt ok with this planwill re ordered closer to date -+cologuard4/17/20 -colonoscopy 03/05/19 IH otherwise neg  Saw dermatology on [place] 06/16/17 had [place] LN2 f/u in 1 year, recentlysaw 06/2019  H/o nmsc x 2 face and  1 neck h/o mohs [facility] Dr. Manley Mason dermatology appt 07/13/20 had Hassan Rowan wnl f/u in 1 year  She is former smoker quit >15 years ago smoked 1 ppd x 35 yeas no FH lung cancer  -consider CT low dose if insurance will cover in future   Cont exercise 3x per week   Provider: Dr. Olivia Mackie McLean-Scocuzza-Internal Medicine

## 2020-07-14 NOTE — Patient Instructions (Addendum)
Mammogram and bone density 02/02/21  07/30/20 prolia shot with fasting labs 07/30/20, 6 months  Call to schedule an eye exam

## 2020-07-15 ENCOUNTER — Telehealth: Payer: Self-pay | Admitting: Internal Medicine

## 2020-07-15 NOTE — Telephone Encounter (Signed)
Pt called back and is scheduled on 07/30/20. Pt also has lab appointment this day.

## 2020-07-15 NOTE — Telephone Encounter (Signed)
Patient Prolia is approved ready to schedule on or after 07/30/20 please schedule Nurse visit for Prolia injection on or after 07/30/20

## 2020-07-17 ENCOUNTER — Ambulatory Visit: Payer: Medicare Other | Admitting: Internal Medicine

## 2020-07-20 ENCOUNTER — Ambulatory Visit (INDEPENDENT_AMBULATORY_CARE_PROVIDER_SITE_OTHER): Payer: Medicare Other

## 2020-07-20 VITALS — Ht 64.0 in | Wt 129.0 lb

## 2020-07-20 DIAGNOSIS — Z0000 Encounter for general adult medical examination without abnormal findings: Secondary | ICD-10-CM

## 2020-07-20 NOTE — Patient Instructions (Addendum)
[name] , Thank you for taking time to come for your Medicare Wellness Visit. I appreciate your ongoing commitment to your health goals. Please review the following plan we discussed and let me know if I can assist you in the future.   These are the goals we discussed: Goals      Patient Stated   .  Increase physical activity (pt-stated)      I would like to walk more for exercise.        This is a list of the screening recommended for you and due dates:  Health Maintenance  Topic Date Due  . COVID-19 Vaccine (1) 08/05/2020*  . Tetanus Vaccine  12/26/2027  . Flu Shot  Completed  . DEXA scan (bone density measurement)  Completed  . Pneumonia vaccines  Completed  *Topic was postponed. The date shown is not the original due date.    Immunizations Immunization History  Administered Date(s) Administered  . Fluad Quad(high Dose 65+) 06/24/2019, 07/14/2020  . Influenza Split 08/02/2013  . Influenza,inj,Quad PF,6+ Mos 07/08/2014, 07/13/2015, 06/29/2016, 07/10/2017, 07/16/2018  . Pneumococcal Conjugate-13 09/11/2014  . Pneumococcal Polysaccharide-23 09/10/2013  . Tdap 12/25/2017  . Zoster 09/11/2014   Keep all routine maintenance appointments.   Next scheduled lab 07/30/20 prolia; fasting labs  Follow up 01/14/20   Advanced directives: End of life planning; Advance aging; Advanced directives discussed.  Copy of current HCPOA/Living Will requested.    Conditions/risks identified: none new.   Follow up in one year for your annual wellness visit.   Preventive Care 41 Years and Older, Female Preventive care refers to lifestyle choices and visits with your health care provider that can promote health and wellness. What does preventive care include?  A yearly physical exam. This is also called an annual well check.  Dental exams once or twice a year.  Routine eye exams. Ask your health care provider how often you should have your eyes checked.  Personal lifestyle choices,  including:  Daily care of your teeth and gums.  Regular physical activity.  Eating a healthy diet.  Avoiding tobacco and drug use.  Limiting alcohol use.  Practicing safe sex.  Taking low-dose aspirin every day.  Taking vitamin and mineral supplements as recommended by your health care provider. What happens during an annual well check? The services and screenings done by your health care provider during your annual well check will depend on your age, overall health, lifestyle risk factors, and family history of disease. Counseling  Your health care provider may ask you questions about your:  Alcohol use.  Tobacco use.  Drug use.  Emotional well-being.  Home and relationship well-being.  Sexual activity.  Eating habits.  History of falls.  Memory and ability to understand (cognition).  Work and work Statistician.  Reproductive health. Screening  You may have the following tests or measurements:  Height, weight, and BMI.  Blood pressure.  Lipid and cholesterol levels. These may be checked every 5 years, or more frequently if you are over 90 years old.  Skin check.  Lung cancer screening. You may have this screening every year starting at age 22 if you have a 30-pack-year history of smoking and currently smoke or have quit within the past 15 years.  Fecal occult blood test (FOBT) of the stool. You may have this test every year starting at age 38.  Flexible sigmoidoscopy or colonoscopy. You may have a sigmoidoscopy every 5 years or a colonoscopy every 10 years starting at age  50.  Hepatitis C blood test.  Hepatitis B blood test.  Sexually transmitted disease (STD) testing.  Diabetes screening. This is done by checking your blood sugar (glucose) after you have not eaten for a while (fasting). You may have this done every 1-3 years.  Bone density scan. This is done to screen for osteoporosis. You may have this done starting at age 57.  Mammogram. This  may be done every 1-2 years. Talk to your health care provider about how often you should have regular mammograms. Talk with your health care provider about your test results, treatment options, and if necessary, the need for more tests. Vaccines  Your health care provider may recommend certain vaccines, such as:  Influenza vaccine. This is recommended every year.  Tetanus, diphtheria, and acellular pertussis (Tdap, Td) vaccine. You may need a Td booster every 10 years.  Zoster vaccine. You may need this after age 45.  Pneumococcal 13-valent conjugate (PCV13) vaccine. One dose is recommended after age 54.  Pneumococcal polysaccharide (PPSV23) vaccine. One dose is recommended after age 35. Talk to your health care provider about which screenings and vaccines you need and how often you need them. This information is not intended to replace advice given to you by your health care provider. Make sure you discuss any questions you have with your health care provider. Document Released: 11/06/2015 Document Revised: 06/29/2016 Document Reviewed: 08/11/2015 Elsevier Interactive Patient Education  2017 [place] Prevention in the Home Falls can cause injuries. They can happen to people of all ages. There are many things you can do to make your home safe and to help prevent falls. What can I do on the outside of my home?  Regularly fix the edges of walkways and driveways and fix any cracks.  Remove anything that might make you trip as you walk through a door, such as a raised step or threshold.  Trim any bushes or trees on the path to your home.  Use bright outdoor lighting.  Clear any walking paths of anything that might make someone trip, such as rocks or tools.  Regularly check to see if handrails are loose or broken. Make sure that both sides of any steps have handrails.  Any raised decks and porches should have guardrails on the edges.  Have any leaves, snow, or ice cleared  regularly.  Use sand or salt on walking paths during winter.  Clean up any spills in your garage right away. This includes oil or grease spills. What can I do in the bathroom?  Use night lights.  Install grab bars by the toilet and in the tub and shower. Do not use towel bars as grab bars.  Use non-skid mats or decals in the tub or shower.  If you need to sit down in the shower, use a plastic, non-slip stool.  Keep the floor dry. Clean up any water that spills on the floor as soon as it happens.  Remove soap buildup in the tub or shower regularly.  Attach bath mats securely with double-sided non-slip rug tape.  Do not have throw rugs and other things on the floor that can make you trip. What can I do in the bedroom?  Use night lights.  Make sure that you have a light by your bed that is easy to reach.  Do not use any sheets or blankets that are too big for your bed. They should not hang down onto the floor.  Have a firm chair that  has side arms. You can use this for support while you get dressed.  Do not have throw rugs and other things on the floor that can make you trip. What can I do in the kitchen?  Clean up any spills right away.  Avoid walking on wet floors.  Keep items that you use a lot in easy-to-reach places.  If you need to reach something above you, use a strong step stool that has a grab bar.  Keep electrical cords out of the way.  Do not use floor polish or wax that makes floors slippery. If you must use wax, use non-skid floor wax.  Do not have throw rugs and other things on the floor that can make you trip. What can I do with my stairs?  Do not leave any items on the stairs.  Make sure that there are handrails on both sides of the stairs and use them. Fix handrails that are broken or loose. Make sure that handrails are as long as the stairways.  Check any carpeting to make sure that it is firmly attached to the stairs. Fix any carpet that is loose  or worn.  Avoid having throw rugs at the top or bottom of the stairs. If you do have throw rugs, attach them to the floor with carpet tape.  Make sure that you have a light switch at the top of the stairs and the bottom of the stairs. If you do not have them, ask someone to add them for you. What else can I do to help prevent falls?  Wear shoes that:  Do not have high heels.  Have rubber bottoms.  Are comfortable and fit you well.  Are closed at the toe. Do not wear sandals.  If you use a stepladder:  Make sure that it is fully opened. Do not climb a closed stepladder.  Make sure that both sides of the stepladder are locked into place.  Ask someone to hold it for you, if possible.  Clearly mark and make sure that you can see:  Any grab bars or handrails.  First and last steps.  Where the edge of each step is.  Use tools that help you move around (mobility aids) if they are needed. These include:  Canes.  Walkers.  Scooters.  Crutches.  Turn on the lights when you go into a dark area. Replace any light bulbs as soon as they burn out.  Set up your furniture so you have a clear path. Avoid moving your furniture around.  If any of your floors are uneven, fix them.  If there are any pets around you, be aware of where they are.  Review your medicines with your doctor. Some medicines can make you feel dizzy. This can increase your chance of falling. Ask your doctor what other things that you can do to help prevent falls. This information is not intended to replace advice given to you by your health care provider. Make sure you discuss any questions you have with your health care provider. Document Released: 08/06/2009 Document Revised: 03/17/2016 Document Reviewed: 11/14/2014 Elsevier Interactive Patient Education  2017 [employer].

## 2020-07-20 NOTE — Progress Notes (Signed)
Subjective:   Annette Lee is a 84 y.o. female who presents for Medicare Annual (Subsequent) preventive examination.  Review of Systems    No ROS.  Medicare Wellness Virtual Visit.     Cardiac Risk Factors include: advanced age (>56men, >51 women)     Objective:    Today's Vitals   07/20/20 0906  Weight: 129 lb (58.5 kg)  Height: 5\' 4"  (1.626 m)   Body mass index is 22.14 kg/m.  Advanced Directives 07/20/2020 07/18/2019 07/16/2018 07/10/2017 06/29/2016  Does Patient Have a Medical Advance Directive? Yes Yes Yes Yes Yes  Type of Paramedic of [place];Living will [place];Living will [place];Living will [place];Living will [place];Living will  Does patient want to make changes to medical advance directive? No - Patient declined No - Patient declined No - Patient declined - No - Patient declined  Copy of [place] in Chart? No - copy requested No - copy requested No - copy requested Yes No - copy requested    Current Medications (verified) Outpatient Encounter Medications as of 07/20/2020  Medication Sig  . eszopiclone (LUNESTA) 2 MG TABS tablet Take 1 tablet (2 mg total) by mouth at bedtime as needed (for sleep.).  Marland Kitchen simvastatin (ZOCOR) 10 MG tablet Take 1 tablet (10 mg total) by mouth every Monday.   No facility-administered encounter medications on file as of 07/20/2020.    Allergies (verified) Contrast media [iodinated diagnostic agents]   History: Past Medical History:  Diagnosis Date  . Allergic rhinitis due to pollen   . Blood present in stool   . 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 Endometriosis   . Environmental and seasonal allergies   . GERD (gastroesophageal reflux disease)   . Hyperlipidemia   . Osteopenia 11/12   T -1.8 in hip  . Osteoporosis   . Sleep disturbance    . UTI (urinary tract infection)   . Vaginal atrophy    Past Surgical History:  Procedure Laterality Date  . ABDOMINAL HYSTERECTOMY     total   . APPENDECTOMY    . CATARACT EXTRACTION W/ INTRAOCULAR LENS  IMPLANT, BILATERAL    . COLONOSCOPY WITH PROPOFOL N/A 01/22/2016   Procedure: COLONOSCOPY WITH PROPOFOL;  Surgeon: Josefine Class, MD;  Location: [facility] ENDOSCOPY;  Service: Endoscopy;  Laterality: N/A;  . ROTATOR CUFF REPAIR Right ~2001  . SHOULDER ARTHROSCOPY WITH SUBACROMIAL DECOMPRESSION AND BICEP TENDON REPAIR Left 11/08/2018   Procedure: SHOULDER ARTHROSCOPY WITH DEBRIDEMENT, DECOMPRESSION AND ROTATOR CUFF REPAIR;  Surgeon: Corky Mull, MD;  Location: [facility] ORS;  Service: Orthopedics;  Laterality: Left;   Family History  Problem Relation Age of Onset  . Cancer Mother        CLL  . Cancer Father        colon  . Aneurysm Father   . Cancer Brother        colon cancer  . Heart disease Maternal Uncle   . Diabetes Neg Hx   . Breast cancer Neg Hx   . Bladder Cancer Neg Hx   . Kidney cancer Neg Hx    Social History   Socioeconomic History  . Marital status: Married    Spouse name: Kara Pacer  . Number of children: 0  . Years of education: Not on file  . Highest education level:  Not on file  Occupational History  . Occupation: Scientist, water quality    Comment: Retired  Tobacco Use  . Smoking status: Former Smoker    Quit date: 10/30/1966    Years since quitting: 53.7  . Smokeless tobacco: Never Used  . Tobacco comment: as of 08/2017 quit smoking >15 years ago 1ppd x 35 years no FH lung cancer   Vaping Use  . Vaping Use: Never used  Substance and Sexual Activity  . Alcohol use: Yes    Alcohol/week: 2.0 standard drinks    Types: 2 Shots of liquor per week    Comment: drinks gin per week  . Drug use: No  . Sexual activity: Never  Other Topics Concern  . Not on file  Social History Narrative   Has living will   Husband is health care POA.   Would  accept resuscitation but no prolonged ventilation   Not sure about tube feeds   Moved her from [place]    Exercises 3x per week    No kids    Social Determinants of Health   Financial Resource Strain: Low Risk   . Difficulty of Paying Living Expenses: Not hard at all  Food Insecurity: No Food Insecurity  . Worried About Charity fundraiser in the Last Year: Never true  . Ran Out of Food in the Last Year: Never true  Transportation Needs: No Transportation Needs  . Lack of Transportation (Medical): No  . Lack of Transportation (Non-Medical): No  Physical Activity:   . Days of Exercise per Week: Not on file  . Minutes of Exercise per Session: Not on file  Stress: No Stress Concern Present  . Feeling of Stress : Not at all  Social Connections: Unknown  . Frequency of Communication with Friends and Family: Not on file  . Frequency of Social Gatherings with Friends and Family: Not on file  . Attends Religious Services: Not on file  . Active Member of Clubs or Organizations: Not on file  . Attends Archivist Meetings: Not on file  . Marital Status: Married    Tobacco Counseling Counseling given: Not Answered Comment: as of 08/2017 quit smoking >15 years ago 1ppd x 35 years no FH lung cancer    Clinical Intake:  Pre-visit preparation completed: Yes        Diabetes: No  How often do you need to have someone help you when you read instructions, pamphlets, or other written materials from your doctor or pharmacy?: 1 - Never       Activities of Daily Living In your present state of health, do you have any difficulty performing the following activities: 07/20/2020  Hearing? N  Vision? N  Difficulty concentrating or making decisions? N  Walking or climbing stairs? N  Dressing or bathing? N  Doing errands, shopping? N  Preparing Food and eating ? N  Using the Toilet? N  In the past six months, have you accidently leaked urine? N  Do you have problems with  loss of bowel control? N  Managing your Medications? N  Managing your Finances? N  Housekeeping or managing your Housekeeping? N  Some recent data might be hidden    Patient Care Team: McLean-Scocuzza, Nino Glow, MD as PCP - General (Internal Medicine) Art Esperanza Sheets, MD as Consulting Physician (General Practice)  Indicate any recent Medical Services you may have received from other than [facility] providers in the past year (date may be approximate).     Assessment:  This is a routine wellness examination for Barnes.  I connected with [place] today by telephone and verified that I am speaking with the correct person using two identifiers. Location patient: home Location provider: work Persons participating in the virtual visit: patient, Marine scientist.    I discussed the limitations, risks, security and privacy concerns of performing an evaluation and management service by telephone and the availability of in person appointments. The patient expressed understanding and verbally consented to this telephonic visit.    Interactive audio and video telecommunications were attempted between this provider and patient, however failed, due to patient having technical difficulties OR patient did not have access to video capability.  We continued and completed visit with audio only.  Some vital signs may be absent or patient reported.   Hearing/Vision screen  Hearing Screening   125Hz  250Hz  500Hz  1000Hz  2000Hz  3000Hz  4000Hz  6000Hz  8000Hz   Right ear:           Left ear:           Comments: Patient is able to hear conversational tones without difficulty.  No issues reported.  Vision Screening Comments: Wears corrective lenses Visual acuity not assessed, virtual visit.  They have seen their ophthalmologist in the last 12 months.     Dietary issues and exercise activities discussed: Current Exercise Habits: Home exercise routine, Type of exercise: walking, Time (Minutes): 20, Frequency (Times/Week): 2,  Weekly Exercise (Minutes/Week): 40, Intensity: Mild  Goals      Patient Stated   .  Increase physical activity (pt-stated)      I would like to walk more for exercise.       Depression Screen PHQ 2/9 Scores 07/20/2020 07/14/2020 01/15/2020 07/18/2019 07/16/2018 01/15/2018 07/10/2017  PHQ - 2 Score 0 0 0 0 0 0 0  PHQ- 9 Score - - - - - - 0    Fall Risk Fall Risk  07/20/2020 07/14/2020 01/15/2020 07/18/2019 07/16/2018  Falls in the past year? 0 0 0 0 No  Number falls in past yr: 0 0 0 - -  Injury with Fall? - 0 0 - -  Follow up Falls evaluation completed Falls evaluation completed Falls evaluation completed - -   Handrails in use when climbing stairs? Yes Home free of loose throw rugs in walkways, pet beds, electrical cords, etc? Yes  Adequate lighting in your home to reduce risk of falls? Yes   ASSISTIVE DEVICES UTILIZED TO PREVENT FALLS: Use of a cane, walker or w/c? No   TIMED UP AND GO: Was the test performed? No . Virtual visit.    Cognitive Function: MMSE - Mini Mental State Exam 07/10/2017 06/29/2016  Orientation to time 5 5  Orientation to Place 5 5  Registration 3 3  Attention/ Calculation 0 0  Recall 3 3  Language- name 2 objects 0 0  Language- repeat 1 1  Language- follow 3 step command 3 3  Language- read & follow direction 0 0  Write a sentence 0 0  Copy design 0 0  Total score 20 20     6CIT Screen 07/18/2019 07/16/2018  What Year? 0 points 0 points  What month? 0 points 0 points  What time? 0 points 0 points  Count back from 20 0 points 0 points  Months in reverse 0 points 0 points  Repeat phrase 0 points 0 points  Total Score 0 0   Immunizations Immunization History  Administered Date(s) Administered  . Fluad Quad(high Dose 65+) 06/24/2019, 07/14/2020  .  Influenza Split 08/02/2013  . Influenza,inj,Quad PF,6+ Mos 07/08/2014, 07/13/2015, 06/29/2016, 07/10/2017, 07/16/2018  . Pneumococcal Conjugate-13 09/11/2014  . Pneumococcal Polysaccharide-23 09/10/2013   . Tdap 12/25/2017  . Zoster 09/11/2014    Health Maintenance Health Maintenance  Topic Date Due  . COVID-19 Vaccine (1) 08/05/2020 (Originally 06/12/1948)  . TETANUS/TDAP  12/26/2027  . INFLUENZA VACCINE  Completed  . DEXA SCAN  Completed  . PNA vac Low Risk Adult  Completed    Dental Screening: Recommended annual dental exams for proper oral hygiene.  Community Resource Referral / Chronic Care Management: CRR required this visit?  No   CCM required this visit?  No      Plan:   Keep all routine maintenance appointments.   Next scheduled lab 07/30/20 prolia; fasting labs  Follow up 01/14/20   I have personally reviewed and noted the following in the patient's chart:   . Medical and social history . Use of alcohol, tobacco or illicit drugs  . Current medications and supplements . Functional ability and status . Nutritional status . Physical activity . Advanced directives . List of other physicians . Hospitalizations, surgeries, and ER visits in previous 12 months . Vitals . Screenings to include cognitive, depression, and falls . Referrals and appointments  In addition, I have reviewed and discussed with patient certain preventive protocols, quality metrics, and best practice recommendations. A written personalized care plan for preventive services as well as general preventive health recommendations were provided to patient via mychart.     Varney Biles, LPN   [id number]

## 2020-07-30 ENCOUNTER — Ambulatory Visit (INDEPENDENT_AMBULATORY_CARE_PROVIDER_SITE_OTHER): Payer: Medicare Other

## 2020-07-30 ENCOUNTER — Other Ambulatory Visit: Payer: Medicare Other

## 2020-07-30 ENCOUNTER — Other Ambulatory Visit: Payer: Self-pay

## 2020-07-30 ENCOUNTER — Encounter: Payer: Self-pay | Admitting: Internal Medicine

## 2020-07-30 ENCOUNTER — Other Ambulatory Visit (INDEPENDENT_AMBULATORY_CARE_PROVIDER_SITE_OTHER): Payer: Medicare Other

## 2020-07-30 DIAGNOSIS — M8580 Other specified disorders of bone density and structure, unspecified site: Secondary | ICD-10-CM | POA: Diagnosis not present

## 2020-07-30 DIAGNOSIS — Z0000 Encounter for general adult medical examination without abnormal findings: Secondary | ICD-10-CM

## 2020-07-30 DIAGNOSIS — E538 Deficiency of other specified B group vitamins: Secondary | ICD-10-CM

## 2020-07-30 DIAGNOSIS — Z1329 Encounter for screening for other suspected endocrine disorder: Secondary | ICD-10-CM | POA: Diagnosis not present

## 2020-07-30 DIAGNOSIS — N3000 Acute cystitis without hematuria: Secondary | ICD-10-CM

## 2020-07-30 DIAGNOSIS — E785 Hyperlipidemia, unspecified: Secondary | ICD-10-CM

## 2020-07-30 LAB — COMPREHENSIVE METABOLIC PANEL
ALT: 9 U/L (ref 0–35)
AST: 17 U/L (ref 0–37)
Albumin: 4.1 g/dL (ref 3.5–5.2)
Alkaline Phosphatase: 43 U/L (ref 39–117)
BUN: 14 mg/dL (ref 6–23)
CO2: 30 mEq/L (ref 19–32)
Calcium: 9.2 mg/dL (ref 8.4–10.5)
Chloride: 108 mEq/L (ref 96–112)
Creatinine, Ser: 0.74 mg/dL (ref 0.40–1.20)
GFR: 75.37 mL/min (ref >60.00)
Glucose, Bld: 87 mg/dL (ref 70–99)
Potassium: 4.1 mEq/L (ref 3.5–5.1)
Sodium: 144 mEq/L (ref 135–145)
Total Bilirubin: 0.8 mg/dL (ref 0.2–1.2)
Total Protein: 6.4 g/dL (ref 6.0–8.3)

## 2020-07-30 LAB — CBC WITH DIFFERENTIAL/PLATELET
Basophils Absolute: 0 10*3/uL (ref 0.0–0.1)
Basophils Relative: 0.5 % (ref 0.0–3.0)
Eosinophils Absolute: 0.2 10*3/uL (ref 0.0–0.7)
Eosinophils Relative: 3.8 % (ref 0.0–5.0)
HCT: 42.1 % (ref 36.0–46.0)
Hemoglobin: 13.9 g/dL (ref 12.0–15.0)
Lymphocytes Relative: 23 % (ref 12.0–46.0)
Lymphs Abs: 1.3 10*3/uL (ref 0.7–4.0)
MCHC: 33 g/dL (ref 30.0–36.0)
MCV: 93 fl (ref 78.0–100.0)
Monocytes Absolute: 0.4 10*3/uL (ref 0.1–1.0)
Monocytes Relative: 7.7 % (ref 3.0–12.0)
Neutro Abs: 3.6 10*3/uL (ref 1.4–7.7)
Neutrophils Relative %: 65 % (ref 43.0–77.0)
Platelets: 163 10*3/uL (ref 150.0–400.0)
RBC: 4.53 Mil/uL (ref 3.87–5.11)
RDW: 13.4 % (ref 11.5–15.5)
WBC: 5.5 10*3/uL (ref 4.0–10.5)

## 2020-07-30 LAB — LIPID PANEL
Cholesterol: 174 mg/dL (ref 0–200)
HDL: 58 mg/dL (ref >39.00)
LDL Cholesterol: 103 mg/dL — ABNORMAL HIGH (ref 0–99)
NonHDL: 116.01
Total CHOL/HDL Ratio: 3
Triglycerides: 67 mg/dL (ref 0.0–149.0)
VLDL: 13.4 mg/dL (ref 0.0–40.0)

## 2020-07-30 LAB — TSH: TSH: 1.96 u[IU]/mL (ref 0.35–4.50)

## 2020-07-30 LAB — VITAMIN B12: Vitamin B-12: 109 pg/mL — ABNORMAL LOW (ref 211–911)

## 2020-07-30 MED ORDER — DENOSUMAB 60 MG/ML ~~LOC~~ SOSY
60.0000 mg | PREFILLED_SYRINGE | Freq: Once | SUBCUTANEOUS | Status: AC
  Administered 2020-07-30: 60 mg via SUBCUTANEOUS

## 2020-07-30 NOTE — Progress Notes (Signed)
Patient presented for 6-month Prolia injection SQ to left arm. Patient tolerated well. 

## 2020-07-31 DIAGNOSIS — H9313 Tinnitus, bilateral: Secondary | ICD-10-CM | POA: Diagnosis not present

## 2020-07-31 DIAGNOSIS — H903 Sensorineural hearing loss, bilateral: Secondary | ICD-10-CM | POA: Diagnosis not present

## 2020-07-31 LAB — URINALYSIS, ROUTINE W REFLEX MICROSCOPIC
Bacteria, UA: NONE SEEN /HPF
Bilirubin Urine: NEGATIVE
Glucose, UA: NEGATIVE
Hgb urine dipstick: NEGATIVE
Hyaline Cast: NONE SEEN /LPF
Ketones, ur: NEGATIVE
Nitrite: NEGATIVE
Protein, ur: NEGATIVE
Specific Gravity, Urine: 1.013 (ref 1.001–1.03)
Squamous Epithelial / HPF: NONE SEEN /HPF (ref <=5)
WBC, UA: NONE SEEN /HPF (ref 0–5)
pH: 7 (ref 5.0–8.0)

## 2020-07-31 LAB — URINE CULTURE
MICRO NUMBER:: 11044257
SPECIMEN QUALITY:: ADEQUATE

## 2020-08-03 ENCOUNTER — Telehealth: Payer: Self-pay | Admitting: Internal Medicine

## 2020-08-03 NOTE — Telephone Encounter (Signed)
Annette Glow McLean-Scocuzza, MD  08/02/2020  6:16 PM EDT     Urine culture no UTI  Rec B12 1000 mcg daily pills otc if does not want to do injections  Will repeat B12 at f/u    Patient states she will do OTC B12.

## 2020-08-03 NOTE — Telephone Encounter (Signed)
Patient was returning call for results 

## 2020-08-11 DIAGNOSIS — H903 Sensorineural hearing loss, bilateral: Secondary | ICD-10-CM | POA: Diagnosis not present

## 2020-08-27 ENCOUNTER — Telehealth: Payer: Self-pay

## 2020-08-27 ENCOUNTER — Encounter: Payer: Self-pay | Admitting: Internal Medicine

## 2020-08-27 NOTE — Telephone Encounter (Signed)
Pt has questions about simvastatin (ZOCOR) 10 MG tablet. Pt is going to call pharm with questions but would like a call back from our office as well.

## 2020-08-27 NOTE — Telephone Encounter (Signed)
No answer, no voicemail.

## 2020-09-08 DIAGNOSIS — Z23 Encounter for immunization: Secondary | ICD-10-CM | POA: Diagnosis not present

## 2020-09-14 NOTE — Telephone Encounter (Signed)
Spoke with the Patient and she states she does have any questions at this moment.

## 2020-09-18 ENCOUNTER — Other Ambulatory Visit: Payer: Medicare Other

## 2020-10-28 ENCOUNTER — Encounter: Payer: Self-pay | Admitting: Internal Medicine

## 2020-10-28 ENCOUNTER — Telehealth (INDEPENDENT_AMBULATORY_CARE_PROVIDER_SITE_OTHER): Payer: Medicare Other | Admitting: Internal Medicine

## 2020-10-28 VITALS — Ht 64.02 in | Wt 128.0 lb

## 2020-10-28 DIAGNOSIS — E538 Deficiency of other specified B group vitamins: Secondary | ICD-10-CM | POA: Diagnosis not present

## 2020-10-28 DIAGNOSIS — M47814 Spondylosis without myelopathy or radiculopathy, thoracic region: Secondary | ICD-10-CM

## 2020-10-28 DIAGNOSIS — M47816 Spondylosis without myelopathy or radiculopathy, lumbar region: Secondary | ICD-10-CM | POA: Insufficient documentation

## 2020-10-28 NOTE — Patient Instructions (Addendum)
Tylenol arthritis  Collagen vital proteins, tumeric/ginger Petra Kuba Made), glucosamine/chrondroitin, CoQ10 Heating pad  Lidocaine or salonpas pain patch over the counter   Thoracic Strain Rehab Ask your health care provider which exercises are safe for you. Do exercises exactly as told by your health care provider and adjust them as directed. It is normal to feel mild stretching, pulling, tightness, or discomfort as you do these exercises. Stop right away if you feel sudden pain or your pain gets worse. Do not begin these exercises until told by your health care provider. Stretching and range-of-motion exercise This exercise warms up your muscles and joints and improves the movement and flexibility of your back and shoulders. This exercise also helps to relieve pain. Chest and spine stretch  1. Lie down on your back on a firm surface. 2. Roll a towel or a small blanket so it is about 4 inches (10 cm) in diameter. 3. Put the towel lengthwise under the middle of your back so it is under your spine, but not under your shoulder blades. 4. Put your hands behind your head and let your elbows fall to your sides. This will increase your stretch. 5. Take a deep breath (inhale). 6. Hold for __________ seconds. 7. Relax after you breathe out (exhale). Repeat __________ times. Complete this exercise __________ times a day. Strengthening exercises These exercises build strength and endurance in your back and your shoulder blade muscles. Endurance is the ability to use your muscles for a long time, even after they get tired. Alternating arm and leg raises  1. Get on your hands and knees on a firm surface. If you are on a hard floor, you may want to use padding, such as an exercise mat, to cushion your knees. 2. Line up your arms and legs. Your hands should be directly below your shoulders, and your knees should be directly below your hips. 3. Lift your left leg behind you. At the same time, raise your  right arm and straighten it in front of you. ? Do not lift your leg higher than your hip. ? Do not lift your arm higher than your shoulder. ? Keep your abdominal and back muscles tight. ? Keep your hips facing the ground. ? Do not arch your back. ? Keep your balance carefully, and do not hold your breath. 4. Hold for __________ seconds. 5. Slowly return to the starting position and repeat with your right leg and your left arm. Repeat __________ times. Complete this exercise __________ times a day. Straight arm rows This exercise is also called shoulder extension exercise. 1. Stand with your feet shoulder width apart. 2. Secure an exercise band to a stable object in front of you so the band is at or above shoulder height. 3. Hold one end of the exercise band in each hand. 4. Straighten your elbows and lift your hands up to shoulder height. 5. Step back, away from the secured end of the exercise band, until the band stretches. 6. Squeeze your shoulder blades together and pull your hands down to the sides of your thighs. Stop when your hands are straight down by your sides. This is shoulder extension. Do not let your hands go behind your body. 7. Hold for __________ seconds. 8. Slowly return to the starting position. Repeat __________ times. Complete this exercise __________ times a day. Prone shoulder external rotation 1. Lie on your abdomen on a firm bed so your left / right forearm hangs over the edge of the bed and  your upper arm is on the bed, straight out from your body. This is the prone position. ? Your elbow should be bent. ? Your palm should be facing your feet. 2. If instructed, hold a __________ weight in your hand. 3. Squeeze your shoulder blade toward the middle of your back. Do not let your shoulder lift toward your ear. 4. Keep your elbow bent in a 90-degree angle (right angle) while you slowly move your forearm up toward the ceiling. Move your forearm up to the height of the  bed, toward your head. This is external rotation. ? Your upper arm should not move. ? At the top of the movement, your palm should face the floor. 5. Hold for __________ seconds. 6. Slowly return to the starting position and relax your muscles. Repeat __________ times. Complete this exercise __________ times a day. Rowing scapular retraction This is an exercise in which the shoulder blades (scapulae) are pulled toward each other (retraction). 1. Sit in a stable chair without armrests, or stand up. 2. Secure an exercise band to a stable object in front of you so the band is at shoulder height. 3. Hold one end of the exercise band in each hand. Your palms should face down. 4. Bring your arms out straight in front of you. 5. Step back, away from the secured end of the exercise band, until the band stretches. 6. Pull the band backward. As you do this, bend your elbows and squeeze your shoulder blades together, but avoid letting the rest of your body move. Do not shrug your shoulders upward while you do this. 7. Stop when your elbows are at your sides or slightly behind your body. 8. Hold for __________ seconds. 9. Slowly straighten your arms to return to the starting position. Repeat __________ times. Complete this exercise __________ times a day. Posture and body mechanics Good posture and healthy body mechanics can help to relieve stress in your body's tissues and joints. Body mechanics refers to the movements and positions of your body while you do your daily activities. Posture is part of body mechanics. Good posture means:  Your spine is in its natural S-curve position (neutral).  Your shoulders are pulled back slightly.  Your head is not tipped forward. Follow these guidelines to improve your posture and body mechanics in your everyday activities. Standing   When standing, keep your spine neutral and your feet about hip width apart. Keep a slight bend in your knees. Your ears,  shoulders, and hips should line up with each other.  When you do a task in which you lean forward while standing in one place for a long time, place one foot up on a stable object that is 2-4 inches (5-10 cm) high, such as a footstool. This helps keep your spine neutral. Sitting   When sitting, keep your spine neutral and keep your feet flat on the floor. Use a footrest, if necessary, and keep your thighs parallel to the floor. Avoid rounding your shoulders, and avoid tilting your head forward.  When working at a desk or a computer, keep your desk at a height where your hands are slightly lower than your elbows. Slide your chair under your desk so you are close enough to maintain good posture.  When working at a computer, place your monitor at a height where you are looking straight ahead and you do not have to tilt your head forward or downward to look at the screen. Resting When lying down and resting,  avoid positions that are most painful for you.  If you have pain with activities such as sitting, bending, stooping, or squatting (flexion-basedactivities), lie in a position in which your body does not bend very much. For example, avoid curling up on your side with your arms and knees near your chest (fetal position).  If you have pain with activities such as standing for a long time or reaching with your arms (extension-basedactivities), lie with your spine in a neutral position and bend your knees slightly. Try the following positions: ? Lie on your side with a pillow between your knees. ? Lie on your back with a pillow under your knees.  Lifting   When lifting objects, keep your feet at least shoulder width apart and tighten your abdominal muscles.  Bend your knees and hips and keep your spine neutral. It is important to lift using the strength of your legs, not your back. Do not lock your knees straight out.  Always ask for help to lift heavy or awkward objects. This information is  not intended to replace advice given to you by your health care provider. Make sure you discuss any questions you have with your health care provider. Document Revised: 02/01/2019 Document Reviewed: 11/19/2018 Elsevier Patient Education  [place]. Back Exercises The following exercises strengthen the muscles that help to support the trunk and back. They also help to keep the lower back flexible. Doing these exercises can help to prevent back pain or lessen existing pain.  If you have back pain or discomfort, try doing these exercises 2-3 times each day or as told by your health care provider.  As your pain improves, do them once each day, but increase the number of times that you repeat the steps for each exercise (do more repetitions).  To prevent the recurrence of back pain, continue to do these exercises once each day or as told by your health care provider. Do exercises exactly as told by your health care provider and adjust them as directed. It is normal to feel mild stretching, pulling, tightness, or discomfort as you do these exercises, but you should stop right away if you feel sudden pain or your pain gets worse. Exercises Single knee to chest Repeat these steps 3-5 times for each leg: 1. Lie on your back on a firm bed or the floor with your legs extended. 2. Bring one knee to your chest. Your other leg should stay extended and in contact with the floor. 3. Hold your knee in place by grabbing your knee or thigh with both hands and hold. 4. Pull on your knee until you feel a gentle stretch in your lower back or buttocks. 5. Hold the stretch for 10-30 seconds. 6. Slowly release and straighten your leg. Pelvic tilt Repeat these steps 5-10 times: 1. Lie on your back on a firm bed or the floor with your legs extended. 2. Bend your knees so they are pointing toward the ceiling and your feet are flat on the floor. 3. Tighten your lower abdominal muscles to press your lower back  against the floor. This motion will tilt your pelvis so your tailbone points up toward the ceiling instead of pointing to your feet or the floor. 4. With gentle tension and even breathing, hold this position for 5-10 seconds. Cat-cow Repeat these steps until your lower back becomes more flexible: 1. Get into a hands-and-knees position on a firm surface. Keep your hands under your shoulders, and keep your knees under  your hips. You may place padding under your knees for comfort. 2. Let your head hang down toward your chest. Contract your abdominal muscles and point your tailbone toward the floor so your lower back becomes rounded like the back of a cat. 3. Hold this position for 5 seconds. 4. Slowly lift your head, let your abdominal muscles relax and point your tailbone up toward the ceiling so your back forms a sagging arch like the back of a cow. 5. Hold this position for 5 seconds.  Press-ups Repeat these steps 5-10 times: 1. Lie on your abdomen (face-down) on the floor. 2. Place your palms near your head, about shoulder-width apart. 3. Keeping your back as relaxed as possible and keeping your hips on the floor, slowly straighten your arms to raise the top half of your body and lift your shoulders. Do not use your back muscles to raise your upper torso. You may adjust the placement of your hands to make yourself more comfortable. 4. Hold this position for 5 seconds while you keep your back relaxed. 5. Slowly return to lying flat on the floor.  Bridges Repeat these steps 10 times: 1. Lie on your back on a firm surface. 2. Bend your knees so they are pointing toward the ceiling and your feet are flat on the floor. Your arms should be flat at your sides, next to your body. 3. Tighten your buttocks muscles and lift your buttocks off the floor until your waist is at almost the same height as your knees. You should feel the muscles working in your buttocks and the back of your thighs. If you do  not feel these muscles, slide your feet 1-2 inches farther away from your buttocks. 4. Hold this position for 3-5 seconds. 5. Slowly lower your hips to the starting position, and allow your buttocks muscles to relax completely. If this exercise is too easy, try doing it with your arms crossed over your chest. Abdominal crunches Repeat these steps 5-10 times: 1. Lie on your back on a firm bed or the floor with your legs extended. 2. Bend your knees so they are pointing toward the ceiling and your feet are flat on the floor. 3. Cross your arms over your chest. 4. Tip your chin slightly toward your chest without bending your neck. 5. Tighten your abdominal muscles and slowly raise your trunk (torso) high enough to lift your shoulder blades a tiny bit off the floor. Avoid raising your torso higher than that because it can put too much stress on your low back and does not help to strengthen your abdominal muscles. 6. Slowly return to your starting position. Back lifts Repeat these steps 5-10 times: 1. Lie on your abdomen (face-down) with your arms at your sides, and rest your forehead on the floor. 2. Tighten the muscles in your legs and your buttocks. 3. Slowly lift your chest off the floor while you keep your hips pressed to the floor. Keep the back of your head in line with the curve in your back. Your eyes should be looking at the floor. 4. Hold this position for 3-5 seconds. 5. Slowly return to your starting position. Contact a health care provider if:  Your back pain or discomfort gets much worse when you do an exercise.  Your worsening back pain or discomfort does not lessen within 2 hours after you exercise. If you have any of these problems, stop doing these exercises right away. Do not do them again unless your health  care provider says that you can. Get help right away if:  You develop sudden, severe back pain. If this happens, stop doing the exercises right away. Do not do them  again unless your health care provider says that you can. This information is not intended to replace advice given to you by your health care provider. Make sure you discuss any questions you have with your health care provider. Document Revised: 02/14/2019 Document Reviewed: 07/12/2018 Elsevier Patient Education  2020 [place] your health care provider which exercises are safe for you. Do exercises exactly as told by your health care provider and adjust them as directed. It is normal to feel mild stretching, pulling, tightness, or discomfort as you do these exercises. Stop right away if you feel sudden pain or your pain gets worse. Do not begin these exercises until told by your health care provider. Stretching and range-of-motion exercises These exercises warm up your muscles and joints and improve the movement and flexibility of your hips and your back. These exercises may also help to relieve pain, numbness, and tingling. Single knee to chest  8. Lie on your back on a firm surface with both legs straight. 9. Bend one of your knees. Use your hands to move your knee up toward your chest until you feel a gentle stretch in your lower back and buttock. ? Hold your leg in this position by holding on to the front of your knee. ? Keep your other leg as straight as possible. 10. Hold for __________ seconds. 11. Slowly return to the starting position. 12. Repeat this exercise with your other leg. Repeat __________ times. Complete this exercise __________ times a day. Hamstring stretch, supine  6. Lie on your back (supine position). 7. Loop a belt or towel over the ball of your left / right foot. The ball of your foot is on the walking surface, right under your toes. 8. Straighten your left / right knee and slowly pull on the belt or towel to raise your leg. Raise your leg until you feel a gentle stretch behind your knee or thigh (hamstring). ? Do not let your left /  right knee bend while you do this. ? Keep your other leg flat on the floor. 9. Hold this position for __________ seconds. 10. Slowly return your leg to the starting position. 11. Repeat this exercise with your other leg. Repeat __________ times. Complete this exercise __________ times a day. Strengthening exercises These exercises build strength and endurance in your back. Endurance is the ability to use your muscles for a long time, even after they get tired. Pelvic tilt This exercise strengthens the muscles that lie deep in the abdomen. 9. Lie on your back on a firm bed or the floor. Bend your knees and keep your feet flat. 10. Tense your abdominal muscles. Tip your pelvis up toward the ceiling and flatten your lower back into the floor. ? To help with this exercise, you may place a small towel under your lower back and try to push your back into the towel. 11. Hold for __________ seconds. 12. Let your muscles relax completely before you repeat this exercise. Repeat __________ times. Complete this exercise __________ times a day. Abdominal crunch  7. Lie on your back on a firm surface. Bend your knees and keep your feet flat. Cross your arms over your chest. 8. Tuck your chin down toward your chest, without bending your neck. 9. Use your abdominal muscles to lift  your upper body off the ground, straight up into the air. ? Try to lift yourself until your shoulder blades are off the ground. You may need to work up to this. ? Keep your lower back on the ground while you crunch upward. ? Do not hold your breath. 10. Slowly lower yourself down. Keep your abdominal muscles tense until you are back to the starting position. Repeat __________ times. Complete this exercise __________ times a day. Alternating arm and leg raises  10. Get on your hands and knees on a firm surface. If you are on a hard floor, you may want to use padding, such as an exercise mat, to cushion your knees. 11. Line up  your arms and legs. Your hands should be directly below your shoulders, and your knees should be directly below your hips. 12. Lift your left leg behind you. At the same time, raise your right arm and straighten it in front of you. ? Do not lift your leg higher than your hip. ? Do not lift your arm higher than your shoulder. ? Keep your abdominal and back muscles tight. ? Keep your hips facing the ground. ? Do not arch your back. ? Keep your balance carefully, and do not hold your breath. 13. Hold for __________ seconds. 14. Slowly return to the starting position. 15. Repeat with your right leg and your left arm. Repeat __________ times. Complete this exercise __________ times a day. Posture and body mechanics Good posture and healthy body mechanics can help to relieve stress in your body's tissues and joints. Body mechanics refers to the movements and positions of your body while you do your daily activities. Posture is part of body mechanics. Good posture means:  Your spine is in its natural S-curve position (neutral).  Your shoulders are pulled back slightly.  Your head is not tipped forward. Follow these guidelines to improve your posture and body mechanics in your everyday activities. Standing   When standing, keep your spine neutral and your feet about hip width apart. Keep a slight bend in your knees. Your ears, shoulders, and hips should line up.  When you do a task in which you stand in one place for a long time, place one foot up on a stable object that is 2-4 inches (5-10 cm) high, such as a footstool. This helps keep your spine neutral. Sitting   When sitting, keep your spine neutral and keep your feet flat on the floor. Use a footrest, if necessary, and keep your thighs parallel to the floor. Avoid rounding your shoulders, and avoid tilting your head forward.  When working at a desk or a computer, keep your desk at a height where your hands are slightly lower than your  elbows. Slide your chair under your desk so you are close enough to maintain good posture.  When working at a computer, place your monitor at a height where you are looking straight ahead and you do not have to tilt your head forward or downward to look at the screen. Resting   When lying down and resting, avoid positions that are most painful for you.  If you have pain with activities such as sitting, bending, stooping, or squatting (flexion-based activities), lie in a position in which your body does not bend very much. For example, avoid curling up on your side with your arms and knees near your chest (fetal position).  If you have pain with activities such as standing for a long time or reaching  with your arms (extension-based activities), lie with your spine in a neutral position and bend your knees slightly. Try the following positions: ? Lying on your side with a pillow between your knees. ? Lying on your back with a pillow under your knees. Lifting   When lifting objects, keep your feet at least shoulder width apart and tighten your abdominal muscles.  Bend your knees and hips and keep your spine neutral. It is important to lift using the strength of your legs, not your back. Do not lock your knees straight out.  Always ask for help to lift heavy or awkward objects. This information is not intended to replace advice given to you by your health care provider. Make sure you discuss any questions you have with your health care provider. Document Revised: 02/04/2019 Document Reviewed: 02/04/2019 Elsevier Patient Education  [place] or Strain Rehab Ask your health care provider which exercises are safe for you. Do exercises exactly as told by your health care provider and adjust them as directed. It is normal to feel mild stretching, pulling, tightness, or discomfort as you do these exercises. Stop right away if you feel sudden pain or your pain gets worse. Do  not begin these exercises until told by your health care provider. Stretching and range-of-motion exercises These exercises warm up your muscles and joints and improve the movement and flexibility of your back. These exercises also help to relieve pain, numbness, and tingling. Lumbar rotation  13. Lie on your back on a firm surface and bend your knees. 14. Straighten your arms out to your sides so each arm forms a 90-degree angle (right angle) with a side of your body. 15. Slowly move (rotate) both of your knees to one side of your body until you feel a stretch in your lower back (lumbar). Try not to let your shoulders lift off the floor. 16. Hold this position for __________ seconds. 17. Tense your abdominal muscles and slowly move your knees back to the starting position. 18. Repeat this exercise on the other side of your body. Repeat __________ times. Complete this exercise __________ times a day. Single knee to chest  12. Lie on your back on a firm surface with both legs straight. 63. Bend one of your knees. Use your hands to move your knee up toward your chest until you feel a gentle stretch in your lower back and buttock. ? Hold your leg in this position by holding on to the front of your knee. ? Keep your other leg as straight as possible. 14. Hold this position for __________ seconds. 15. Slowly return to the starting position. 16. Repeat with your other leg. Repeat __________ times. Complete this exercise __________ times a day. Prone extension on elbows  13. Lie on your abdomen on a firm surface (prone position). 14. Prop yourself up on your elbows. 15. Use your arms to help lift your chest up until you feel a gentle stretch in your abdomen and your lower back. ? This will place some of your body weight on your elbows. If this is uncomfortable, try stacking pillows under your chest. ? Your hips should stay down, against the surface that you are lying on. Keep your hip and back  muscles relaxed. 16. Hold this position for __________ seconds. 17. Slowly relax your upper body and return to the starting position. Repeat __________ times. Complete this exercise __________ times a day. Strengthening exercises These exercises build strength and endurance in  your back. Endurance is the ability to use your muscles for a long time, even after they get tired. Pelvic tilt This exercise strengthens the muscles that lie deep in the abdomen. 11. Lie on your back on a firm surface. Bend your knees and keep your feet flat on the floor. 12. Tense your abdominal muscles. Tip your pelvis up toward the ceiling and flatten your lower back into the floor. ? To help with this exercise, you may place a small towel under your lower back and try to push your back into the towel. 13. Hold this position for __________ seconds. 14. Let your muscles relax completely before you repeat this exercise. Repeat __________ times. Complete this exercise __________ times a day. Alternating arm and leg raises  16. Get on your hands and knees on a firm surface. If you are on a hard floor, you may want to use padding, such as an exercise mat, to cushion your knees. 17. Line up your arms and legs. Your hands should be directly below your shoulders, and your knees should be directly below your hips. 18. Lift your left leg behind you. At the same time, raise your right arm and straighten it in front of you. ? Do not lift your leg higher than your hip. ? Do not lift your arm higher than your shoulder. ? Keep your abdominal and back muscles tight. ? Keep your hips facing the ground. ? Do not arch your back. ? Keep your balance carefully, and do not hold your breath. 19. Hold this position for __________ seconds. 20. Slowly return to the starting position. 21. Repeat with your right leg and your left arm. Repeat __________ times. Complete this exercise __________ times a day. Abdominal set with straight leg  raise  1. Lie on your back on a firm surface. 2. Bend one of your knees and keep your other leg straight. 3. Tense your abdominal muscles and lift your straight leg up, 4-6 inches (10-15 cm) off the ground. 4. Keep your abdominal muscles tight and hold this position for __________ seconds. ? Do not hold your breath. ? Do not arch your back. Keep it flat against the ground. 5. Keep your abdominal muscles tense as you slowly lower your leg back to the starting position. 6. Repeat with your other leg. Repeat __________ times. Complete this exercise __________ times a day. Single leg lower with bent knees 1. Lie on your back on a firm surface. 2. Tense your abdominal muscles and lift your feet off the floor, one foot at a time, so your knees and hips are bent in 90-degree angles (right angles). ? Your knees should be over your hips and your lower legs should be parallel to the floor. 3. Keeping your abdominal muscles tense and your knee bent, slowly lower one of your legs so your toe touches the ground. 4. Lift your leg back up to return to the starting position. ? Do not hold your breath. ? Do not let your back arch. Keep your back flat against the ground. 5. Repeat with your other leg. Repeat __________ times. Complete this exercise __________ times a day. Posture and body mechanics Good posture and healthy body mechanics can help to relieve stress in your body's tissues and joints. Body mechanics refers to the movements and positions of your body while you do your daily activities. Posture is part of body mechanics. Good posture means:  Your spine is in its natural S-curve position (neutral).  Your shoulders are  pulled back slightly.  Your head is not tipped forward. Follow these guidelines to improve your posture and body mechanics in your everyday activities. Standing   When standing, keep your spine neutral and your feet about hip width apart. Keep a slight bend in your knees. Your  ears, shoulders, and hips should line up.  When you do a task in which you stand in one place for a long time, place one foot up on a stable object that is 2-4 inches (5-10 cm) high, such as a footstool. This helps keep your spine neutral. Sitting   When sitting, keep your spine neutral and keep your feet flat on the floor. Use a footrest, if necessary, and keep your thighs parallel to the floor. Avoid rounding your shoulders, and avoid tilting your head forward.  When working at a desk or a computer, keep your desk at a height where your hands are slightly lower than your elbows. Slide your chair under your desk so you are close enough to maintain good posture.  When working at a computer, place your monitor at a height where you are looking straight ahead and you do not have to tilt your head forward or downward to look at the screen. Resting  When lying down and resting, avoid positions that are most painful for you.  If you have pain with activities such as sitting, bending, stooping, or squatting, lie in a position in which your body does not bend very much. For example, avoid curling up on your side with your arms and knees near your chest (fetal position).  If you have pain with activities such as standing for a long time or reaching with your arms, lie with your spine in a neutral position and bend your knees slightly. Try the following positions: ? Lying on your side with a pillow between your knees. ? Lying on your back with a pillow under your knees. Lifting   When lifting objects, keep your feet at least shoulder width apart and tighten your abdominal muscles.  Bend your knees and hips and keep your spine neutral. It is important to lift using the strength of your legs, not your back. Do not lock your knees straight out.  Always ask for help to lift heavy or awkward objects. This information is not intended to replace advice given to you by your health care provider. Make sure  you discuss any questions you have with your health care provider. Document Revised: 02/01/2019 Document Reviewed: 11/01/2018 Elsevier Patient Education  [place].

## 2020-10-28 NOTE — Progress Notes (Signed)
Telephone note  I connected with Annette Lee  on 10/28/20 at 11:45 AM EST by a telephone and verified that I am speaking with the correct person using two identifiers.  Location patient: home, [place] Location provider:work or home office Persons participating in the virtual visit: patient, provider  I discussed the limitations of evaluation and management by telemedicine and the availability of in person appointments. The patient expressed understanding and agreed to proceed.   HPI: 1. Low back pain deg arthritis noted on CT 01/02/18 thoracic/lumbar L4/5 eating yeast in diet due to recent travel to [place] has flared her arthritis in her back she thinks though worse x 1 year and tried PT in the past and did not help Back is sore and denies pain today  2. B12 low 109 07/2020 will take b12 1000 mcg otc and check at f/u   ROS: See pertinent positives and negatives per HPI.  Past Medical History:  Diagnosis Date  . Allergic rhinitis due to pollen   . Blood present in stool   . Cancer ([place])    bcc forehead s/p Mohs Dr. Roel Cluck (of note nmsc x 2 face and 1x neck)   . Endometriosis   . Environmental and seasonal allergies   . GERD (gastroesophageal reflux disease)   . Hyperlipidemia   . Osteopenia 11/12   T -1.8 in hip  . Osteoporosis   . Sleep disturbance   . UTI (urinary tract infection)   . Vaginal atrophy     Past Surgical History:  Procedure Laterality Date  . ABDOMINAL HYSTERECTOMY     total   . APPENDECTOMY    . CATARACT EXTRACTION W/ INTRAOCULAR LENS  IMPLANT, BILATERAL    . COLONOSCOPY WITH PROPOFOL N/A 01/22/2016   Procedure: COLONOSCOPY WITH PROPOFOL;  Surgeon: Josefine Class, MD;  Location: [facility] ENDOSCOPY;  Service: Endoscopy;  Laterality: N/A;  . ROTATOR CUFF REPAIR Right ~2001  . SHOULDER ARTHROSCOPY WITH SUBACROMIAL DECOMPRESSION AND BICEP TENDON REPAIR Left 11/08/2018   Procedure: SHOULDER ARTHROSCOPY WITH DEBRIDEMENT, DECOMPRESSION AND ROTATOR CUFF  REPAIR;  Surgeon: Corky Mull, MD;  Location: [facility] ORS;  Service: Orthopedics;  Laterality: Left;     Current Outpatient Medications:  .  eszopiclone (LUNESTA) 2 MG TABS tablet, Take 1 tablet (2 mg total) by mouth at bedtime as needed (for sleep.)., Disp: 30 tablet, Rfl: 5 .  simvastatin (ZOCOR) 10 MG tablet, Take 1 tablet (10 mg total) by mouth every Monday., Disp: 15 tablet, Rfl: 11  EXAM:  VITALS per patient if applicable:  GENERAL: alert, oriented, appears well and in no acute distress  PSYCH/NEURO: pleasant and cooperative, no obvious depression or anxiety, speech and thought processing grossly intact  ASSESSMENT AND PLAN:  Discussed the following assessment and plan:  Arthritis, lumbar spine Thoracic arthritis -declines PT for now  Given exercises to do  Tylenol arthritis  Collagen vital proteins, tumeric/ginger Petra Kuba Made), glucosamine/chrondroitin, CoQ10 Heating pad  Lidocaine or salonpas pain patch over the counter  B12 deficiency Check B12 at f/u   HM Fasting labs 07/30/20, check labs f/u 01/2021 B12 included   Flu shot utd Hadpna vaccines x 2 -pna 23utd no need for futher Had zostervax -given Rx shingrixhad not hadwill check ncir Tdaphad 12/25/17 covid vx had 3/3 moderna pt to bring in log of this  Out of pap window s/p total hysterectomyjust saw OB/GYN 12/2017  mammo neg 02/03/20 negativeordered   dexa 2/2021osteopenia high FRAX score -01/29/20 proliaordered had dose 07/30/20; next due  at f/u   Colonoscopy: 01/22/16 Dr. Shelle Iron h/o colon polpys. colonoscopy was difficult per report and she didn't like the MD who did the procedure. Reviewed +IH and adhesions making exam difficult.   cologuard done 02/03/16 repeat due 02/03/2019 until age 47 reviewed with pt ok with this planwill re ordered closer to date -+cologuard4/17/20 -colonoscopy 03/05/19 IH otherwise neg  Saw dermatology on [place] 06/16/17 had [place] [facility] f/u in 1 year,  recentlysaw 06/2019  H/o nmsc x 2 face and 1 neck h/o mohs [facility] Dr. Lorn Junes dermatology appt 07/13/20 had [facility] Drone wnl f/u in 1 year  She is former smoker quit >15 years ago smoked 1 ppd x 35 yeas no FH lung cancer  -consider CT low dose if insurance will cover in future   Cont exercise 3x per week   -we discussed possible serious and likely etiologies, options for evaluation and workup, limitations of telemedicine visit vs in person visit, treatment, treatment risks and precautions.   I discussed the assessment and treatment plan with the patient. The patient was provided an opportunity to ask questions and all were answered. The patient agreed with the plan and demonstrated an understanding of the instructions.    Time spent 20 min Bevelyn Buckles, MD

## 2020-11-18 DIAGNOSIS — J310 Chronic rhinitis: Secondary | ICD-10-CM | POA: Diagnosis not present

## 2020-12-14 ENCOUNTER — Other Ambulatory Visit: Payer: Self-pay | Admitting: Internal Medicine

## 2020-12-14 DIAGNOSIS — E785 Hyperlipidemia, unspecified: Secondary | ICD-10-CM

## 2020-12-15 ENCOUNTER — Other Ambulatory Visit: Payer: Self-pay | Admitting: Internal Medicine

## 2020-12-15 DIAGNOSIS — E785 Hyperlipidemia, unspecified: Secondary | ICD-10-CM

## 2020-12-16 ENCOUNTER — Telehealth: Payer: Self-pay | Admitting: Internal Medicine

## 2020-12-16 NOTE — Telephone Encounter (Signed)
Prior authorization has been submitted for patient's eszopiclone (LUNESTA) 2 MG TABS tablet  Awaiting approval or denial.

## 2021-01-13 ENCOUNTER — Ambulatory Visit: Payer: Medicare Other | Admitting: Internal Medicine

## 2021-01-21 DIAGNOSIS — H43813 Vitreous degeneration, bilateral: Secondary | ICD-10-CM | POA: Diagnosis not present

## 2021-01-21 DIAGNOSIS — H5201 Hypermetropia, right eye: Secondary | ICD-10-CM | POA: Diagnosis not present

## 2021-01-21 DIAGNOSIS — Z961 Presence of intraocular lens: Secondary | ICD-10-CM | POA: Diagnosis not present

## 2021-01-21 DIAGNOSIS — H52223 Regular astigmatism, bilateral: Secondary | ICD-10-CM | POA: Diagnosis not present

## 2021-01-21 DIAGNOSIS — H5212 Myopia, left eye: Secondary | ICD-10-CM | POA: Diagnosis not present

## 2021-01-21 DIAGNOSIS — H04123 Dry eye syndrome of bilateral lacrimal glands: Secondary | ICD-10-CM | POA: Diagnosis not present

## 2021-01-28 ENCOUNTER — Other Ambulatory Visit: Payer: Self-pay

## 2021-01-28 ENCOUNTER — Ambulatory Visit (INDEPENDENT_AMBULATORY_CARE_PROVIDER_SITE_OTHER): Payer: Medicare Other | Admitting: Internal Medicine

## 2021-01-28 ENCOUNTER — Encounter: Payer: Self-pay | Admitting: Internal Medicine

## 2021-01-28 VITALS — BP 116/70 | HR 70 | Temp 97.7°F | Ht 64.02 in | Wt 132.0 lb

## 2021-01-28 DIAGNOSIS — H9193 Unspecified hearing loss, bilateral: Secondary | ICD-10-CM | POA: Diagnosis not present

## 2021-01-28 DIAGNOSIS — G4700 Insomnia, unspecified: Secondary | ICD-10-CM | POA: Diagnosis not present

## 2021-01-28 DIAGNOSIS — E785 Hyperlipidemia, unspecified: Secondary | ICD-10-CM

## 2021-01-28 DIAGNOSIS — E538 Deficiency of other specified B group vitamins: Secondary | ICD-10-CM

## 2021-01-28 DIAGNOSIS — M810 Age-related osteoporosis without current pathological fracture: Secondary | ICD-10-CM | POA: Diagnosis not present

## 2021-01-28 DIAGNOSIS — M8580 Other specified disorders of bone density and structure, unspecified site: Secondary | ICD-10-CM | POA: Diagnosis not present

## 2021-01-28 LAB — COMPREHENSIVE METABOLIC PANEL
ALT: 10 U/L (ref 0–35)
AST: 18 U/L (ref 0–37)
Albumin: 4.2 g/dL (ref 3.5–5.2)
Alkaline Phosphatase: 50 U/L (ref 39–117)
BUN: 16 mg/dL (ref 6–23)
CO2: 30 mEq/L (ref 19–32)
Calcium: 9.2 mg/dL (ref 8.4–10.5)
Chloride: 108 mEq/L (ref 96–112)
Creatinine, Ser: 0.75 mg/dL (ref 0.40–1.20)
GFR: 74.03 mL/min (ref >60.00)
Glucose, Bld: 97 mg/dL (ref 70–99)
Potassium: 4.3 mEq/L (ref 3.5–5.1)
Sodium: 143 mEq/L (ref 135–145)
Total Bilirubin: 0.6 mg/dL (ref 0.2–1.2)
Total Protein: 6.5 g/dL (ref 6.0–8.3)

## 2021-01-28 LAB — LIPID PANEL
Cholesterol: 172 mg/dL (ref 0–200)
HDL: 57.3 mg/dL (ref >39.00)
LDL Cholesterol: 104 mg/dL — ABNORMAL HIGH (ref 0–99)
NonHDL: 114.23
Total CHOL/HDL Ratio: 3
Triglycerides: 52 mg/dL (ref 0.0–149.0)
VLDL: 10.4 mg/dL (ref 0.0–40.0)

## 2021-01-28 LAB — CBC WITH DIFFERENTIAL/PLATELET
Basophils Absolute: 0.1 10*3/uL (ref 0.0–0.1)
Basophils Relative: 0.9 % (ref 0.0–3.0)
Eosinophils Absolute: 0.1 10*3/uL (ref 0.0–0.7)
Eosinophils Relative: 2.6 % (ref 0.0–5.0)
HCT: 43.3 % (ref 36.0–46.0)
Hemoglobin: 14.4 g/dL (ref 12.0–15.0)
Lymphocytes Relative: 22.6 % (ref 12.0–46.0)
Lymphs Abs: 1.3 10*3/uL (ref 0.7–4.0)
MCHC: 33.2 g/dL (ref 30.0–36.0)
MCV: 91 fl (ref 78.0–100.0)
Monocytes Absolute: 0.5 10*3/uL (ref 0.1–1.0)
Monocytes Relative: 9 % (ref 3.0–12.0)
Neutro Abs: 3.7 10*3/uL (ref 1.4–7.7)
Neutrophils Relative %: 64.9 % (ref 43.0–77.0)
Platelets: 173 10*3/uL (ref 150.0–400.0)
RBC: 4.76 Mil/uL (ref 3.87–5.11)
RDW: 13.6 % (ref 11.5–15.5)
WBC: 5.7 10*3/uL (ref 4.0–10.5)

## 2021-01-28 MED ORDER — CYANOCOBALAMIN 1000 MCG/ML IJ SOLN: 1000.0000 ug | INTRAMUSCULAR | 5 refills | Status: DC

## 2021-01-28 MED ORDER — "NEEDLE (DISP) 25G X 1-1/2"" MISC": 1.0000 | 5 refills | Status: DC

## 2021-01-28 MED ORDER — DENOSUMAB 60 MG/ML ~~LOC~~ SOSY
60.0000 mg | PREFILLED_SYRINGE | SUBCUTANEOUS | Status: AC
  Administered 2021-01-28 – 2023-02-14 (×3): 60 mg via SUBCUTANEOUS

## 2021-01-28 NOTE — Progress Notes (Signed)
Chief Complaint  Patient presents with  . Follow-up  . Injections    Prolia   F/u  1. b12 low 109 showing how to do B12 shots at home 2. hld on zocor weekly 10 mg and cholesterol controlled  3. Now has hearing aids with ENt   Review of Systems  Constitutional: Negative for weight loss.  HENT: Positive for hearing loss.   Eyes: Negative for blurred vision.  Respiratory: Negative for shortness of breath.   Cardiovascular: Negative for chest pain.  Gastrointestinal: Negative for abdominal pain.  Musculoskeletal: Negative for falls and joint pain.  Skin: Negative for rash.  Neurological: Negative for headaches.  Psychiatric/Behavioral: Positive for memory loss.   Past Medical History:  Diagnosis Date  . Allergic rhinitis due to pollen   . Blood present in stool   . Cancer ([place])    bcc forehead s/p Mohs Dr. Roel Cluck (of note nmsc x 2 face and 1x neck)   . Endometriosis   . Environmental and seasonal allergies   . GERD (gastroesophageal reflux disease)   . Hyperlipidemia   . Osteopenia 11/12   T -1.8 in hip  . Osteoporosis   . Sleep disturbance   . UTI (urinary tract infection)   . Vaginal atrophy    Past Surgical History:  Procedure Laterality Date  . ABDOMINAL HYSTERECTOMY     total   . APPENDECTOMY    . CATARACT EXTRACTION W/ INTRAOCULAR LENS  IMPLANT, BILATERAL    . COLONOSCOPY WITH PROPOFOL N/A 01/22/2016   Procedure: COLONOSCOPY WITH PROPOFOL;  Surgeon: Josefine Class, MD;  Location: [facility] ENDOSCOPY;  Service: Endoscopy;  Laterality: N/A;  . ROTATOR CUFF REPAIR Right ~2001  . SHOULDER ARTHROSCOPY WITH SUBACROMIAL DECOMPRESSION AND BICEP TENDON REPAIR Left 11/08/2018   Procedure: SHOULDER ARTHROSCOPY WITH DEBRIDEMENT, DECOMPRESSION AND ROTATOR CUFF REPAIR;  Surgeon: Corky Mull, MD;  Location: [facility] ORS;  Service: Orthopedics;  Laterality: Left;   Family History  Problem Relation Age of Onset  . Cancer Mother        CLL  . Cancer Father         colon  . Aneurysm Father   . Cancer Brother        colon cancer  . Heart disease Maternal Uncle   . Diabetes Neg Hx   . Breast cancer Neg Hx   . Bladder Cancer Neg Hx   . Kidney cancer Neg Hx    Social History   Socioeconomic History  . Marital status: Married    Spouse name: Kara Pacer  . Number of children: 0  . Years of education: Not on file  . Highest education level: Not on file  Occupational History  . Occupation: Scientist, water quality    Comment: Retired  Tobacco Use  . Smoking status: Former Smoker    Quit date: 10/30/1966    Years since quitting: 54.2  . Smokeless tobacco: Never Used  . Tobacco comment: as of 08/2017 quit smoking >15 years ago 1ppd x 35 years no FH lung cancer   Vaping Use  . Vaping Use: Never used  Substance and Sexual Activity  . Alcohol use: Yes    Alcohol/week: 2.0 standard drinks    Types: 2 Shots of liquor per week    Comment: drinks gin per week  . Drug use: No  . Sexual activity: Never  Other Topics Concern  . Not on file  Social History Narrative   Has living will   Husband is health care  POA.   Would accept resuscitation but no prolonged ventilation   Not sure about tube feeds   Moved her from [place]    Exercises 3x per week    No kids    Social Determinants of Health   Financial Resource Strain: Low Risk   . Difficulty of Paying Living Expenses: Not hard at all  Food Insecurity: No Food Insecurity  . Worried About Charity fundraiser in the Last Year: Never true  . Ran Out of Food in the Last Year: Never true  Transportation Needs: No Transportation Needs  . Lack of Transportation (Medical): No  . Lack of Transportation (Non-Medical): No  Physical Activity: Not on file  Stress: No Stress Concern Present  . Feeling of Stress : Not at all  Social Connections: Unknown  . Frequency of Communication with Friends and Family: Not on file  . Frequency of Social Gatherings with Friends and Family: Not on file  .  Attends Religious Services: Not on file  . Active Member of Clubs or Organizations: Not on file  . Attends Archivist Meetings: Not on file  . Marital Status: Married  Human resources officer Violence: Not At Risk  . Fear of Current or Ex-Partner: No  . Emotionally Abused: No  . Physically Abused: No  . Sexually Abused: No   Current Meds  Medication Sig  . cyanocobalamin (,VITAMIN B-12,) 1000 MCG/ML injection Inject 1 mL (1,000 mcg total) into the muscle every 30 (thirty) days.  . eszopiclone (LUNESTA) 2 MG TABS tablet Take 1 tablet (2 mg total) by mouth at bedtime as needed (for sleep.).  Marland Kitchen NEEDLE, DISP, 25 G 25G X 1-1/2" MISC 1 Device by Does not apply route every 30 (thirty) days.  . Probiotic Product (PROBIOTIC DAILY PO) Take by mouth.  . simvastatin (ZOCOR) 10 MG tablet TAKE 1 TABLET BY MOUTH EVERY MONDAY   Current Facility-Administered Medications for the 01/28/21 encounter (Office Visit) with McLean-Scocuzza, Nino Glow, MD  Medication  . denosumab (PROLIA) injection 60 mg   Allergies  Allergen Reactions  . Contrast Media [Iodinated Diagnostic Agents] Anaphylaxis   No results found for this or any previous visit (from the past 2160 hour(s)). Objective  Body mass index is 22.64 kg/m. Wt Readings from Last 3 Encounters:  01/28/21 132 lb (59.9 kg)  10/28/20 128 lb (58.1 kg)  07/20/20 129 lb (58.5 kg)   Temp Readings from Last 3 Encounters:  01/28/21 97.7 F (36.5 C) (Oral)  07/14/20 98 F (36.7 C) (Oral)  01/15/20 (!) 97 F (36.1 C) (Temporal)   BP Readings from Last 3 Encounters:  01/28/21 116/70  07/14/20 112/62  01/15/20 128/70   Pulse Readings from Last 3 Encounters:  01/28/21 70  07/14/20 77  01/15/20 65    Physical Exam Vitals and nursing note reviewed.  Constitutional:      Appearance: Normal appearance. She is well-developed and well-groomed.  HENT:     Head: Normocephalic and atraumatic.  Cardiovascular:     Rate and Rhythm: Normal rate and  regular rhythm.     Heart sounds: Normal heart sounds. No murmur heard.   Pulmonary:     Effort: Pulmonary effort is normal.     Breath sounds: Normal breath sounds.  Abdominal:     General: Abdomen is flat. Bowel sounds are normal.     Tenderness: There is no abdominal tenderness.  Skin:    General: Skin is warm and dry.  Neurological:     General: No  focal deficit present.     Mental Status: She is alert and oriented to person, place, and time. Mental status is at baseline.     Gait: Gait normal.  Psychiatric:        Attention and Perception: Attention and perception normal.        Mood and Affect: Mood and affect normal.        Speech: Speech normal.        Behavior: Behavior normal. Behavior is cooperative.        Thought Content: Thought content normal.        Cognition and Memory: Cognition and memory normal.        Judgment: Judgment normal.     Assessment  Plan  Osteoporosis, unspecified osteoporosis type, unspecified pathological fracture presence Osteopenia, unspecified location - Plan: denosumab (PROLIA) injection 60 mg prolia today   Insomnia, unspecified type Has  lunesta  B12 deficiency - Plan: NEEDLE, DISP, 25 G 25G X 1-1/2" MISC, cyanocobalamin (,VITAMIN B-12,) 1000 MCG/ML injection  Hyperlipidemia, unspecified hyperlipidemia type - Plan: Comprehensive metabolic panel, CBC with Differential/Platelet, Lipid panel zocor 10 mg weekly   Bilateral hearing loss, unspecified hearing loss type  Has hearing aids   HM Flu shot utd Hadpna vaccines x 2 -pna 23utd no need for futher Had zostervax -given Rx shingrixhad not hadwill check ncir Tdaphad 12/25/17 covid vx had 3/3 moderna pt to bring in log of thisconsider 4th dose  Out of pap window s/p total hysterectomyjust saw OB/GYN 12/2017  mammo neg4/12/21negativeorderedsch 02/03/21   dexa 2/2021osteopenia high FRAX score -4/7/21proliaordered had dose 07/30/20; next due at f/u ordered  pt needs to sch  Colonoscopy: 01/22/16 Dr. Rayann Heman h/o colon polpys. colonoscopy was difficult per report and she didn't like the MD who did the procedure. Reviewed +IH and adhesions making exam difficult.   cologuard done 02/03/16 repeat due 02/03/2019 until age 29 reviewed with pt ok with this planwill re ordered closer to date -+cologuard4/17/20 -colonoscopy 03/05/19 IH otherwise neg  Saw dermatology on [place] 06/16/17 had [place] [facility]2 f/u in 1 year, recentlysaw 06/2019  H/o nmsc x 2 face and 1 neck h/o mohs [facility] Dr. Manley Mason dermatology appt9/20/21 had Hassan Rowan wnl f/u in 1 year sch 07/20/21   She is former smoker quit >15 years ago smoked 1 ppd x 35 yeas no FH lung cancer  -consider CT low dose if insurance will cover in future   Cont exercise 3x per week  Provider: Dr. Olivia Mackie McLean-Scocuzza-Internal Medicine

## 2021-01-28 NOTE — Patient Instructions (Addendum)
Consider 4th moderna 03/08/21  Call to schedule repeat bone density at [facility]

## 2021-02-01 ENCOUNTER — Telehealth: Payer: Self-pay

## 2021-02-01 ENCOUNTER — Other Ambulatory Visit: Payer: Self-pay | Admitting: Internal Medicine

## 2021-02-01 DIAGNOSIS — G4700 Insomnia, unspecified: Secondary | ICD-10-CM

## 2021-02-01 NOTE — Telephone Encounter (Signed)
Called and spoke with the Patient. She states she would like to be taught how to do the b12 shot herself since she will be out of town. Patient states she does not want to have to find somewhere to give her shots while on vacation.   Patient scheduled to come in 02/02/21 at 11:00 am for a nurse visit to be taught how to give injections.

## 2021-02-01 NOTE — Telephone Encounter (Signed)
Pt called and states that she is going to go out of town Friday for the rest of the summer. She states that she picked up rx for cyanocobalamin (,VITAMIN B-12,) 1000 MCG/ML injection but it is only for three months and she will run out before she is back. Please advise

## 2021-02-02 ENCOUNTER — Ambulatory Visit (INDEPENDENT_AMBULATORY_CARE_PROVIDER_SITE_OTHER): Payer: Medicare Other

## 2021-02-02 ENCOUNTER — Other Ambulatory Visit: Payer: Self-pay

## 2021-02-02 DIAGNOSIS — E538 Deficiency of other specified B group vitamins: Secondary | ICD-10-CM

## 2021-02-02 MED ORDER — CYANOCOBALAMIN 1000 MCG/ML IJ SOLN
1000.0000 ug | Freq: Once | INTRAMUSCULAR | Status: AC
  Administered 2021-02-02: 1000 ug via INTRAMUSCULAR

## 2021-02-02 NOTE — Progress Notes (Signed)
Patient presented for B 12 self injection teaching. Patient was instructed on how to give injection and performed the injection to her right thigh, patient voiced no concerns nor showed any signs of distress during injection to herself.

## 2021-02-03 ENCOUNTER — Ambulatory Visit
Admission: RE | Admit: 2021-02-03 | Discharge: 2021-02-03 | Disposition: A | Discharge status: Home / Self Care | Payer: Medicare Other | Source: Ambulatory Visit | Attending: Internal Medicine | Admitting: Internal Medicine

## 2021-02-03 DIAGNOSIS — Z1231 Encounter for screening mammogram for malignant neoplasm of breast: Secondary | ICD-10-CM | POA: Diagnosis not present

## 2021-03-04 DIAGNOSIS — Z7189 Other specified counseling: Secondary | ICD-10-CM | POA: Diagnosis not present

## 2021-03-11 DIAGNOSIS — Z23 Encounter for immunization: Secondary | ICD-10-CM | POA: Diagnosis not present

## 2021-04-28 ENCOUNTER — Encounter: Payer: Self-pay | Admitting: Internal Medicine

## 2021-04-28 DIAGNOSIS — U071 COVID-19: Secondary | ICD-10-CM

## 2021-04-28 HISTORY — DX: COVID-19: U07.1

## 2021-04-28 NOTE — Telephone Encounter (Signed)
Please advise 

## 2021-04-28 NOTE — Telephone Encounter (Signed)
Can we do telephone call now for covid 19 +

## 2021-04-28 NOTE — Telephone Encounter (Signed)
Left message to return call 

## 2021-04-29 ENCOUNTER — Other Ambulatory Visit: Payer: Self-pay

## 2021-04-29 ENCOUNTER — Encounter: Payer: Self-pay | Admitting: Internal Medicine

## 2021-04-29 ENCOUNTER — Telehealth (INDEPENDENT_AMBULATORY_CARE_PROVIDER_SITE_OTHER): Payer: Medicare Other | Admitting: Internal Medicine

## 2021-04-29 VITALS — Ht 64.02 in | Wt 129.0 lb

## 2021-04-29 DIAGNOSIS — U071 COVID-19: Secondary | ICD-10-CM | POA: Diagnosis not present

## 2021-04-29 NOTE — Progress Notes (Signed)
Patient's husband was in [place] and was near a Covid positive person. Tested positive 2 days ago.   Patient taking allergy relief.

## 2021-04-29 NOTE — Telephone Encounter (Signed)
Please advise, did you want Patient seen tomorrow or today? You currently have a 4:00 pm Patient today added on. 4 pm today no longer available

## 2021-04-29 NOTE — Progress Notes (Signed)
Telephone Note  I connected with   on 04/29/21 at 11:45 AM EDT by telephone and verified that I am speaking with the correct person using two identifiers.  Location patient: home, [place] Location provider:work or home office Persons participating in the virtual visit: patient, provider  I discussed the limitations of evaluation and management by telemedicine and the availability of in person appointments. The patient expressed understanding and agreed to proceed.   HPI:  Acute telemedicine visit for : Covid 19 + x 2 days contracted from husband who got from [employer] in [employer] lives [place] sx's 4/10 sore throat, low grade fever 04/28/21 99.2 F, no cough  Tried tea declines covid 19 pill   -COVID-19 vaccine status: 4/4 moderna  ROS: See pertinent positives and negatives per HPI.  Past Medical History:  Diagnosis Date   Allergic rhinitis due to pollen    Blood present in stool    Cancer ([place])    bcc forehead s/p Mohs Dr. Roel Cluck (of note [facility] x 2 face and 1x neck)    COVID-19 04/28/2021   Endometriosis    Environmental and seasonal allergies    GERD (gastroesophageal reflux disease)    Hyperlipidemia    Osteopenia 08/2011   T -1.8 in hip   Osteoporosis    Sleep disturbance    UTI (urinary tract infection)    Vaginal atrophy     Past Surgical History:  Procedure Laterality Date   ABDOMINAL HYSTERECTOMY     total    APPENDECTOMY     CATARACT EXTRACTION W/ INTRAOCULAR LENS  IMPLANT, BILATERAL     COLONOSCOPY WITH PROPOFOL N/A 01/22/2016   Procedure: COLONOSCOPY WITH PROPOFOL;  Surgeon: Josefine Class, MD;  Location: [facility] ENDOSCOPY;  Service: Endoscopy;  Laterality: N/A;   ROTATOR CUFF REPAIR Right ~2001   SHOULDER ARTHROSCOPY WITH SUBACROMIAL DECOMPRESSION AND BICEP TENDON REPAIR Left 11/08/2018   Procedure: SHOULDER ARTHROSCOPY WITH DEBRIDEMENT, DECOMPRESSION AND ROTATOR CUFF REPAIR;  Surgeon: Corky Mull, MD;  Location: [facility] ORS;  Service:  Orthopedics;  Laterality: Left;     Current Outpatient Medications:    cyanocobalamin (,VITAMIN B-12,) 1000 MCG/ML injection, Inject 1 mL (1,000 mcg total) into the muscle every 30 (thirty) days., Disp: 12 mL, Rfl: 5   eszopiclone (LUNESTA) 2 MG TABS tablet, TAKE 1 TABLET(2 MG) BY MOUTH AT BEDTIME AS NEEDED FOR SLEEP, Disp: 15 tablet, Rfl: 0   NEEDLE, DISP, 25 G 25G X 1-1/2" MISC, 1 Device by Does not apply route every 30 (thirty) days., Disp: 12 each, Rfl: 5   simvastatin (ZOCOR) 10 MG tablet, TAKE 1 TABLET BY MOUTH EVERY MONDAY, Disp: 15 tablet, Rfl: 11   Probiotic Product (PROBIOTIC DAILY PO), Take by mouth. (Patient not taking: Reported on 04/29/2021), Disp: , Rfl:   Current Facility-Administered Medications:    denosumab (PROLIA) injection 60 mg, 60 mg, Subcutaneous, Q6 months, McLean-Scocuzza, Nino Glow, MD, 60 mg at 01/28/21 0931  EXAM:  VITALS per patient if applicable:  GENERAL: alert, oriented, appears well and in no acute distress  PSYCH/NEURO: pleasant and cooperative, no obvious depression or anxiety, speech and thought processing grossly intact  ASSESSMENT AND PLAN:  Discussed the following assessment and plan:  COVID-19 Declines covid 19 pill and zpack will back if worsening  These are over the counter medication options:  Mucinex dm green label for cough.  Vitamin C 1000 mg daily.  Vitamin D3 4000 Iu (units) daily.  Zinc 100 mg daily.  Quercetin 250-500  mg 2 times per day   Elderberry  Oil of oregano  cepacol or chloroseptic spray  Warm tea with honey and lemon  Hydration  Try to eat though you dont feel like it   Tylenol or Advil  Nasal saline  Flonase   Monitor pulse oximeter, buy from [place] if oxygen is less than 90 please go to the hospital.   Are you feeling really sick? Shortness of breath, cough, chest pain?, dizziness? Confusion   If so let me know  If worsening, go to hospital or [facility] clinic Urgent care for further treatment.    -we discussed  possible serious and likely etiologies, options for evaluation and workup, limitations of telemedicine visit vs in person visit, treatment, treatment risks and precautions. Pt prefers to treat via telemedicine empirically rather than in person at this moment.   I discussed the assessment and treatment plan with the patient. The patient was provided an opportunity to ask questions and all were answered. The patient agreed with the plan and demonstrated an understanding of the instructions.    Time spent [place], MD

## 2021-04-29 NOTE — Telephone Encounter (Signed)
PT called to schedule apt for telephone call for Covid 19 +. Nothing is available till next Friday on Dr.TMS schedule. Please advise as PT is wanting to be seen either today or tomorrow via telephone call.

## 2021-04-29 NOTE — Telephone Encounter (Signed)
Patient scheduled for 11:45 telephone visit today

## 2021-04-29 NOTE — Telephone Encounter (Signed)
PT called to return the missed call 

## 2021-05-04 NOTE — Telephone Encounter (Signed)
I expect her to possibly test + can test + for days or weeks  Quarantine for 14-21 days in the home  Is she wanting medication Rx how is she feeling?

## 2021-05-04 NOTE — Telephone Encounter (Signed)
PT called to state she is still testing positive and would like to know what Dr.TMS recommends to do do in order to getting better.

## 2021-05-04 NOTE — Telephone Encounter (Signed)
Please advise 

## 2021-05-05 ENCOUNTER — Encounter: Payer: Self-pay | Admitting: Internal Medicine

## 2021-05-06 NOTE — Telephone Encounter (Signed)
For your information on how Patient is feeling

## 2021-05-06 NOTE — Telephone Encounter (Signed)
See new Patient message encounter

## 2021-06-28 DIAGNOSIS — Z20822 Contact with and (suspected) exposure to covid-19: Secondary | ICD-10-CM | POA: Diagnosis not present

## 2021-07-12 ENCOUNTER — Ambulatory Visit: Payer: Medicare Other

## 2021-07-20 ENCOUNTER — Telehealth: Payer: Self-pay | Admitting: Internal Medicine

## 2021-07-20 DIAGNOSIS — Z85828 Personal history of other malignant neoplasm of skin: Secondary | ICD-10-CM | POA: Diagnosis not present

## 2021-07-20 DIAGNOSIS — D2272 Melanocytic nevi of left lower limb, including hip: Secondary | ICD-10-CM | POA: Diagnosis not present

## 2021-07-20 DIAGNOSIS — D2261 Melanocytic nevi of right upper limb, including shoulder: Secondary | ICD-10-CM | POA: Diagnosis not present

## 2021-07-20 DIAGNOSIS — L538 Other specified erythematous conditions: Secondary | ICD-10-CM | POA: Diagnosis not present

## 2021-07-20 DIAGNOSIS — L820 Inflamed seborrheic keratosis: Secondary | ICD-10-CM | POA: Diagnosis not present

## 2021-07-20 DIAGNOSIS — D2262 Melanocytic nevi of left upper limb, including shoulder: Secondary | ICD-10-CM | POA: Diagnosis not present

## 2021-07-20 NOTE — Telephone Encounter (Signed)
Patient scheduled.

## 2021-07-21 ENCOUNTER — Ambulatory Visit: Payer: Medicare Other

## 2021-07-22 ENCOUNTER — Ambulatory Visit: Payer: Medicare Other

## 2021-07-30 ENCOUNTER — Other Ambulatory Visit: Payer: Self-pay

## 2021-07-30 ENCOUNTER — Ambulatory Visit (INDEPENDENT_AMBULATORY_CARE_PROVIDER_SITE_OTHER): Payer: Medicare Other

## 2021-07-30 DIAGNOSIS — M8580 Other specified disorders of bone density and structure, unspecified site: Secondary | ICD-10-CM | POA: Diagnosis not present

## 2021-07-30 DIAGNOSIS — Z23 Encounter for immunization: Secondary | ICD-10-CM | POA: Diagnosis not present

## 2021-07-30 MED ORDER — DENOSUMAB 60 MG/ML ~~LOC~~ SOSY
60.0000 mg | PREFILLED_SYRINGE | Freq: Once | SUBCUTANEOUS | Status: AC
  Administered 2021-07-30: 60 mg via SUBCUTANEOUS

## 2021-07-30 NOTE — Progress Notes (Signed)
Pt presented for a prolia injection to the right arm. Pt voiced no concerns or discomfort during time of injection.

## 2021-08-11 ENCOUNTER — Ambulatory Visit: Payer: Medicare Other | Admitting: Internal Medicine

## 2021-08-20 ENCOUNTER — Telehealth: Payer: Self-pay | Admitting: Internal Medicine

## 2021-08-20 NOTE — Telephone Encounter (Signed)
Copied from [place] [id number]. Topic: Medicare AWV >> Aug 20, 2021 11:06 AM Harris-Coley, Hannah Beat wrote: Reason for CRM: LVM 08/20/21 @11 :00am to r/s AWV 08/26/21 appt [facility]

## 2021-08-26 ENCOUNTER — Ambulatory Visit: Payer: Medicare Other

## 2021-08-26 ENCOUNTER — Telehealth: Payer: Medicare Other | Admitting: Family Medicine

## 2021-08-26 NOTE — Progress Notes (Signed)
Called pt at 4:30 pm to prepare for virtual visit, no answer. Left message on vm

## 2021-08-27 ENCOUNTER — Telehealth: Payer: Self-pay | Admitting: Internal Medicine

## 2021-08-27 NOTE — Telephone Encounter (Signed)
Called Pt left message for patient to call us back to schedule medicare well visit appt.

## 2021-09-08 ENCOUNTER — Telehealth: Payer: Self-pay | Admitting: Internal Medicine

## 2021-09-08 ENCOUNTER — Ambulatory Visit (INDEPENDENT_AMBULATORY_CARE_PROVIDER_SITE_OTHER): Payer: Medicare Other | Admitting: Internal Medicine

## 2021-09-08 ENCOUNTER — Other Ambulatory Visit: Payer: Self-pay

## 2021-09-08 ENCOUNTER — Encounter: Payer: Self-pay | Admitting: Internal Medicine

## 2021-09-08 VITALS — BP 110/60 | HR 87 | Temp 97.3°F | Ht 64.02 in | Wt 128.8 lb

## 2021-09-08 DIAGNOSIS — E538 Deficiency of other specified B group vitamins: Secondary | ICD-10-CM

## 2021-09-08 DIAGNOSIS — R110 Nausea: Secondary | ICD-10-CM | POA: Diagnosis not present

## 2021-09-08 DIAGNOSIS — G4700 Insomnia, unspecified: Secondary | ICD-10-CM

## 2021-09-08 DIAGNOSIS — K8020 Calculus of gallbladder without cholecystitis without obstruction: Secondary | ICD-10-CM | POA: Diagnosis not present

## 2021-09-08 DIAGNOSIS — M810 Age-related osteoporosis without current pathological fracture: Secondary | ICD-10-CM

## 2021-09-08 DIAGNOSIS — Z1329 Encounter for screening for other suspected endocrine disorder: Secondary | ICD-10-CM | POA: Diagnosis not present

## 2021-09-08 DIAGNOSIS — Z1231 Encounter for screening mammogram for malignant neoplasm of breast: Secondary | ICD-10-CM | POA: Diagnosis not present

## 2021-09-08 DIAGNOSIS — Z0000 Encounter for general adult medical examination without abnormal findings: Secondary | ICD-10-CM

## 2021-09-08 DIAGNOSIS — M25559 Pain in unspecified hip: Secondary | ICD-10-CM | POA: Diagnosis not present

## 2021-09-08 DIAGNOSIS — Z23 Encounter for immunization: Secondary | ICD-10-CM | POA: Diagnosis not present

## 2021-09-08 DIAGNOSIS — R5383 Other fatigue: Secondary | ICD-10-CM | POA: Diagnosis not present

## 2021-09-08 DIAGNOSIS — E785 Hyperlipidemia, unspecified: Secondary | ICD-10-CM | POA: Diagnosis not present

## 2021-09-08 MED ORDER — ESZOPICLONE 2 MG PO TABS: ORAL_TABLET | ORAL | 2 refills | Status: DC

## 2021-09-08 MED ORDER — SIMVASTATIN 10 MG PO TABS: ORAL_TABLET | ORAL | 11 refills | Status: DC

## 2021-09-08 MED ORDER — ZOSTER VAC RECOMB ADJUVANTED 50 MCG/0.5ML IM SUSR: 0.5000 mL | Freq: Once | INTRAMUSCULAR | Status: AC

## 2021-09-08 NOTE — Patient Instructions (Addendum)
Consider 5th moderna  If shoulder continues to bother call Dr. Roland Rack for an appointment  If hip bothers Xray here if needed call us back for appointment   [facility]   [place], [place] [id number]   [phone]   Poggi, Smith Mince, MD   [place]   [facility] [address], [place] [address]   [phone]   [phone] (Fax   Hip Exercises Ask your health care provider which exercises are safe for you. Do exercises exactly as told by your health care provider and adjust them as directed. It is normal to feel mild stretching, pulling, tightness, or discomfort as you do these exercises. Stop right away if you feel sudden pain or your pain gets worse. Do not begin these exercises until told by your health care provider. Stretching and range-of-motion exercises These exercises warm up your muscles and joints and improve the movement and flexibility of your hip. These exercises also help to relieve pain, numbness, and tingling. You may be asked to limit your range of motion if you had a hip replacement. Talk to your health care provider about these restrictions. Hamstrings, supine  Lie on your back (supine position). Loop a belt or towel over the ball of your left / right foot. The ball of your foot is on the walking surface, right under your toes. Straighten your left / right knee and slowly pull on the belt or towel to raise your leg until you feel a gentle stretch behind your knee (hamstring). Do not let your knee bend while you do this. Keep your other leg flat on the floor. Hold this position for __________ seconds. Slowly return your leg to the starting position. Repeat __________ times. Complete this exercise __________ times a day. Hip rotation  Lie on your back on a firm surface. With your left / right hand, gently pull your left / right knee toward the shoulder that is on the same side of the body. Stop when your knee is pointing toward  the ceiling. Hold your left / right ankle with your other hand. Keeping your knee steady, gently pull your left / right ankle toward your other shoulder until you feel a stretch in your buttocks. Keep your hips and shoulders firmly planted while you do this stretch. Hold this position for __________ seconds. Repeat __________ times. Complete this exercise __________ times a day. Seated stretch This exercise is sometimes called hamstrings and adductors stretch. Sit on the floor with your legs stretched wide. Keep your knees straight during this exercise. Keeping your head and back in a straight line, bend at your waist to reach for your left foot (position A). You should feel a stretch in your right inner thigh (adductors). Hold this position for __________ seconds. Then slowly return to the upright position. Keeping your head and back in a straight line, bend at your waist to reach forward (position B). You should feel a stretch behind both of your thighs and knees (hamstrings). Hold this position for __________ seconds. Then slowly return to the upright position. Keeping your head and back in a straight line, bend at your waist to reach for your right foot (position C). You should feel a stretch in your left inner thigh (adductors). Hold this position for __________ seconds. Then slowly return to the upright position. Repeat __________ times. Complete this exercise __________ times a day. Lunge This exercise stretches the muscles of the hip (hip flexors). Place your  left / right knee on the floor and bend your other knee so that is directly over your ankle. You should be half-kneeling. Keep good posture with your head over your shoulders. Tighten your buttocks to point your tailbone downward. This will prevent your back from arching too much. You should feel a gentle stretch in the front of your left / right thigh and hip. If you do not feel a stretch, slide your other foot forward slightly and  then slowly lunge forward with your chest up until your knee once again lines up over your ankle. Make sure your tailbone continues to point downward. Hold this position for __________ seconds. Slowly return to the starting position. Repeat __________ times. Complete this exercise __________ times a day. Strengthening exercises These exercises build strength and endurance in your hip. Endurance is the ability to use your muscles for a long time, even after they get tired. Bridge This exercise strengthens the muscles of your hip (hip extensors). Lie on your back on a firm surface with your knees bent and your feet flat on the floor. Tighten your buttocks muscles and lift your bottom off the floor until the trunk of your body and your hips are level with your thighs. Do not arch your back. You should feel the muscles working in your buttocks and the back of your thighs. If you do not feel these muscles, slide your feet 1-2 inches (2.5-5 cm) farther away from your buttocks. Hold this position for __________ seconds. Slowly lower your hips to the starting position. Let your muscles relax completely between repetitions. Repeat __________ times. Complete this exercise __________ times a day. Straight leg raises, side-lying This exercise strengthens the muscles that move the hip joint away from the center of the body (hip abductors). Lie on your side with your left / right leg in the top position. Lie so your head, shoulder, hip, and knee line up. You may bend your bottom knee slightly to help you balance. Roll your hips slightly forward, so your hips are stacked directly over each other and your left / right knee is facing forward. Leading with your heel, lift your top leg 4-6 inches (10-15 cm). You should feel the muscles in your top hip lifting. Do not let your foot drift forward. Do not let your knee roll toward the ceiling. Hold this position for __________ seconds. Slowly return to the  starting position. Let your muscles relax completely between repetitions. Repeat __________ times. Complete this exercise __________ times a day. Straight leg raises, side-lying This exercise strengthens the muscles that move the hip joint toward the center of the body (hip adductors). Lie on your side with your left / right leg in the bottom position. Lie so your head, shoulder, hip, and knee line up. You may place your upper foot in front to help you balance. Roll your hips slightly forward, so your hips are stacked directly over each other and your left / right knee is facing forward. Tense the muscles in your inner thigh and lift your bottom leg 4-6 inches (10-15 cm). Hold this position for __________ seconds. Slowly return to the starting position. Let your muscles relax completely between repetitions. Repeat __________ times. Complete this exercise __________ times a day. Straight leg raises, supine This exercise strengthens the muscles in the front of your thigh (quadriceps). Lie on your back (supine position) with your left / right leg extended and your other knee bent. Tense the muscles in the front of your left /  right thigh. You should see your kneecap slide up or see increased dimpling just above your knee. Keep these muscles tight as you raise your leg 4-6 inches (10-15 cm) off the floor. Do not let your knee bend. Hold this position for __________ seconds. Keep these muscles tense as you lower your leg. Relax the muscles slowly and completely between repetitions. Repeat __________ times. Complete this exercise __________ times a day. Hip abductors, standing This exercise strengthens the muscles that move the leg and hip joint away from the center of the body (hip abductors). Tie one end of a rubber exercise band or tubing to a secure surface, such as a chair, table, or pole. Loop the other end of the band or tubing around your left / right ankle. Keeping your ankle with the  band or tubing directly opposite the secured end, step away until there is tension in the tubing or band. Hold on to a chair, table, or pole as needed for balance. Lift your left / right leg out to your side. While you do this: Keep your back upright. Keep your shoulders over your hips. Keep your toes pointing forward. Make sure to use your hip muscles to slowly lift your leg. Do not tip your body or forcefully lift your leg. Hold this position for __________ seconds. Slowly return to the starting position. Repeat __________ times. Complete this exercise __________ times a day. Squats This exercise strengthens the muscles in the front of your thigh (quadriceps). Stand in a door frame so your feet and knees are in line with the frame. You may place your hands on the frame for balance. Slowly bend your knees and lower your hips like you are going to sit in a chair. Keep your lower legs in a straight-up-and-down position. Do not let your hips go lower than your knees. Do not bend your knees lower than told by your health care provider. If your hip pain increases, do not bend as low. Hold this position for ___________ seconds. Slowly push with your legs to return to standing. Do not use your hands to pull yourself to standing. Repeat __________ times. Complete this exercise __________ times a day. This information is not intended to replace advice given to you by your health care provider. Make sure you discuss any questions you have with your health care provider. Document Revised: 02/24/2021 Document Reviewed: 02/24/2021 Elsevier Patient Education  [place].  Shoulder Exercises Ask your health care provider which exercises are safe for you. Do exercises exactly as told by your health care provider and adjust them as directed. It is normal to feel mild stretching, pulling, tightness, or discomfort as you do these exercises. Stop right away if you feel sudden pain or your pain gets  worse. Do not begin these exercises until told by your health care provider. Stretching exercises External rotation and abduction This exercise is sometimes called corner stretch. This exercise rotates your arm outward (external rotation) and moves your arm out from your body (abduction). Stand in a doorway with one of your feet slightly in front of the other. This is called a staggered stance. If you cannot reach your forearms to the door frame, stand facing a corner of a room. Choose one of the following positions as told by your health care provider: Place your hands and forearms on the door frame above your head. Place your hands and forearms on the door frame at the height of your head. Place your hands on the  door frame at the height of your elbows. Slowly move your weight onto your front foot until you feel a stretch across your chest and in the front of your shoulders. Keep your head and chest upright and keep your abdominal muscles tight. Hold for __________ seconds. To release the stretch, shift your weight to your back foot. Repeat __________ times. Complete this exercise __________ times a day. Extension, standing Stand and hold a broomstick, a cane, or a similar object behind your back. Your hands should be a little wider than shoulder width apart. Your palms should face away from your back. Keeping your elbows straight and your shoulder muscles relaxed, move the stick away from your body until you feel a stretch in your shoulders (extension). Avoid shrugging your shoulders while you move the stick. Keep your shoulder blades tucked down toward the middle of your back. Hold for __________ seconds. Slowly return to the starting position. Repeat __________ times. Complete this exercise __________ times a day. Range-of-motion exercises Pendulum  Stand near a wall or a surface that you can hold onto for balance. Bend at the waist and let your left / right arm hang straight down. Use  your other arm to support you. Keep your back straight and do not lock your knees. Relax your left / right arm and shoulder muscles, and move your hips and your trunk so your left / right arm swings freely. Your arm should swing because of the motion of your body, not because you are using your arm or shoulder muscles. Keep moving your hips and trunk so your arm swings in the following directions, as told by your health care provider: Side to side. Forward and backward. In clockwise and counterclockwise circles. Continue each motion for __________ seconds, or for as long as told by your health care provider. Slowly return to the starting position. Repeat __________ times. Complete this exercise __________ times a day. Shoulder flexion, standing  Stand and hold a broomstick, a cane, or a similar object. Place your hands a little more than shoulder width apart on the object. Your left / right hand should be palm up, and your other hand should be palm down. Keep your elbow straight and your shoulder muscles relaxed. Push the stick up with your healthy arm to raise your left / right arm in front of your body, and then over your head until you feel a stretch in your shoulder (flexion). Avoid shrugging your shoulder while you raise your arm. Keep your shoulder blade tucked down toward the middle of your back. Hold for __________ seconds. Slowly return to the starting position. Repeat __________ times. Complete this exercise __________ times a day. Shoulder abduction, standing Stand and hold a broomstick, a cane, or a similar object. Place your hands a little more than shoulder width apart on the object. Your left / right hand should be palm up, and your other hand should be palm down. Keep your elbow straight and your shoulder muscles relaxed. Push the object across your body toward your left / right side. Raise your left / right arm to the side of your body (abduction) until you feel a stretch in your  shoulder. Do not raise your arm above shoulder height unless your health care provider tells you to do that. If directed, raise your arm over your head. Avoid shrugging your shoulder while you raise your arm. Keep your shoulder blade tucked down toward the middle of your back. Hold for __________ seconds. Slowly return to the  starting position. Repeat __________ times. Complete this exercise __________ times a day. Internal rotation  Place your left / right hand behind your back, palm up. Use your other hand to dangle an exercise band, a towel, or a similar object over your shoulder. Grasp the band with your left / right hand so you are holding on to both ends. Gently pull up on the band until you feel a stretch in the front of your left / right shoulder. The movement of your arm toward the center of your body is called internal rotation. Avoid shrugging your shoulder while you raise your arm. Keep your shoulder blade tucked down toward the middle of your back. Hold for __________ seconds. Release the stretch by letting go of the band and lowering your hands. Repeat __________ times. Complete this exercise __________ times a day. Strengthening exercises External rotation  Sit in a stable chair without armrests. Secure an exercise band to a stable object at elbow height on your left / right side. Place a soft object, such as a folded towel or a small pillow, between your left / right upper arm and your body to move your elbow about 4 inches (10 cm) away from your side. Hold the end of the exercise band so it is tight and there is no slack. Keeping your elbow pressed against the soft object, slowly move your forearm out, away from your abdomen (external rotation). Keep your body steady so only your forearm moves. Hold for __________ seconds. Slowly return to the starting position. Repeat __________ times. Complete this exercise __________ times a day. Shoulder abduction  Sit in a stable  chair without armrests, or stand up. Hold a __________ weight in your left / right hand, or hold an exercise band with both hands. Start with your arms straight down and your left / right palm facing in, toward your body. Slowly lift your left / right hand out to your side (abduction). Do not lift your hand above shoulder height unless your health care provider tells you that this is safe. Keep your arms straight. Avoid shrugging your shoulder while you do this movement. Keep your shoulder blade tucked down toward the middle of your back. Hold for __________ seconds. Slowly lower your arm, and return to the starting position. Repeat __________ times. Complete this exercise __________ times a day. Shoulder extension Sit in a stable chair without armrests, or stand up. Secure an exercise band to a stable object in front of you so it is at shoulder height. Hold one end of the exercise band in each hand. Your palms should face each other. Straighten your elbows and lift your hands up to shoulder height. Step back, away from the secured end of the exercise band, until the band is tight and there is no slack. Squeeze your shoulder blades together as you pull your hands down to the sides of your thighs (extension). Stop when your hands are straight down by your sides. Do not let your hands go behind your body. Hold for __________ seconds. Slowly return to the starting position. Repeat __________ times. Complete this exercise __________ times a day. Shoulder row Sit in a stable chair without armrests, or stand up. Secure an exercise band to a stable object in front of you so it is at waist height. Hold one end of the exercise band in each hand. Position your palms so that your thumbs are facing the ceiling (neutral position). Bend each of your elbows to a 90-degree angle (right  angle) and keep your upper arms at your sides. Step back until the band is tight and there is no slack. Slowly pull your  elbows back behind you. Hold for __________ seconds. Slowly return to the starting position. Repeat __________ times. Complete this exercise __________ times a day. Shoulder press-ups  Sit in a stable chair that has armrests. Sit upright, with your feet flat on the floor. Put your hands on the armrests so your elbows are bent and your fingers are pointing forward. Your hands should be about even with the sides of your body. Push down on the armrests and use your arms to lift yourself off the chair. Straighten your elbows and lift yourself up as much as you comfortably can. Move your shoulder blades down, and avoid letting your shoulders move up toward your ears. Keep your feet on the ground. As you get stronger, your feet should support less of your body weight as you lift yourself up. Hold for __________ seconds. Slowly lower yourself back into the chair. Repeat __________ times. Complete this exercise __________ times a day. Wall push-ups  Stand so you are facing a stable wall. Your feet should be about one arm-length away from the wall. Lean forward and place your palms on the wall at shoulder height. Keep your feet flat on the floor as you bend your elbows and lean forward toward the wall. Hold for __________ seconds. Straighten your elbows to push yourself back to the starting position. Repeat __________ times. Complete this exercise __________ times a day. This information is not intended to replace advice given to you by your health care provider. Make sure you discuss any questions you have with your health care provider. Document Revised: 02/01/2019 Document Reviewed: 11/09/2018 Elsevier Patient Education  [place].

## 2021-09-08 NOTE — Telephone Encounter (Signed)
Lft pt vm to call ofc to [facility]. thanks ?

## 2021-09-08 NOTE — Progress Notes (Signed)
Chief Complaint  Patient presents with   Follow-up   Annual  1. Sleeping with lunesta 2 mg qhs prn  2. Hld controlled on zocor 10 mg qhs  2 2 weeks ago had nausea x 30 seconds w/o vomiting, back pain right lower to mid ab pain resolved and fatigue which lasted x 4 days h/o 1.6 cm gallstone    Review of Systems  Constitutional:  Positive for malaise/fatigue. Negative for weight loss.  HENT:  Negative for hearing loss.   Eyes:  Negative for blurred vision.  Respiratory:  Negative for shortness of breath.   Cardiovascular:  Negative for chest pain.  Gastrointestinal:  Positive for nausea. Negative for abdominal pain and blood in stool.  Genitourinary:  Negative for dysuria.  Musculoskeletal:  Positive for back pain. Negative for falls and joint pain.  Skin:  Negative for rash.  Neurological:  Negative for headaches.  Psychiatric/Behavioral:  Negative for depression.   Past Medical History:  Diagnosis Date   Allergic rhinitis due to pollen    Blood present in stool    Cancer ([place])    bcc forehead s/p Mohs Dr. Roel Cluck (of note nmsc x 2 face and 1x neck)    COVID-19 04/28/2021   [place] mild sxs   Endometriosis    Environmental and seasonal allergies    GERD (gastroesophageal reflux disease)    Hyperlipidemia    Osteopenia 08/2011   T -1.8 in hip   Osteoporosis    Sleep disturbance    UTI (urinary tract infection)    Vaginal atrophy    Past Surgical History:  Procedure Laterality Date   ABDOMINAL HYSTERECTOMY     total    APPENDECTOMY     CATARACT EXTRACTION W/ INTRAOCULAR LENS  IMPLANT, BILATERAL     COLONOSCOPY WITH PROPOFOL N/A 01/22/2016   Procedure: COLONOSCOPY WITH PROPOFOL;  Surgeon: Josefine Class, MD;  Location: [facility] ENDOSCOPY;  Service: Endoscopy;  Laterality: N/A;   ROTATOR CUFF REPAIR Right ~2001   SHOULDER ARTHROSCOPY WITH SUBACROMIAL DECOMPRESSION AND BICEP TENDON REPAIR Left 11/08/2018   Procedure: SHOULDER ARTHROSCOPY WITH DEBRIDEMENT,  DECOMPRESSION AND ROTATOR CUFF REPAIR;  Surgeon: Corky Mull, MD;  Location: [facility] ORS;  Service: Orthopedics;  Laterality: Left;   Family History  Problem Relation Age of Onset   Cancer Mother        CLL   Cancer Father        colon   Aneurysm Father    Cancer Brother        colon cancer   Heart disease Maternal Uncle    Diabetes Neg Hx    Breast cancer Neg Hx    Bladder Cancer Neg Hx    Kidney cancer Neg Hx    Social History   Socioeconomic History   Marital status: Married    Spouse name: Douglass   Number of children: 0   Years of education: Not on file   Highest education level: Not on file  Occupational History   Occupation: Scientist, water quality    Comment: Retired  Tobacco Use   Smoking status: Former    Types: Cigarettes    Quit date: 10/30/1966    Years since quitting: 54.8   Smokeless tobacco: Never   Tobacco comments:    as of 08/2017 quit smoking >15 years ago 1ppd x 35 years no FH lung cancer   Vaping Use   Vaping Use: Never used  Substance and Sexual Activity   Alcohol use: Yes  Alcohol/week: 2.0 standard drinks    Types: 2 Shots of liquor per week    Comment: drinks gin per week   Drug use: No   Sexual activity: Never  Other Topics Concern   Not on file  Social History Narrative   Has living will   Husband is health care POA.   Would accept resuscitation but no prolonged ventilation   Not sure about tube feeds   Moved her from [place]    Exercises 3x per week    No kids    Social Determinants of Health   Financial Resource Strain: Not on file  Food Insecurity: Not on file  Transportation Needs: Not on file  Physical Activity: Not on file  Stress: Not on file  Social Connections: Not on file  Intimate Partner Violence: Not on file   Current Meds  Medication Sig   cyanocobalamin (,VITAMIN B-12,) 1000 MCG/ML injection Inject 1 mL (1,000 mcg total) into the muscle every 30 (thirty) days.   NEEDLE, DISP, 25 G 25G X  1-1/2" MISC 1 Device by Does not apply route every 30 (thirty) days.   Zoster Vaccine Adjuvanted [facility]) injection Inject 0.5 mLs into the muscle once for 1 dose. X 2 doses 2nd dose less than 6 months of 1st   [DISCONTINUED] eszopiclone (LUNESTA) 2 MG TABS tablet TAKE 1 TABLET(2 MG) BY MOUTH AT BEDTIME AS NEEDED FOR SLEEP   [DISCONTINUED] simvastatin (ZOCOR) 10 MG tablet TAKE 1 TABLET BY MOUTH EVERY MONDAY   Current Facility-Administered Medications for the 09/08/21 encounter (Office Visit) with McLean-Scocuzza, Nino Glow, MD  Medication   denosumab (PROLIA) injection 60 mg   Allergies  Allergen Reactions   Contrast Media [Iodinated Diagnostic Agents] Anaphylaxis   No results found for this or any previous visit (from the past 2160 hour(s)). Objective  Body mass index is 22.09 kg/m. Wt Readings from Last 3 Encounters:  09/08/21 128 lb 12.8 oz (58.4 kg)  04/29/21 129 lb (58.5 kg)  01/28/21 132 lb (59.9 kg)   Temp Readings from Last 3 Encounters:  09/08/21 (!) 97.3 F (36.3 C) (Temporal)  01/28/21 97.7 F (36.5 C) (Oral)  07/14/20 98 F (36.7 C) (Oral)   BP Readings from Last 3 Encounters:  09/08/21 110/60  01/28/21 116/70  07/14/20 112/62   Pulse Readings from Last 3 Encounters:  09/08/21 87  01/28/21 70  07/14/20 77    Physical Exam Vitals and nursing note reviewed.  Constitutional:      Appearance: Normal appearance. She is well-developed and well-groomed.  HENT:     Head: Normocephalic and atraumatic.  Eyes:     Conjunctiva/sclera: Conjunctivae normal.     Pupils: Pupils are equal, round, and reactive to light.  Cardiovascular:     Rate and Rhythm: Normal rate and regular rhythm.     Heart sounds: Normal heart sounds. No murmur heard. Pulmonary:     Effort: Pulmonary effort is normal.     Breath sounds: Normal breath sounds.  Abdominal:     General: Abdomen is flat. Bowel sounds are normal.     Tenderness: There is no abdominal tenderness.   Musculoskeletal:        General: No tenderness.  Skin:    General: Skin is warm and dry.  Neurological:     General: No focal deficit present.     Mental Status: She is alert and oriented to person, place, and time. Mental status is at baseline.     Cranial Nerves: Cranial nerves 2-12  are intact.     Gait: Gait is intact.  Psychiatric:        Attention and Perception: Attention and perception normal.        Mood and Affect: Mood and affect normal.        Speech: Speech normal.        Behavior: Behavior normal. Behavior is cooperative.        Thought Content: Thought content normal.        Cognition and Memory: Cognition and memory normal.        Judgment: Judgment normal.    Assessment  Plan  Annual physical exam  Calculus of gallbladder without cholecystitis without obstruction 1.6 cm with h/o nausea- Plan: US Abdomen Complete  Insomnia, unspecified type - Plan: eszopiclone (LUNESTA) 2 MG TABS tablet  Hyperlipidemia, unspecified hyperlipidemia type - Plan: Lipid panel, simvastatin (ZOCOR) 10 MG tablet  Hip pain left  If still bothersome rec Xray left hip  Established with [facility]   HM Flu shot utd  Had pna vaccines x 2  -pna 23  utd no need for futher Had zostervax  -given Rx shingrix x 2 doses  Tdap had 12/25/17   covid vx had 4/4 moderna consider 5th dose   Out of pap window s/p total hysterectomy just saw OB/GYN 12/2017   mammo neg 02/03/20 negative ordered sch 02/03/21 , ordered 2023   dexa 11/2019 osteopenia high FRAX score - 01/29/20 prolia ordered had dose 07/30/20; ordered 2023    Colonoscopy: 01/22/16 Dr. Rayann Heman h/o colon polpys.  colonoscopy was difficult per report and she didn't like the MD who did the procedure. Reviewed +IH and adhesions making exam difficult.    cologuard done 02/03/16 repeat due 02/03/2019 until age 81 reviewed with pt ok with this plan will re ordered closer to date  -+cologuard 02/08/19  -colonoscopy 03/05/19 IH otherwise neg   Saw  dermatology on [place] 06/16/17 had [place] [facility]2 f/u in 1 year, recently saw 06/2019  H/o nmsc x 2 face and 1 neck h/o mohs [facility] Dr. Manley Mason   dermatology appt 07/13/20 had Hassan Rowan wnl f/u in 1 year sch 07/20/21    She is former smoker quit >15 years ago smoked 1 ppd x 35 yeas no FH lung cancer  -consider CT low dose if insurance will cover in future      Cont exercise 3x per week  Provider: Dr. Olivia Mackie McLean-Scocuzza-Internal Medicine

## 2021-09-10 ENCOUNTER — Telehealth: Payer: Self-pay | Admitting: Internal Medicine

## 2021-09-10 NOTE — Telephone Encounter (Signed)
Lft vm for pt to call ofc to sch [place]. thanks

## 2021-09-13 ENCOUNTER — Telehealth: Payer: Self-pay | Admitting: Internal Medicine

## 2021-09-13 NOTE — Telephone Encounter (Signed)
Lft pt vm to call ofc to sch US abdomen. thanks 

## 2021-09-22 ENCOUNTER — Telehealth: Payer: Self-pay | Admitting: Internal Medicine

## 2021-09-22 NOTE — Telephone Encounter (Signed)
Lft pt msg on both numbers to call ofc to sch US abdomen and sent a Mychart msg. thanks

## 2021-09-29 ENCOUNTER — Encounter: Payer: Self-pay | Admitting: Internal Medicine

## 2021-09-29 DIAGNOSIS — M2012 Hallux valgus (acquired), left foot: Secondary | ICD-10-CM | POA: Diagnosis not present

## 2021-09-29 DIAGNOSIS — M2042 Other hammer toe(s) (acquired), left foot: Secondary | ICD-10-CM | POA: Diagnosis not present

## 2021-09-29 DIAGNOSIS — M25572 Pain in left ankle and joints of left foot: Secondary | ICD-10-CM | POA: Diagnosis not present

## 2021-10-05 ENCOUNTER — Ambulatory Visit: Payer: Medicare Other | Admitting: Internal Medicine

## 2021-10-10 ENCOUNTER — Encounter: Payer: Self-pay | Admitting: Internal Medicine

## 2021-10-11 NOTE — Telephone Encounter (Signed)
For your information  

## 2021-11-02 ENCOUNTER — Ambulatory Visit (INDEPENDENT_AMBULATORY_CARE_PROVIDER_SITE_OTHER): Payer: Medicare Other | Admitting: Podiatry

## 2021-11-02 ENCOUNTER — Other Ambulatory Visit: Payer: Self-pay

## 2021-11-02 DIAGNOSIS — M216X2 Other acquired deformities of left foot: Secondary | ICD-10-CM | POA: Diagnosis not present

## 2021-11-02 NOTE — Progress Notes (Signed)
Subjective:  Patient ID: Annette Lee, female    DOB: 11-14-1935,  MRN: [id number]  Chief Complaint  Patient presents with   Callouses    Left foot under the toes     86 y.o. female presents with the above complaint.  Patient presents with hyperkeratotic lesion/benign skin lesion/porokeratosis left submetatarsal 2.  She states is painful to walk on has progressed to gotten worse.  She states she has been developing this for a long time.  Hurts with ambulation hurts when putting pressure on it.  She would like to discuss treatment options for this.  She is tried some padding see which sometimes it helps but has not gotten any better.  Her pain scale 7 out of 10 is making it is preventing her from ambulating.   Review of Systems: Negative except as noted in the HPI. Denies N/V/F/Ch.  Past Medical History:  Diagnosis Date   Allergic rhinitis due to pollen    Blood present in stool    Cancer ([place])    bcc forehead s/p Mohs Dr. Roel Cluck (of note nmsc x 2 face and 1x neck)    COVID-19 04/28/2021   [place] mild sxs   Endometriosis    Environmental and seasonal allergies    GERD (gastroesophageal reflux disease)    Hyperlipidemia    Osteopenia 08/2011   T -1.8 in hip   Osteoporosis    Sleep disturbance    UTI (urinary tract infection)    Vaginal atrophy     Current Outpatient Medications:    cyanocobalamin (,VITAMIN B-12,) 1000 MCG/ML injection, Inject 1 mL (1,000 mcg total) into the muscle every 30 (thirty) days., Disp: 12 mL, Rfl: 5   eszopiclone (LUNESTA) 2 MG TABS tablet, TAKE 1 TABLET(2 MG) BY MOUTH AT BEDTIME AS NEEDED FOR SLEEP, Disp: 15 tablet, Rfl: 2   NEEDLE, DISP, 25 G 25G X 1-1/2" MISC, 1 Device by Does not apply route every 30 (thirty) days., Disp: 12 each, Rfl: 5   simvastatin (ZOCOR) 10 MG tablet, TAKE 1 TABLET BY MOUTH EVERY MONDAY, Disp: 15 tablet, Rfl: 11  Current Facility-Administered Medications:    denosumab (PROLIA) injection 60 mg, 60 mg,  Subcutaneous, Q6 months, McLean-Scocuzza, Nino Glow, MD, 60 mg at 01/28/21 3235  Social History   Tobacco Use  Smoking Status Former   Types: Cigarettes   Quit date: 10/30/1966   Years since quitting: 55.0  Smokeless Tobacco Never  Tobacco Comments   as of 08/2017 quit smoking >15 years ago 1ppd x 35 years no FH lung cancer     Allergies  Allergen Reactions   Contrast Media [Iodinated Contrast Media] Anaphylaxis   Objective:  There were no vitals filed for this visit. There is no height or weight on file to calculate BMI. Constitutional Well developed. Well nourished.  Vascular Dorsalis pedis pulses palpable bilaterally. Posterior tibial pulses palpable bilaterally. Capillary refill normal to all digits.  No cyanosis or clubbing noted. Pedal hair growth normal.  Neurologic Normal speech. Oriented to person, place, and time. Epicritic sensation to light touch grossly present bilaterally.  Dermatologic Left submetatarsal 2 hyperkeratotic lesion/benign skin lesion noted to submetatarsal 2.  Pain on palpation to the lesion.  The lesion was debrided down to healthy striated tissue.  No complication noted.  No pinpoint bleeding noted.  Orthopedic: Normal joint ROM without pain or crepitus bilaterally. No visible deformities. No bony tenderness.   Radiographs: None Assessment:   1. Plantar flexed metatarsal bone of left foot  Plan:  Patient was evaluated and treated and all questions answered.  Left submetatarsal 2 plantarflexed metatarsal with underlying callus/benign skin lesion -All questions and concerns were discussed with the patient in extensive detail -I discussed with her the importance of shoe gear modification as well as padding.  Metatarsal pads were dispensed to give her relief and take the stress off of the second metatarsal head.  I discussed with her that if padding and shoe gear modification does not help patient may need surgical intervention.  She does have  severe bunion deformity with underlying hammertoe contracture and a crossover deformity of the second leading to a lot of pressure submetatarsal 2.  I briefly discussed the options that she has for surgery as well.  She states understand would like to hold off for now.  No follow-ups on file.

## 2021-11-04 ENCOUNTER — Other Ambulatory Visit: Payer: Self-pay

## 2021-11-04 ENCOUNTER — Ambulatory Visit
Admission: RE | Admit: 2021-11-04 | Discharge: 2021-11-04 | Disposition: A | Discharge status: Home / Self Care | Payer: Medicare Other | Source: Ambulatory Visit | Attending: Internal Medicine | Admitting: Internal Medicine

## 2021-11-04 DIAGNOSIS — R110 Nausea: Secondary | ICD-10-CM | POA: Diagnosis not present

## 2021-11-04 DIAGNOSIS — K8020 Calculus of gallbladder without cholecystitis without obstruction: Secondary | ICD-10-CM | POA: Diagnosis not present

## 2021-11-04 DIAGNOSIS — R109 Unspecified abdominal pain: Secondary | ICD-10-CM | POA: Diagnosis not present

## 2021-11-08 ENCOUNTER — Other Ambulatory Visit (INDEPENDENT_AMBULATORY_CARE_PROVIDER_SITE_OTHER): Payer: Medicare Other

## 2021-11-08 ENCOUNTER — Other Ambulatory Visit: Payer: Self-pay

## 2021-11-08 DIAGNOSIS — R5383 Other fatigue: Secondary | ICD-10-CM

## 2021-11-08 DIAGNOSIS — R110 Nausea: Secondary | ICD-10-CM | POA: Diagnosis not present

## 2021-11-08 DIAGNOSIS — E785 Hyperlipidemia, unspecified: Secondary | ICD-10-CM | POA: Diagnosis not present

## 2021-11-08 DIAGNOSIS — E538 Deficiency of other specified B group vitamins: Secondary | ICD-10-CM | POA: Diagnosis not present

## 2021-11-08 DIAGNOSIS — Z1329 Encounter for screening for other suspected endocrine disorder: Secondary | ICD-10-CM

## 2021-11-08 LAB — COMPREHENSIVE METABOLIC PANEL
ALT: 10 U/L (ref 0–35)
AST: 17 U/L (ref 0–37)
Albumin: 4.4 g/dL (ref 3.5–5.2)
Alkaline Phosphatase: 46 U/L (ref 39–117)
BUN: 20 mg/dL (ref 6–23)
CO2: 28 mEq/L (ref 19–32)
Calcium: 9.6 mg/dL (ref 8.4–10.5)
Chloride: 107 mEq/L (ref 96–112)
Creatinine, Ser: 0.74 mg/dL (ref 0.40–1.20)
GFR: 74.83 mL/min (ref >60.00)
Glucose, Bld: 98 mg/dL (ref 70–99)
Potassium: 4.1 mEq/L (ref 3.5–5.1)
Sodium: 142 mEq/L (ref 135–145)
Total Bilirubin: 0.6 mg/dL (ref 0.2–1.2)
Total Protein: 7 g/dL (ref 6.0–8.3)

## 2021-11-08 LAB — LIPID PANEL
Cholesterol: 188 mg/dL (ref 0–200)
HDL: 60.1 mg/dL (ref >39.00)
LDL Cholesterol: 114 mg/dL — ABNORMAL HIGH (ref 0–99)
NonHDL: 127.85
Total CHOL/HDL Ratio: 3
Triglycerides: 68 mg/dL (ref 0.0–149.0)
VLDL: 13.6 mg/dL (ref 0.0–40.0)

## 2021-11-08 LAB — CBC WITH DIFFERENTIAL/PLATELET
Basophils Absolute: 0 10*3/uL (ref 0.0–0.1)
Basophils Relative: 0.8 % (ref 0.0–3.0)
Eosinophils Absolute: 0.1 10*3/uL (ref 0.0–0.7)
Eosinophils Relative: 2.2 % (ref 0.0–5.0)
HCT: 44.2 % (ref 36.0–46.0)
Hemoglobin: 14.5 g/dL (ref 12.0–15.0)
Lymphocytes Relative: 19.8 % (ref 12.0–46.0)
Lymphs Abs: 1.3 10*3/uL (ref 0.7–4.0)
MCHC: 32.7 g/dL (ref 30.0–36.0)
MCV: 92.2 fl (ref 78.0–100.0)
Monocytes Absolute: 0.5 10*3/uL (ref 0.1–1.0)
Monocytes Relative: 8.6 % (ref 3.0–12.0)
Neutro Abs: 4.3 10*3/uL (ref 1.4–7.7)
Neutrophils Relative %: 68.6 % (ref 43.0–77.0)
Platelets: 186 10*3/uL (ref 150.0–400.0)
RBC: 4.8 Mil/uL (ref 3.87–5.11)
RDW: 13.5 % (ref 11.5–15.5)
WBC: 6.3 10*3/uL (ref 4.0–10.5)

## 2021-11-08 LAB — VITAMIN B12: Vitamin B-12: 281 pg/mL (ref 211–911)

## 2021-11-08 LAB — TSH: TSH: 2.32 u[IU]/mL (ref 0.35–5.50)

## 2021-11-09 ENCOUNTER — Encounter: Payer: Self-pay | Admitting: Internal Medicine

## 2021-11-09 LAB — URINALYSIS, ROUTINE W REFLEX MICROSCOPIC
Bilirubin Urine: NEGATIVE
Glucose, UA: NEGATIVE
Hyaline Cast: NONE SEEN /LPF
Ketones, ur: NEGATIVE
Nitrite: NEGATIVE
Protein, ur: NEGATIVE
Specific Gravity, Urine: 1.023 (ref 1.001–1.035)
pH: 5.5 (ref 5.0–8.0)

## 2021-11-09 LAB — MICROSCOPIC MESSAGE

## 2021-11-09 NOTE — Telephone Encounter (Signed)
LDL increased make sure she is taking her statin medication weekly healthy diet and exercise B12 improved continue B12 1000 mcg daily she could even increase to 2000 mcg daily   Other labs normal thyroid, blood cts, liver kidneys  Urine pending will place a note in my chart when ready  Written by Delorise Jackson, MD on 11/08/2021 12:31 PM EST Seen by patient Annette Lee on 11/08/2021  1:32 PM  Patient scheduled to come in tomorrow

## 2021-11-10 ENCOUNTER — Ambulatory Visit (INDEPENDENT_AMBULATORY_CARE_PROVIDER_SITE_OTHER): Payer: Medicare Other | Admitting: Internal Medicine

## 2021-11-10 ENCOUNTER — Other Ambulatory Visit: Payer: Self-pay

## 2021-11-10 ENCOUNTER — Encounter: Payer: Self-pay | Admitting: Internal Medicine

## 2021-11-10 VITALS — BP 124/68 | HR 71 | Temp 97.0°F | Ht 64.02 in | Wt 131.0 lb

## 2021-11-10 DIAGNOSIS — R319 Hematuria, unspecified: Secondary | ICD-10-CM | POA: Diagnosis not present

## 2021-11-10 DIAGNOSIS — M25552 Pain in left hip: Secondary | ICD-10-CM

## 2021-11-10 DIAGNOSIS — M79672 Pain in left foot: Secondary | ICD-10-CM | POA: Diagnosis not present

## 2021-11-10 DIAGNOSIS — M810 Age-related osteoporosis without current pathological fracture: Secondary | ICD-10-CM

## 2021-11-10 DIAGNOSIS — M545 Low back pain, unspecified: Secondary | ICD-10-CM | POA: Diagnosis not present

## 2021-11-10 DIAGNOSIS — G8929 Other chronic pain: Secondary | ICD-10-CM

## 2021-11-10 DIAGNOSIS — G4700 Insomnia, unspecified: Secondary | ICD-10-CM

## 2021-11-10 MED ORDER — ESZOPICLONE 2 MG PO TABS: ORAL_TABLET | ORAL | 2 refills | Status: DC

## 2021-11-10 NOTE — Progress Notes (Signed)
Chief Complaint  Patient presents with   Foot Problem    Foot pain in the left foot pad. Pain is noticeable with walking and applying pressure to that section of the left foot.    F/u  1. C/o left foot 8/10 with pressure in bottom of foot and ankle problems chronically tried insoles ice and heat w/o relief saw Dr. Posey Pronto podiatry recently dx 11/02/21  Plantar flexed metatarsal bone of left foot consider surgery in insoles not working  She wants 2nd opinion 2. C/o left hip pain and catching and declines imaging today will call back  3. Hematuria check urine ct ab/pelvis 2019 neg kidney stones    Review of Systems  Musculoskeletal:  Positive for joint pain.  Past Medical History:  Diagnosis Date   Allergic rhinitis due to pollen    Blood present in stool    Cancer ([place])    bcc forehead s/p Mohs Dr. Roel Cluck (of note nmsc x 2 face and 1x neck)    COVID-19 04/28/2021   [place] mild sxs   Endometriosis    Environmental and seasonal allergies    GERD (gastroesophageal reflux disease)    Hyperlipidemia    Osteopenia 08/2011   T -1.8 in hip   Osteoporosis    Sleep disturbance    UTI (urinary tract infection)    Vaginal atrophy    Past Surgical History:  Procedure Laterality Date   ABDOMINAL HYSTERECTOMY     total    APPENDECTOMY     CATARACT EXTRACTION W/ INTRAOCULAR LENS  IMPLANT, BILATERAL     COLONOSCOPY WITH PROPOFOL N/A 01/22/2016   Procedure: COLONOSCOPY WITH PROPOFOL;  Surgeon: Josefine Class, MD;  Location: [facility] ENDOSCOPY;  Service: Endoscopy;  Laterality: N/A;   ROTATOR CUFF REPAIR Right ~2001   SHOULDER ARTHROSCOPY WITH SUBACROMIAL DECOMPRESSION AND BICEP TENDON REPAIR Left 11/08/2018   Procedure: SHOULDER ARTHROSCOPY WITH DEBRIDEMENT, DECOMPRESSION AND ROTATOR CUFF REPAIR;  Surgeon: Corky Mull, MD;  Location: [facility] ORS;  Service: Orthopedics;  Laterality: Left;   Family History  Problem Relation Age of Onset   Cancer Mother        CLL   Cancer Father         colon   Aneurysm Father    Cancer Brother        colon cancer   Heart disease Maternal Uncle    Diabetes Neg Hx    Breast cancer Neg Hx    Bladder Cancer Neg Hx    Kidney cancer Neg Hx    Social History   Socioeconomic History   Marital status: Married    Spouse name: Douglass   Number of children: 0   Years of education: Not on file   Highest education level: Not on file  Occupational History   Occupation: Scientist, water quality    Comment: Retired  Tobacco Use   Smoking status: Former    Types: Cigarettes    Quit date: 10/30/1966    Years since quitting: 55.0   Smokeless tobacco: Never   Tobacco comments:    as of 08/2017 quit smoking >15 years ago 1ppd x 35 years no FH lung cancer   Vaping Use   Vaping Use: Never used  Substance and Sexual Activity   Alcohol use: Yes    Alcohol/week: 2.0 standard drinks    Types: 2 Shots of liquor per week    Comment: drinks gin per week   Drug use: No   Sexual activity: Never  Other  Topics Concern   Not on file  Social History Narrative   Has living will   Husband is health care POA.   Would accept resuscitation but no prolonged ventilation   Not sure about tube feeds   Moved her from [place]    Exercises 3x per week    No kids    Social Determinants of Health   Financial Resource Strain: Not on file  Food Insecurity: Not on file  Transportation Needs: Not on file  Physical Activity: Not on file  Stress: Not on file  Social Connections: Not on file  Intimate Partner Violence: Not on file   No outpatient medications have been marked as taking for the 11/10/21 encounter (Office Visit) with McLean-Scocuzza, Nino Glow, MD.   Current Facility-Administered Medications for the 11/10/21 encounter (Office Visit) with McLean-Scocuzza, Nino Glow, MD  Medication   denosumab (PROLIA) injection 60 mg   Allergies  Allergen Reactions   Contrast Media [Iodinated Contrast Media] Anaphylaxis   Recent Results (from  the past 2160 hour(s))  Vitamin B12     Status: None   Collection Time: 11/08/21  8:56 AM  Result Value Ref Range   Vitamin B-12 281 211 - 911 pg/mL  Urinalysis, Routine w reflex microscopic     Status: Abnormal   Collection Time: 11/08/21  8:56 AM  Result Value Ref Range   Color, Urine YELLOW YELLOW   APPearance TURBID (A) CLEAR   Specific Gravity, Urine 1.023 1.001 - 1.035   pH 5.5 5.0 - 8.0   Glucose, UA NEGATIVE NEGATIVE   Bilirubin Urine NEGATIVE NEGATIVE   Ketones, ur NEGATIVE NEGATIVE   Hgb urine dipstick 1+ (A) NEGATIVE   Protein, ur NEGATIVE NEGATIVE   Nitrite NEGATIVE NEGATIVE   Leukocytes,Ua 2+ (A) NEGATIVE   WBC, UA 20-40 (A) 0 - 5 /HPF   RBC / HPF 0-2 0 - 2 /HPF   Squamous Epithelial / LPF 6-10 (A) < OR = 5 /HPF   Bacteria, UA FEW (A) NONE SEEN /HPF   Calcium Oxalate Crystal MANY (A) NONE OR FEW /HPF   Hyaline Cast NONE SEEN NONE SEEN /LPF  TSH     Status: None   Collection Time: 11/08/21  8:56 AM  Result Value Ref Range   TSH 2.32 0.35 - 5.50 uIU/mL  CBC with Differential/Platelet     Status: None   Collection Time: 11/08/21  8:56 AM  Result Value Ref Range   WBC 6.3 4.0 - 10.5 K/uL   RBC 4.80 3.87 - 5.11 Mil/uL   Hemoglobin 14.5 12.0 - 15.0 g/dL   HCT 44.2 36.0 - 46.0 %   MCV 92.2 78.0 - 100.0 fl   MCHC 32.7 30.0 - 36.0 g/dL   RDW 13.5 11.5 - 15.5 %   Platelets 186.0 150.0 - 400.0 K/uL   Neutrophils Relative % 68.6 43.0 - 77.0 %   Lymphocytes Relative 19.8 12.0 - 46.0 %   Monocytes Relative 8.6 3.0 - 12.0 %   Eosinophils Relative 2.2 0.0 - 5.0 %   Basophils Relative 0.8 0.0 - 3.0 %   Neutro Abs 4.3 1.4 - 7.7 K/uL   Lymphs Abs 1.3 0.7 - 4.0 K/uL   Monocytes Absolute 0.5 0.1 - 1.0 K/uL   Eosinophils Absolute 0.1 0.0 - 0.7 K/uL   Basophils Absolute 0.0 0.0 - 0.1 K/uL  Lipid panel     Status: Abnormal   Collection Time: 11/08/21  8:56 AM  Result Value Ref Range   Cholesterol 188  0 - 200 mg/dL    Comment: ATP III Classification       Desirable:  < 200  mg/dL               Borderline High:  200 - 239 mg/dL          High:  > = 240 mg/dL   Triglycerides 68.0 0.0 - 149.0 mg/dL    Comment: Normal:  <150 mg/dLBorderline High:  150 - 199 mg/dL   HDL 60.10 >39.00 mg/dL   VLDL 13.6 0.0 - 40.0 mg/dL   LDL Cholesterol 114 (H) 0 - 99 mg/dL   Total CHOL/HDL Ratio 3     Comment:                Men          Women1/2 Average Risk     3.4          3.3Average Risk          5.0          4.42X Average Risk          9.6          7.13X Average Risk          15.0          11.0                       NonHDL 127.85     Comment: NOTE:  Non-HDL goal should be 30 mg/dL higher than patient's LDL goal (i.e. LDL goal of < 70 mg/dL, would have non-HDL goal of < 100 mg/dL)  Comprehensive metabolic panel     Status: None   Collection Time: 11/08/21  8:56 AM  Result Value Ref Range   Sodium 142 135 - 145 mEq/L   Potassium 4.1 3.5 - 5.1 mEq/L   Chloride 107 96 - 112 mEq/L   CO2 28 19 - 32 mEq/L   Glucose, Bld 98 70 - 99 mg/dL   BUN 20 6 - 23 mg/dL   Creatinine, Ser 0.74 0.40 - 1.20 mg/dL   Total Bilirubin 0.6 0.2 - 1.2 mg/dL   Alkaline Phosphatase 46 39 - 117 U/L   AST 17 0 - 37 U/L   ALT 10 0 - 35 U/L   Total Protein 7.0 6.0 - 8.3 g/dL   Albumin 4.4 3.5 - 5.2 g/dL   GFR 74.83 >60.00 mL/min    Comment: Calculated using the CKD-EPI Creatinine Equation (2021)   Calcium 9.6 8.4 - 10.5 mg/dL  MICROSCOPIC MESSAGE     Status: None   Collection Time: 11/08/21  8:56 AM  Result Value Ref Range   Note      Comment: This urine was analyzed for the presence of WBC,  RBC, bacteria, casts, and other formed elements.  Only those elements seen were reported. . .    Objective  Body mass index is 22.47 kg/m. Wt Readings from Last 3 Encounters:  11/10/21 131 lb (59.4 kg)  09/08/21 128 lb 12.8 oz (58.4 kg)  04/29/21 129 lb (58.5 kg)   Temp Readings from Last 3 Encounters:  11/10/21 (!) 97 F (36.1 C) (Temporal)  09/08/21 (!) 97.3 F (36.3 C) (Temporal)  01/28/21 97.7  F (36.5 C) (Oral)   BP Readings from Last 3 Encounters:  11/10/21 124/68  09/08/21 110/60  01/28/21 116/70   Pulse Readings from Last 3 Encounters:  11/10/21 71  09/08/21 87  01/28/21 70  Physical Exam Vitals and nursing note reviewed.  Constitutional:      Appearance: Normal appearance. She is well-developed and well-groomed.  HENT:     Head: Normocephalic and atraumatic.  Eyes:     Conjunctiva/sclera: Conjunctivae normal.     Pupils: Pupils are equal, round, and reactive to light.  Cardiovascular:     Rate and Rhythm: Normal rate and regular rhythm.     Heart sounds: Normal heart sounds. No murmur heard. Pulmonary:     Effort: Pulmonary effort is normal.     Breath sounds: Normal breath sounds.  Abdominal:     General: Abdomen is flat. Bowel sounds are normal.     Tenderness: There is no abdominal tenderness.  Musculoskeletal:        General: No tenderness.  Skin:    General: Skin is warm and dry.  Neurological:     General: No focal deficit present.     Mental Status: She is alert and oriented to person, place, and time. Mental status is at baseline.     Cranial Nerves: Cranial nerves 2-12 are intact.     Gait: Gait is intact.  Psychiatric:        Attention and Perception: Attention and perception normal.        Mood and Affect: Mood and affect normal.        Speech: Speech normal.        Behavior: Behavior normal. Behavior is cooperative.        Thought Content: Thought content normal.        Cognition and Memory: Cognition and memory normal.        Judgment: Judgment normal.    Assessment  Plan  Left foot pain - Plan: Ambulatory referral to Orthopedic Surgery Dr. Lucia Gaskins in [place]  Hematuria, unspecified type - Plan: Urine Culture  Left hip pain Let me know call back low back and left hip Xrays  Insomnia, unspecified type - Plan: eszopiclone (LUNESTA) 2 MG TABS tablet   HM Flu shot utd  Had pna vaccines x 2  -pna 23  utd no need for futher Had  zostervax  -given Rx shingrix x 2 doses  Tdap had 12/25/17   covid vx had 4/4 moderna consider 5th dose   Out of pap window s/p total hysterectomy just saw OB/GYN 12/2017   mammo neg 02/03/20 negative ordered sch 02/03/21 , ordered 2023    dexa 11/2019 osteopenia high FRAX score - 01/29/20 prolia ordered had dose 07/30/20; ordered 2023 sch prolia 02/01/22   Colonoscopy: 01/22/16 Dr. Rayann Heman h/o colon polpys.  colonoscopy was difficult per report and she didn't like the MD who did the procedure. Reviewed +IH and adhesions making exam difficult.    cologuard done 02/03/16 repeat due 02/03/2019 until age 63 reviewed with pt ok with this plan will re ordered closer to date  -+cologuard 02/08/19  -colonoscopy 03/05/19 IH otherwise neg   Saw dermatology on [place] 06/16/17 had [place] LN2 f/u in 1 year, recently saw 06/2019  H/o nmsc x 2 face and 1 neck h/o mohs [facility] Dr. Manley Mason   dermatology appt 07/13/20 had Hassan Rowan wnl f/u in 1 year sch 07/20/21    She is former smoker quit >15 years ago smoked 1 ppd x 35 yeas no FH lung cancer  -consider CT low dose if insurance will cover in future      Cont exercise 3x per week Provider: Dr. Olivia Mackie McLean-Scocuzza-Internal Medicine

## 2021-11-10 NOTE — Patient Instructions (Addendum)
B12 injection every 30 days with additional B12 500 to 100 capsules if can tolerate I am ok if you do not  Consider custom insoles with podiatry  Dr. [facility]   Call me back if you want low back xray or left hip Xray  Check if insurance covers nutrition please   Dr. Cleda Mccreedy podiatry [facility] if ortho does not work  Administrator Address  [phone] Not available [place] [address]     Specialties     Podiatry            Referred  Dr. Radene Journey ortho in [place] but for foot and ankle  [facility] Address: [address], [place], [place] [address] Hours:  Open ? Closes 5:30?PM Phone: [phone] Foot Pain Many things can cause foot pain. Some common causes are: An injury. A sprain. Arthritis. Blisters. Bunions. Follow these instructions at home: Managing pain, stiffness, and swelling If directed, put ice on the painful area: Put ice in a plastic bag. Place a towel between your skin and the bag. Leave the ice on for 20 minutes, 2-3 times a day.  Activity Do not stand or walk for long periods. Return to your normal activities as told by your health care provider. Ask your health care provider what activities are safe for you. Do stretches to relieve foot pain and stiffness as told by your health care provider. Do not lift anything that is heavier than 10 lb (4.5 kg), or the limit that you are told, until your health care provider says that it is safe. Lifting a lot of weight can put added pressure on your feet. Lifestyle Wear comfortable, supportive shoes that fit you well. Do not wear high heels. Keep your feet clean and dry. General instructions Take over-the-counter and prescription medicines only as told by your health care provider. Rub your foot gently. Pay attention to any changes in your symptoms. Keep all follow-up visits as told by your health care provider. This is important. Contact  a health care provider if: Your pain does not get better after a few days of self-care. Your pain gets worse. You cannot stand on your foot. Get help right away if: Your foot is numb or tingling. Your foot or toes are swollen. Your foot or toes turn white or blue. You have warmth and redness along your foot. Summary Common causes of foot pain are injury, sprain, arthritis, blisters, or bunions. Ice, medicines, and comfortable shoes may help foot pain. Contact your health care provider if your pain does not get better after a few days of self-care. This information is not intended to replace advice given to you by your health care provider. Make sure you discuss any questions you have with your health care provider. Document Revised: 01/13/2021 Document Reviewed: 01/13/2021 Elsevier Patient Education  [place].

## 2021-11-11 LAB — URINE CULTURE
MICRO NUMBER:: 12886464
Result:: NO GROWTH
SPECIMEN QUALITY:: ADEQUATE

## 2021-11-15 DIAGNOSIS — M7742 Metatarsalgia, left foot: Secondary | ICD-10-CM | POA: Diagnosis not present

## 2021-12-14 ENCOUNTER — Telehealth: Payer: Self-pay | Admitting: *Deleted

## 2021-12-14 NOTE — Telephone Encounter (Signed)
"  I need a copy of my x-rays." It's a 10 to 14 day turn around time for x-ray copies.  "Why does it take so long?  I need them.  I've had foot problems for a long time.  I'm going to go see another Podiatrist.  So I need them."  There's a high demand and it takes time to process them.  "Okay, so will you call me when the x-rays are ready?"  Yes, I will call and let you know.

## 2021-12-20 ENCOUNTER — Telehealth: Payer: Self-pay | Admitting: Internal Medicine

## 2021-12-20 NOTE — Telephone Encounter (Signed)
Prior authorization has been submitted for patient's Lunesta.   Awaiting approval or denial.

## 2021-12-23 NOTE — Telephone Encounter (Signed)
You had called and requested x-ray copies.  We did not do any x-rays.  "You didn't?  I thought you had.  It must have been somewhere else.  I'm sorry." ?

## 2022-01-05 DIAGNOSIS — L853 Xerosis cutis: Secondary | ICD-10-CM | POA: Diagnosis not present

## 2022-01-05 DIAGNOSIS — D1801 Hemangioma of skin and subcutaneous tissue: Secondary | ICD-10-CM | POA: Diagnosis not present

## 2022-01-05 DIAGNOSIS — L570 Actinic keratosis: Secondary | ICD-10-CM | POA: Diagnosis not present

## 2022-01-05 DIAGNOSIS — L820 Inflamed seborrheic keratosis: Secondary | ICD-10-CM | POA: Diagnosis not present

## 2022-01-05 DIAGNOSIS — L814 Other melanin hyperpigmentation: Secondary | ICD-10-CM | POA: Diagnosis not present

## 2022-01-05 DIAGNOSIS — D2271 Melanocytic nevi of right lower limb, including hip: Secondary | ICD-10-CM | POA: Diagnosis not present

## 2022-01-05 DIAGNOSIS — L821 Other seborrheic keratosis: Secondary | ICD-10-CM | POA: Diagnosis not present

## 2022-01-18 DIAGNOSIS — Z20822 Contact with and (suspected) exposure to covid-19: Secondary | ICD-10-CM | POA: Diagnosis not present

## 2022-01-21 ENCOUNTER — Telehealth: Payer: Self-pay | Admitting: *Deleted

## 2022-01-24 DIAGNOSIS — M2041 Other hammer toe(s) (acquired), right foot: Secondary | ICD-10-CM | POA: Diagnosis not present

## 2022-01-24 DIAGNOSIS — M778 Other enthesopathies, not elsewhere classified: Secondary | ICD-10-CM | POA: Diagnosis not present

## 2022-01-24 DIAGNOSIS — M2012 Hallux valgus (acquired), left foot: Secondary | ICD-10-CM | POA: Diagnosis not present

## 2022-01-24 DIAGNOSIS — M2042 Other hammer toe(s) (acquired), left foot: Secondary | ICD-10-CM | POA: Diagnosis not present

## 2022-01-24 NOTE — Telephone Encounter (Signed)
[facility] [place]: [id number] - PA Case ID: [id number] filed on Cover My meds. ? ? ? ?

## 2022-01-27 NOTE — Telephone Encounter (Signed)
Called [employer] spoke with Royce Macadamia was advised for by and bill using J code [phone] Patient does not need Prior approval due to Primary is medicare. ?

## 2022-01-31 ENCOUNTER — Other Ambulatory Visit: Payer: Self-pay | Admitting: Internal Medicine

## 2022-01-31 DIAGNOSIS — E785 Hyperlipidemia, unspecified: Secondary | ICD-10-CM

## 2022-02-01 ENCOUNTER — Ambulatory Visit (INDEPENDENT_AMBULATORY_CARE_PROVIDER_SITE_OTHER): Payer: Medicare Other

## 2022-02-01 DIAGNOSIS — M810 Age-related osteoporosis without current pathological fracture: Secondary | ICD-10-CM

## 2022-02-01 MED ORDER — DENOSUMAB 60 MG/ML ~~LOC~~ SOSY
60.0000 mg | PREFILLED_SYRINGE | Freq: Once | SUBCUTANEOUS | Status: AC
  Administered 2022-02-01: 60 mg via SUBCUTANEOUS

## 2022-02-01 NOTE — Progress Notes (Signed)
Patient came in today for Prolia injection given in right arm SQ. Patient tolerated will with no signs of distress.  ?

## 2022-02-07 ENCOUNTER — Ambulatory Visit
Admission: RE | Admit: 2022-02-07 | Discharge: 2022-02-07 | Disposition: A | Discharge status: Home / Self Care | Payer: Medicare Other | Source: Ambulatory Visit | Attending: Internal Medicine | Admitting: Internal Medicine

## 2022-02-07 DIAGNOSIS — M810 Age-related osteoporosis without current pathological fracture: Secondary | ICD-10-CM | POA: Insufficient documentation

## 2022-02-07 DIAGNOSIS — Z1231 Encounter for screening mammogram for malignant neoplasm of breast: Secondary | ICD-10-CM | POA: Insufficient documentation

## 2022-02-07 DIAGNOSIS — M85852 Other specified disorders of bone density and structure, left thigh: Secondary | ICD-10-CM | POA: Diagnosis not present

## 2022-02-09 DIAGNOSIS — D2271 Melanocytic nevi of right lower limb, including hip: Secondary | ICD-10-CM | POA: Diagnosis not present

## 2022-02-09 DIAGNOSIS — L853 Xerosis cutis: Secondary | ICD-10-CM | POA: Diagnosis not present

## 2022-02-09 DIAGNOSIS — L821 Other seborrheic keratosis: Secondary | ICD-10-CM | POA: Diagnosis not present

## 2022-02-09 DIAGNOSIS — L814 Other melanin hyperpigmentation: Secondary | ICD-10-CM | POA: Diagnosis not present

## 2022-02-17 DIAGNOSIS — Z20822 Contact with and (suspected) exposure to covid-19: Secondary | ICD-10-CM | POA: Diagnosis not present

## 2022-02-22 ENCOUNTER — Ambulatory Visit (INDEPENDENT_AMBULATORY_CARE_PROVIDER_SITE_OTHER): Payer: Medicare Other | Admitting: Internal Medicine

## 2022-02-22 ENCOUNTER — Encounter: Payer: Self-pay | Admitting: Internal Medicine

## 2022-02-22 VITALS — BP 124/70 | HR 77 | Temp 98.0°F | Resp 14 | Ht 64.02 in | Wt 133.4 lb

## 2022-02-22 DIAGNOSIS — R32 Unspecified urinary incontinence: Secondary | ICD-10-CM

## 2022-02-22 DIAGNOSIS — J309 Allergic rhinitis, unspecified: Secondary | ICD-10-CM

## 2022-02-22 DIAGNOSIS — G4700 Insomnia, unspecified: Secondary | ICD-10-CM | POA: Diagnosis not present

## 2022-02-22 DIAGNOSIS — E538 Deficiency of other specified B group vitamins: Secondary | ICD-10-CM | POA: Diagnosis not present

## 2022-02-22 DIAGNOSIS — Z1231 Encounter for screening mammogram for malignant neoplasm of breast: Secondary | ICD-10-CM | POA: Diagnosis not present

## 2022-02-22 DIAGNOSIS — M79672 Pain in left foot: Secondary | ICD-10-CM

## 2022-02-22 DIAGNOSIS — M810 Age-related osteoporosis without current pathological fracture: Secondary | ICD-10-CM

## 2022-02-22 DIAGNOSIS — E785 Hyperlipidemia, unspecified: Secondary | ICD-10-CM

## 2022-02-22 MED ORDER — CYANOCOBALAMIN 1000 MCG/ML IJ SOLN: 1000.0000 ug | INTRAMUSCULAR | 5 refills | Status: DC

## 2022-02-22 MED ORDER — ESZOPICLONE 2 MG PO TABS: ORAL_TABLET | ORAL | 2 refills | Status: DC

## 2022-02-22 NOTE — Progress Notes (Signed)
Chief Complaint  ?Patient presents with  ? Follow-up  ?  6 mon, discuss about congestion x1 wk concerned it may be allergy related. Took otc allergy med w/o relief. Discuss update on L foot.   ? ?F/u  ?1. Osteoporosis on prolia 02/01/22 due in 6 months  ?2. Allergies with sneezing c/o congestion using otc nose sprays with some relief ?3. Chronic left foot pain saw Dr. Cleda Mccreedy and ortho Dr. Radene Journey who rec [facility]otics from [facility] she declines has high arches wants to try PT or chiropractor referred to Dr. Max Fickle  ?4. Incontinence let me know if wants referral to Dr. Clarene Reamer ? ?Review of Systems  ?Constitutional:  Negative for weight loss.  ?HENT:  Negative for hearing loss.   ?Eyes:  Negative for blurred vision.  ?Respiratory:  Negative for shortness of breath.   ?Cardiovascular:  Negative for chest pain.  ?Gastrointestinal:  Negative for abdominal pain and blood in stool.  ?Genitourinary:  Positive for frequency. Negative for dysuria.  ?Musculoskeletal:  Positive for joint pain. Negative for falls.  ?Skin:  Negative for rash.  ?Neurological:  Negative for headaches.  ?Psychiatric/Behavioral:  Negative for depression.   ?Past Medical History:  ?Diagnosis Date  ? Allergic rhinitis due to pollen   ? Blood present in stool   ? Cancer [facility])   ? bcc forehead s/p Mohs Dr. Roel Cluck (of note [facility] x 2 face and 1x neck)   ? COVID-19 04/28/2021  ? [place] mild sxs  ? Endometriosis   ? Environmental and seasonal allergies   ? GERD (gastroesophageal reflux disease)   ? Hyperlipidemia   ? Osteopenia 08/2011  ? T -1.8 in hip  ? Osteoporosis   ? Sleep disturbance   ? UTI (urinary tract infection)   ? Vaginal atrophy   ? ?Past Surgical History:  ?Procedure Laterality Date  ? ABDOMINAL HYSTERECTOMY    ? total   ? APPENDECTOMY    ? CATARACT EXTRACTION W/ INTRAOCULAR LENS  IMPLANT, BILATERAL    ? COLONOSCOPY WITH PROPOFOL N/A 01/22/2016  ? Procedure: COLONOSCOPY WITH PROPOFOL;  Surgeon: Josefine Class, MD;   Location: [facility] ENDOSCOPY;  Service: Endoscopy;  Laterality: N/A;  ? ROTATOR CUFF REPAIR Right ~2001  ? SHOULDER ARTHROSCOPY WITH SUBACROMIAL DECOMPRESSION AND BICEP TENDON REPAIR Left 11/08/2018  ? Procedure: SHOULDER ARTHROSCOPY WITH DEBRIDEMENT, DECOMPRESSION AND ROTATOR CUFF REPAIR;  Surgeon: Corky Mull, MD;  Location: [facility] ORS;  Service: Orthopedics;  Laterality: Left;  ? ?Family History  ?Problem Relation Age of Onset  ? Cancer Mother   ?     CLL  ? Cancer Father   ?     colon  ? Aneurysm Father   ? Cancer Brother   ?     colon cancer  ? Heart disease Maternal Uncle   ? Diabetes Neg Hx   ? Breast cancer Neg Hx   ? Bladder Cancer Neg Hx   ? Kidney cancer Neg Hx   ? ?Social History  ? ?Socioeconomic History  ? Marital status: Married  ?  Spouse name: Kara Pacer  ? Number of children: 0  ? Years of education: Not on file  ? Highest education level: Not on file  ?Occupational History  ? Occupation: Scientist, water quality  ?  Comment: Retired  ?Tobacco Use  ? Smoking status: Former  ?  Types: Cigarettes  ?  Quit date: 10/30/1966  ?  Years since quitting: 55.3  ? Smokeless tobacco: Never  ? Tobacco comments:  ?  as of 08/2017 quit smoking >15 years ago 1ppd x 35 years no FH lung cancer   ?Vaping Use  ? Vaping Use: Never used  ?Substance and Sexual Activity  ? Alcohol use: Yes  ?  Alcohol/week: 2.0 standard drinks  ?  Types: 2 Shots of liquor per week  ?  Comment: drinks gin per week  ? Drug use: No  ? Sexual activity: Never  ?Other Topics Concern  ? Not on file  ?Social History Narrative  ? Has living will  ? Husband is health care POA.  ? Would accept resuscitation but no prolonged ventilation  ? Not sure about tube feeds  ? Moved her from [place]   ? Exercises 3x per week   ? No kids   ? ?Social Determinants of Health  ? ?Financial Resource Strain: Not on file  ?Food Insecurity: Not on file  ?Transportation Needs: Not on file  ?Physical Activity: Not on file  ?Stress: Not on file  ?Social Connections:  Not on file  ?Intimate Partner Violence: Not on file  ? ?Current Meds  ?Medication Sig  ? cyanocobalamin (,VITAMIN B-12,) 1000 MCG/ML injection Inject 1 mL (1,000 mcg total) into the muscle every 30 (thirty) days.  ? eszopiclone (LUNESTA) 2 MG TABS tablet TAKE 1 TABLET(2 MG) BY MOUTH AT BEDTIME AS NEEDED FOR SLEEP  ? NEEDLE, DISP, 25 G 25G X 1-1/2" MISC 1 Device by Does not apply route every 30 (thirty) days.  ? simvastatin (ZOCOR) 10 MG tablet TAKE 1 TABLET BY MOUTH EVERY MONDAY  ? ?Current Facility-Administered Medications for the 02/22/22 encounter (Office Visit) with McLean-Scocuzza, Nino Glow, MD  ?Medication  ? denosumab (PROLIA) injection 60 mg  ? ?Allergies  ?Allergen Reactions  ? Contrast Media [Iodinated Contrast Media] Anaphylaxis  ? ?No results found for this or any previous visit (from the past 2160 hour(s)). ?Objective  ?Body mass index is 22.88 kg/m?. ?Wt Readings from Last 3 Encounters:  ?02/22/22 133 lb 6.4 oz (60.5 kg)  ?11/10/21 131 lb (59.4 kg)  ?09/08/21 128 lb 12.8 oz (58.4 kg)  ? ?Temp Readings from Last 3 Encounters:  ?02/22/22 98 ?F (36.7 ?C) (Oral)  ?11/10/21 (!) 97 ?F (36.1 ?C) (Temporal)  ?09/08/21 (!) 97.3 ?F (36.3 ?C) (Temporal)  ? ?BP Readings from Last 3 Encounters:  ?02/22/22 124/70  ?11/10/21 124/68  ?09/08/21 110/60  ? ?Pulse Readings from Last 3 Encounters:  ?02/22/22 77  ?11/10/21 71  ?09/08/21 87  ? ? ?Physical Exam ?Vitals and nursing note reviewed.  ?Constitutional:   ?   Appearance: Normal appearance. She is well-developed and well-groomed.  ?HENT:  ?   Head: Normocephalic and atraumatic.  ?Eyes:  ?   Conjunctiva/sclera: Conjunctivae normal.  ?   Pupils: Pupils are equal, round, and reactive to light.  ?Cardiovascular:  ?   Rate and Rhythm: Normal rate and regular rhythm.  ?   Heart sounds: Normal heart sounds. No murmur heard. ?Pulmonary:  ?   Effort: Pulmonary effort is normal.  ?   Breath sounds: Normal breath sounds.  ?Abdominal:  ?   General: Abdomen is flat. Bowel sounds  are normal.  ?   Tenderness: There is no abdominal tenderness.  ?Musculoskeletal:     ?   General: No tenderness.  ?Skin: ?   General: Skin is warm and dry.  ?Neurological:  ?   General: No focal deficit present.  ?   Mental Status: She is alert and oriented to person, place, and time. Mental status  is at baseline.  ?   Cranial Nerves: Cranial nerves 2-12 are intact.  ?   Motor: Motor function is intact.  ?   Coordination: Coordination is intact.  ?   Gait: Gait is intact.  ?Psychiatric:     ?   Attention and Perception: Attention and perception normal.     ?   Mood and Affect: Mood and affect normal.     ?   Speech: Speech normal.     ?   Behavior: Behavior normal. Behavior is cooperative.     ?   Thought Content: Thought content normal.     ?   Cognition and Memory: Cognition and memory normal.     ?   Judgment: Judgment normal.  ? ? ?Assessment  ?Plan  ?Left foot pain - Plan: Ambulatory referral to Chiropractic ?Seen by Dr.Cline and Dr. Radene Journey  ?[facility]=Dr. Max Fickle ?[place] ?[place], [place] [address] ?[phone] ? ?Options for Physical Therapy  ?[facility] ?Nicole Kindred  ?[place] on [address]  ? ?Allergic rhinitis, unspecified seasonality, unspecified trigger ?Nasal saline , flonase or nasacort 2 sprays  ?Allergy pill at night allegra, zyrtec, claritin or xyzal at night  ? ?Hyperlipidemia, unspecified hyperlipidemia type ?Zocor 10 mg qhs  ? ?Osteoporosis, unspecified osteoporosis type, unspecified pathological fracture presence ?On prolia 02/01/22 f/u in 6 months ? ?Urinary incontinence, unspecified type  ?Consider PT Dr. Abbott Pao  ? ?HM ?Flu shot utd  ?Had pna vaccines x 2  ?-pna 23  utd no need for futher ?Had zostervax  ?-given Rx shingrix x 2 doses  ?Tdap had 12/25/17  ? covid vx had 4/4 moderna consider 5th dose ?  ?Out of pap window s/p total hysterectomy just saw OB/GYN 12/2017 ?  ?mammo neg 02/03/20 negative ordered sch 02/03/21 , ordered 2023  ?02/07/22  ? ?  ?dexa  11/2019 osteopenia high FRAX score ?- 01/29/20 prolia ordered had dose 07/30/20; ordered 2023 sch prolia 02/01/22 f/u in 6 months ? ?  ?Colonoscopy: ?01/22/16 Dr. Rayann Heman h/o colon polpys.  colonoscopy was difficult pe

## 2022-02-22 NOTE — Patient Instructions (Addendum)
[facility]=Dr. Max Fickle ?[place] ?[place], [place] [address] ?[phone] ? ?Options for Physical Therapy  ?[facility] ?Nicole Kindred  ?[place] [address]  ? ? ? ?Move back prolia shot to 08/03/22  ? ? ?Nasal saline , flonase or nasacort 2 sprays  ?Allergy pill at night allegra, zyrtec, claritin or xyzal at night  ? ?Allergic Rhinitis, Adult ? ?Allergic rhinitis is an allergic reaction that affects the mucous membrane inside the nose. The mucous membrane is the tissue that produces mucus. ?There are two types of allergic rhinitis: ?Seasonal. This type is also called hay fever and happens only during certain seasons. ?Perennial. This type can happen at any time of the year. ?Allergic rhinitis cannot be spread from person to person. This condition can be mild, moderate, or severe. It can develop at any age and may be outgrown. ?What are the causes? ?This condition is caused by allergens. These are things that can cause an allergic reaction. Allergens may differ for seasonal allergic rhinitis and perennial allergic rhinitis. ?Seasonal allergic rhinitis is triggered by pollen. Pollen can come from grasses, trees, and weeds. ?Perennial allergic rhinitis may be triggered by: ?Dust mites. ?Proteins in a pet's urine, saliva, or dander. Dander is dead skin cells from a pet. ?Smoke, mold, or car fumes. ?What increases the risk? ?You are more likely to develop this condition if you have a family history of allergies or other conditions related to allergies, including: ?Allergic conjunctivitis. This is inflammation of parts of the eyes and eyelids. ?Asthma. This condition affects the lungs and makes it hard to breathe. ?Atopic dermatitis or eczema. This is long term (chronic) inflammation of the skin. ?Food allergies. ?What are the signs or symptoms? ?Symptoms of this condition include: ?Sneezing or coughing. ?A stuffy nose (nasal congestion), itchy nose, or nasal discharge. ?Itchy eyes and  tearing of the eyes. ?A feeling of mucus dripping down the back of your throat (postnasal drip). ?Trouble sleeping. ?Tiredness or fatigue. ?Headache. ?Sore throat. ?How is this diagnosed? ?This condition may be diagnosed with your symptoms, medical history, and physical exam. Your health care provider may check for related conditions, such as: ?Asthma. ?Pink eye. This is eye inflammation caused by infection (conjunctivitis). ?Ear infection. ?Upper respiratory infection. This is an infection in the nose, throat, or upper airways. ?You may also have tests to find out which allergens trigger your symptoms. These may include skin tests or blood tests. ?How is this treated? ?There is no cure for this condition, but treatment can help control symptoms. Treatment may include: ?Taking medicines that block allergy symptoms, such as corticosteroids and antihistamines. Medicine may be given as a shot, nasal spray, or pill. ?Avoiding any allergens. ?Being exposed again and again to tiny amounts of allergens to help you build a defense against allergens (immunotherapy). This is done if other treatments have not helped. It may include: ?Allergy shots. These are injected medicines that have small amounts of allergen in them. ?Sublingual immunotherapy. This involves taking small doses of a medicine with allergen in it under your tongue. ?If these treatments do not work, your health care provider may prescribe newer, stronger medicines. ?Follow these instructions at home: ?Avoiding allergens ?Find out what you are allergic to and avoid those allergens. These are some things you can do to help avoid allergens: ?If you have perennial allergies: ?Replace carpet with wood, tile, or vinyl flooring. Carpet can trap dander and dust. ?Do not smoke. Do not allow smoking in your home. ?Change  your heating and air conditioning filters at least once a month. ?If you have seasonal allergies, take these steps during allergy season: ?Keep windows  closed as much as possible. ?Plan outdoor activities when pollen counts are lowest. Check pollen counts before you plan outdoor activities. ?When coming indoors, change clothing and shower before sitting on furniture or bedding. ?If you have a pet in the house that produces allergens: ?Keep the pet out of the bedroom. ?Vacuum, sweep, and dust regularly. ?General instructions ?Take over-the-counter and prescription medicines only as told by your health care provider. ?Drink enough fluid to keep your urine pale yellow. ?Keep all follow-up visits as told by your health care provider. This is important. ?Where to find more information ?American Academy of Allergy, Asthma & Immunology: www.aaaai.org ?Contact a health care provider if: ?You have a fever. ?You develop a cough that does not go away. ?You make whistling sounds when you breathe (wheeze). ?Your symptoms slow you down or stop you from doing your normal activities each day. ?Get help right away if: ?You have shortness of breath. ?This symptom may represent a serious problem that is an emergency. Do not wait to see if the symptom will go away. Get medical help right away. Call your local emergency services (911 in the [place] Do not drive yourself to the hospital. ?Summary ?Allergic rhinitis may be managed by taking medicines as directed and avoiding allergens. ?If you have seasonal allergies, keep windows closed as much as possible during allergy season. ?Contact your health care provider if you develop a fever or a cough that does not go away. ?This information is not intended to replace advice given to you by your health care provider. Make sure you discuss any questions you have with your health care provider. ?Document Revised: 11/29/2019 Document Reviewed: 10/08/2019 ?Elsevier Patient Education ? [place]. ? ? ?

## 2022-02-28 ENCOUNTER — Telehealth: Payer: Self-pay | Admitting: Internal Medicine

## 2022-02-28 NOTE — Telephone Encounter (Signed)
Informed pt to head to pharmacy to get 2 dose series of shingles shot since pt is covered through medicare. Pt verbalized understanding. ?

## 2022-02-28 NOTE — Telephone Encounter (Signed)
Pt called in wondering if she had all her shingles shots... Pt have medicare part A & B... Pt requesting callback  ?

## 2022-02-28 NOTE — Telephone Encounter (Signed)
No not by my record and id previously  given her a Rx to get shringrix vaccines x 2  ?Yes at pharmacy with medicare  ? ?

## 2022-04-05 DIAGNOSIS — M9906 Segmental and somatic dysfunction of lower extremity: Secondary | ICD-10-CM | POA: Diagnosis not present

## 2022-04-11 ENCOUNTER — Other Ambulatory Visit: Payer: Self-pay | Admitting: Internal Medicine

## 2022-04-11 DIAGNOSIS — G4700 Insomnia, unspecified: Secondary | ICD-10-CM

## 2022-04-11 DIAGNOSIS — E538 Deficiency of other specified B group vitamins: Secondary | ICD-10-CM

## 2022-04-28 DIAGNOSIS — H52222 Regular astigmatism, left eye: Secondary | ICD-10-CM | POA: Diagnosis not present

## 2022-04-28 DIAGNOSIS — H43813 Vitreous degeneration, bilateral: Secondary | ICD-10-CM | POA: Diagnosis not present

## 2022-04-28 DIAGNOSIS — H5201 Hypermetropia, right eye: Secondary | ICD-10-CM | POA: Diagnosis not present

## 2022-04-28 DIAGNOSIS — Z961 Presence of intraocular lens: Secondary | ICD-10-CM | POA: Diagnosis not present

## 2022-04-28 DIAGNOSIS — H5212 Myopia, left eye: Secondary | ICD-10-CM | POA: Diagnosis not present

## 2022-04-28 DIAGNOSIS — H52221 Regular astigmatism, right eye: Secondary | ICD-10-CM | POA: Diagnosis not present

## 2022-04-28 DIAGNOSIS — H04123 Dry eye syndrome of bilateral lacrimal glands: Secondary | ICD-10-CM | POA: Diagnosis not present

## 2022-07-11 ENCOUNTER — Encounter: Payer: Self-pay | Admitting: *Deleted

## 2022-07-13 DIAGNOSIS — L570 Actinic keratosis: Secondary | ICD-10-CM | POA: Diagnosis not present

## 2022-07-13 DIAGNOSIS — L814 Other melanin hyperpigmentation: Secondary | ICD-10-CM | POA: Diagnosis not present

## 2022-07-27 ENCOUNTER — Ambulatory Visit: Payer: TRICARE For Life (TFL)

## 2022-08-03 ENCOUNTER — Ambulatory Visit (INDEPENDENT_AMBULATORY_CARE_PROVIDER_SITE_OTHER): Payer: Medicare Other | Admitting: Internal Medicine

## 2022-08-03 ENCOUNTER — Telehealth: Payer: Self-pay | Admitting: Internal Medicine

## 2022-08-03 ENCOUNTER — Encounter: Payer: Self-pay | Admitting: Internal Medicine

## 2022-08-03 VITALS — BP 118/58 | HR 63 | Temp 98.1°F | Ht 64.02 in | Wt 132.8 lb

## 2022-08-03 DIAGNOSIS — Z2911 Encounter for prophylactic immunotherapy for respiratory syncytial virus (RSV): Secondary | ICD-10-CM

## 2022-08-03 DIAGNOSIS — E785 Hyperlipidemia, unspecified: Secondary | ICD-10-CM | POA: Diagnosis not present

## 2022-08-03 DIAGNOSIS — Z23 Encounter for immunization: Secondary | ICD-10-CM | POA: Diagnosis not present

## 2022-08-03 DIAGNOSIS — Z9189 Other specified personal risk factors, not elsewhere classified: Secondary | ICD-10-CM | POA: Diagnosis not present

## 2022-08-03 DIAGNOSIS — M810 Age-related osteoporosis without current pathological fracture: Secondary | ICD-10-CM | POA: Diagnosis not present

## 2022-08-03 DIAGNOSIS — E538 Deficiency of other specified B group vitamins: Secondary | ICD-10-CM | POA: Diagnosis not present

## 2022-08-03 DIAGNOSIS — Z1231 Encounter for screening mammogram for malignant neoplasm of breast: Secondary | ICD-10-CM

## 2022-08-03 DIAGNOSIS — G4700 Insomnia, unspecified: Secondary | ICD-10-CM | POA: Diagnosis not present

## 2022-08-03 DIAGNOSIS — M8580 Other specified disorders of bone density and structure, unspecified site: Secondary | ICD-10-CM | POA: Diagnosis not present

## 2022-08-03 DIAGNOSIS — E559 Vitamin D deficiency, unspecified: Secondary | ICD-10-CM | POA: Diagnosis not present

## 2022-08-03 LAB — CBC WITH DIFFERENTIAL/PLATELET
Basophils Absolute: 0.1 10*3/uL (ref 0.0–0.1)
Basophils Relative: 0.9 % (ref 0.0–3.0)
Eosinophils Absolute: 0.3 10*3/uL (ref 0.0–0.7)
Eosinophils Relative: 4.3 % (ref 0.0–5.0)
HCT: 42.3 % (ref 36.0–46.0)
Hemoglobin: 14.1 g/dL (ref 12.0–15.0)
Lymphocytes Relative: 24 % (ref 12.0–46.0)
Lymphs Abs: 1.6 10*3/uL (ref 0.7–4.0)
MCHC: 33.3 g/dL (ref 30.0–36.0)
MCV: 92.5 fl (ref 78.0–100.0)
Monocytes Absolute: 0.5 10*3/uL (ref 0.1–1.0)
Monocytes Relative: 7.7 % (ref 3.0–12.0)
Neutro Abs: 4.3 10*3/uL (ref 1.4–7.7)
Neutrophils Relative %: 63.1 % (ref 43.0–77.0)
Platelets: 180 10*3/uL (ref 150.0–400.0)
RBC: 4.57 Mil/uL (ref 3.87–5.11)
RDW: 13.6 % (ref 11.5–15.5)
WBC: 6.8 10*3/uL (ref 4.0–10.5)

## 2022-08-03 LAB — COMPREHENSIVE METABOLIC PANEL
ALT: 10 U/L (ref 0–35)
AST: 16 U/L (ref 0–37)
Albumin: 4.3 g/dL (ref 3.5–5.2)
Alkaline Phosphatase: 44 U/L (ref 39–117)
BUN: 17 mg/dL (ref 6–23)
CO2: 30 mEq/L (ref 19–32)
Calcium: 9.3 mg/dL (ref 8.4–10.5)
Chloride: 106 mEq/L (ref 96–112)
Creatinine, Ser: 0.67 mg/dL (ref 0.40–1.20)
GFR: 80.42 mL/min (ref >60.00)
Glucose, Bld: 89 mg/dL (ref 70–99)
Potassium: 3.8 mEq/L (ref 3.5–5.1)
Sodium: 141 mEq/L (ref 135–145)
Total Bilirubin: 0.6 mg/dL (ref 0.2–1.2)
Total Protein: 6.8 g/dL (ref 6.0–8.3)

## 2022-08-03 LAB — VITAMIN D 25 HYDROXY (VIT D DEFICIENCY, FRACTURES): VITD: 32.71 ng/mL (ref 30.00–100.00)

## 2022-08-03 LAB — LIPID PANEL
Cholesterol: 178 mg/dL (ref 0–200)
HDL: 51.4 mg/dL (ref >39.00)
LDL Cholesterol: 107 mg/dL — ABNORMAL HIGH (ref 0–99)
NonHDL: 126.12
Total CHOL/HDL Ratio: 3
Triglycerides: 95 mg/dL (ref 0.0–149.0)
VLDL: 19 mg/dL (ref 0.0–40.0)

## 2022-08-03 LAB — VITAMIN B12: Vitamin B-12: 309 pg/mL (ref 211–911)

## 2022-08-03 MED ORDER — PREVNAR 20 0.5 ML IM SUSY: 0.5000 mL | PREFILLED_SYRINGE | INTRAMUSCULAR | 0 refills | Status: AC

## 2022-08-03 MED ORDER — RSVPREF3 VAC RECOMB ADJUVANTED 120 MCG/0.5ML IM SUSR: 0.5000 mL | Freq: Once | INTRAMUSCULAR | 0 refills | Status: AC

## 2022-08-03 MED ORDER — SHINGRIX 50 MCG/0.5ML IM SUSR
0.5000 mL | Freq: Once | INTRAMUSCULAR | 1 refills | Status: AC
Start: 2022-08-03 — End: 2022-08-03

## 2022-08-03 MED ORDER — DENOSUMAB 60 MG/ML ~~LOC~~ SOSY: 60.0000 mg | PREFILLED_SYRINGE | Freq: Once | SUBCUTANEOUS | Status: DC

## 2022-08-03 MED ORDER — ESZOPICLONE 2 MG PO TABS: ORAL_TABLET | ORAL | 0 refills | Status: DC

## 2022-08-03 MED ORDER — SIMVASTATIN 10 MG PO TABS: ORAL_TABLET | ORAL | 11 refills | Status: DC

## 2022-08-03 NOTE — Patient Instructions (Addendum)
Consider flu shot for 65 and up   Prevar 20 x 1 consider   RSV vaccine consider   Consider shingrix x 2 doses (consider) 2nd dose due 2 months to less than 6 months    Consider covid 19 booster   Consider referral to endocrine  Linna Hoff or [place]  MD Physician   Primary Contact Information  Phone Fax E-mail Address  [phone] [email or web address] [place] [address]     Specialties     Endocrinology             Title Resident? Provider type  MD No Physician   Primary Contact Information  Phone Fax E-mail Address  [phone] Not available [address]   [address]   [place] [address]     Specialties        Zoster Vaccine, Recombinant injection What is this medication? ZOSTER VACCINE (ZOS ter vak SEEN) is a vaccine used to reduce the risk of getting shingles. This vaccine is not used to treat shingles or nerve pain from shingles. This medicine may be used for other purposes; ask your health care provider or pharmacist if you have questions. COMMON BRAND NAME(S): [facility] What should I tell my care team before I take this medication? They need to know if you have any of these conditions: cancer immune system problems an unusual or allergic reaction to Zoster vaccine, other medications, foods, dyes, or preservatives pregnant or trying to get pregnant breast-feeding How should I use this medication? This vaccine is injected into a muscle. It is given by a health care provider. A copy of Vaccine Information Statements will be given before each vaccination. Be sure to read this information carefully each time. This sheet may change often. Talk to your health care provider about the use of this vaccine in children. This vaccine is not approved for use in children. Overdosage: If you think you have taken too much of this medicine contact a poison control center or emergency room at  once. NOTE: This medicine is only for you. Do not share this medicine with others. What if I miss a dose? Keep appointments for follow-up (booster) doses. It is important not to miss your dose. Call your health care provider if you are unable to keep an appointment. What may interact with this medication? medicines that suppress your immune system medicines to treat cancer steroid medicines like prednisone or cortisone This list may not describe all possible interactions. Give your health care provider a list of all the medicines, herbs, non-prescription drugs, or dietary supplements you use. Also tell them if you smoke, drink alcohol, or use illegal drugs. Some items may interact with your medicine. What should I watch for while using this medication? Visit your health care provider regularly. This vaccine, like all vaccines, may not fully protect everyone. What side effects may I notice from receiving this medication? Side effects that you should report to your doctor or health care professional as soon as possible: allergic reactions (skin rash, itching or hives; swelling of the face, lips, or tongue) trouble breathing Side effects that usually do not require medical attention (report these to your doctor or health care professional if they continue or are bothersome): chills headache fever nausea pain, redness, or irritation at site where injected tiredness vomiting This list may not describe all possible side effects. Call your doctor for medical advice about side effects. You may report side effects to FDA at 1-800-FDA-1088.  Where should I keep my medication? This vaccine is only given by a health care provider. It will not be stored at home. NOTE: This sheet is a summary. It may not cover all possible information. If you have questions about this medicine, talk to your doctor, pharmacist, or health care provider.  2023 Elsevier/Gold Standard (2021-09-10 00:00:00)  Pneumococcal  Conjugate Vaccine (Prevnar 20) Suspension for Injection What is this medication? PNEUMOCOCCAL VACCINE (NEU mo KOK al vak SEEN) is a vaccine. It prevents pneumococcus bacterial infections. These bacteria can cause serious infections like pneumonia, meningitis, and blood infections. This vaccine will not treat an infection and will not cause infection. This vaccine is recommended for adults 18 years and older. This medicine may be used for other purposes; ask your health care provider or pharmacist if you have questions. COMMON BRAND NAME(S): Prevnar 20 What should I tell my care team before I take this medication? They need to know if you have any of these conditions: bleeding disorder fever immune system problems an unusual or allergic reaction to pneumococcal vaccine, diphtheria toxoid, other vaccines, other medicines, foods, dyes, or preservatives pregnant or trying to get pregnant breast-feeding How should I use this medication? This vaccine is injected into a muscle. It is given by a health care provider. A copy of Vaccine Information Statements will be given before each vaccination. Be sure to read this information carefully each time. This sheet may change often. Talk to your health care provider about the use of this medicine in children. Special care may be needed. Overdosage: If you think you have taken too much of this medicine contact a poison control center or emergency room at once. NOTE: This medicine is only for you. Do not share this medicine with others. What if I miss a dose? This does not apply. This medicine is not for regular use. What may interact with this medication? medicines for cancer chemotherapy medicines that suppress your immune function steroid medicines like prednisone or cortisone This list may not describe all possible interactions. Give your health care provider a list of all the medicines, herbs, non-prescription drugs, or dietary supplements you use.  Also tell them if you smoke, drink alcohol, or use illegal drugs. Some items may interact with your medicine. What should I watch for while using this medication? Mild fever and pain should go away in 3 days or less. Report any unusual symptoms to your health care provider. What side effects may I notice from receiving this medication? Side effects that you should report to your doctor or health care professional as soon as possible: allergic reactions (skin rash, itching or hives; swelling of the face, lips, or tongue) confusion fast, irregular heartbeat fever over 102 degrees F muscle weakness seizures trouble breathing unusual bruising or bleeding Side effects that usually do not require medical attention (report to your doctor or health care professional if they continue or are bothersome): fever of 102 degrees F or less headache joint pain muscle cramps, pain pain, tender at site where injected This list may not describe all possible side effects. Call your doctor for medical advice about side effects. You may report side effects to FDA at 1-800-FDA-1088. Where should I keep my medication? This vaccine is only given by a health care provider. It will not be stored at home. NOTE: This sheet is a summary. It may not cover all possible information. If you have questions about this medicine, talk to your doctor, pharmacist, or health care provider.  2023  Elsevier/Gold Standard (2020-06-12 00:00:00)

## 2022-08-03 NOTE — Telephone Encounter (Signed)
Pt would like to be called regards to a prevnar shot

## 2022-08-03 NOTE — Progress Notes (Signed)
Prolia 60 mg/mL  injection was given to the pt in the upper left arm. Pt has tolerated injection well.   Annette Lee, CMA

## 2022-08-03 NOTE — Progress Notes (Signed)
Chief Complaint  Patient presents with   Follow-up    Pt also needed her prolia shot and pt would like a refilll on lunesta   F/u  1. Osteoporosis T score -2.2 and high frax scores on prolia will refer endocrine to disc cont prolia or reclast  Prolia shot today   2. Refills of chronic meds     Review of Systems  Constitutional:  Negative for weight loss.  HENT:  Negative for hearing loss.   Eyes:  Negative for blurred vision.  Respiratory:  Negative for shortness of breath.   Cardiovascular:  Negative for chest pain.  Gastrointestinal:  Negative for abdominal pain and blood in stool.  Genitourinary:  Negative for dysuria.  Musculoskeletal:  Negative for falls and joint pain.  Skin:  Negative for rash.  Neurological:  Negative for headaches.  Psychiatric/Behavioral:  Negative for depression.    Past Medical History:  Diagnosis Date   Allergic rhinitis due to pollen    Blood present in stool    Cancer ([place])    bcc forehead s/p Mohs Dr. Roel Cluck (of note [facility]msc x 2 face and 1x neck)    COVID-19 04/28/2021   [place] mild sxs   Endometriosis    Environmental and seasonal allergies    GERD (gastroesophageal reflux disease)    Hyperlipidemia    Osteopenia 08/2011   T -1.8 in hip   Osteoporosis    Sleep disturbance    UTI (urinary tract infection)    Vaginal atrophy    Past Surgical History:  Procedure Laterality Date   ABDOMINAL HYSTERECTOMY     total    APPENDECTOMY     CATARACT EXTRACTION W/ INTRAOCULAR LENS  IMPLANT, BILATERAL     COLONOSCOPY WITH PROPOFOL N/A 01/22/2016   Procedure: COLONOSCOPY WITH PROPOFOL;  Surgeon: Josefine Class, MD;  Location[facility] ENDOSCOPY;  Service: Endoscopy;  Laterality: N/A;   ROTATOR CUFF REPAIR Right ~2001   SHOULDER ARTHROSCOPY WITH SUBACROMIAL DECOMPRESSION AND BICEP TENDON REPAIR Left 11/08/2018   Procedure: SHOULDER ARTHROSCOPY WITH DEBRIDEMENT, DECOMPRESSION AND ROTATOR CUFF REPAIR;  Surgeon: Corky Mull, MD;   Location: [facility] ORS;  Service: Orthopedics;  Laterality: Left;   Family History  Problem Relation Age of Onset   Cancer Mother        CLL   Cancer Father        colon   Aneurysm Father    Cancer Brother        colon cancer   Heart disease Maternal Uncle    Diabetes Neg Hx    Breast cancer Neg Hx    Bladder Cancer Neg Hx    Kidney cancer Neg Hx    Social History   Socioeconomic History   Marital status: Married    Spouse name: Douglass   Number of children: 0   Years of education: Not on file   Highest education level: Not on file  Occupational History   Occupation: Scientist, water quality    Comment: Retired  Tobacco Use   Smoking status: Former    Types: Cigarettes    Quit date: 10/30/1966    Years since quitting: 55.7   Smokeless tobacco: Never   Tobacco comments:    as of 08/2017 quit smoking >15 years ago 1ppd x 35 years no FH lung cancer   Vaping Use   Vaping Use: Never used  Substance and Sexual Activity   Alcohol use: Yes    Alcohol/week: 2.0 standard drinks of alcohol  Types: 2 Shots of liquor per week    Comment: drinks gin per week   Drug use: No   Sexual activity: Never  Other Topics Concern   Not on file  Social History Narrative   Has living will   Husband is health care POA.   Would accept resuscitation but no prolonged ventilation   Not sure about tube feeds   Moved her from [place]    Exercises 3x per week    No kids    Social Determinants of Health   Financial Resource Strain: Low Risk  (07/20/2020)   Overall Financial Resource Strain (CARDIA)    Difficulty of Paying Living Expenses: Not hard at all  Food Insecurity: No Food Insecurity (07/20/2020)   Hunger Vital Sign    Worried About Running Out of Food in the Last Year: Never true    Ran Out of Food in the Last Year: Never true  Transportation Needs: No Transportation Needs (07/20/2020)   PRAPARE - Hydrologist (Medical): No    Lack of  Transportation (Non-Medical): No  Physical Activity: Sufficiently Active (07/16/2018)   Exercise Vital Sign    Days of Exercise per Week: 4 days    Minutes of Exercise per Session: 60 min  Stress: No Stress Concern Present (07/20/2020)   [place]    Feeling of Stress : Not at all  Social Connections: Unknown (07/20/2020)   Social Connection and Isolation Panel [NHANES]    Frequency of Communication with Friends and Family: Not on file    Frequency of Social Gatherings with Friends and Family: Not on file    Attends Religious Services: Not on file    Active Member of Clubs or Organizations: Not on file    Attends Archivist Meetings: Not on file    Marital Status: Married  Intimate Partner Violence: Not At Risk (07/20/2020)   Humiliation, Afraid, Rape, and Kick questionnaire    Fear of Current or Ex-Partner: No    Emotionally Abused: No    Physically Abused: No    Sexually Abused: No   Current Meds  Medication Sig   DODEX 1000 MCG/ML injection INJECT 1 ML IN THE MUSCLE EVERY 30 DAYS   NEEDLE, DISP, 25 G 25G X 1-1/2" MISC 1 Device by Does not apply route every 30 (thirty) days.   pneumococcal 20-valent conjugate vaccine (PREVNAR 20) 0.5 ML injection Inject 0.5 mLs into the muscle tomorrow at 10 am for 1 dose.   RSV vaccine recomb adjuvanted (AREXVY) 120 MCG/0.5ML injection Inject 0.5 mLs into the muscle once for 1 dose.   Zoster Vaccine Adjuvanted [facility]) injection Inject 0.5 mLs into the muscle once for 1 dose.   [DISCONTINUED] eszopiclone (LUNESTA) 2 MG TABS tablet TAKE 1 TABLET(2 MG) BY MOUTH AT BEDTIME AS NEEDED FOR SLEEP   [DISCONTINUED] simvastatin (ZOCOR) 10 MG tablet TAKE 1 TABLET BY MOUTH EVERY MONDAY   Current Facility-Administered Medications for the 08/03/22 encounter (Office Visit) with McLean-Scocuzza, Nino Glow, MD  Medication   denosumab (PROLIA) injection 60 mg   Allergies  Allergen  Reactions   Contrast Media [Iodinated Contrast Media] Anaphylaxis   No results found for this or any previous visit (from the past 2160 hour(s)). Objective  Body mass index is 22.78 kg/m. Wt Readings from Last 3 Encounters:  08/03/22 132 lb 12.8 oz (60.2 kg)  02/22/22 133 lb 6.4 oz (60.5 kg)  11/10/21 131 lb (59.4 kg)  Temp Readings from Last 3 Encounters:  08/03/22 98.1 F (36.7 C) (Oral)  02/22/22 98 F (36.7 C) (Oral)  11/10/21 (!) 97 F (36.1 C) (Temporal)   BP Readings from Last 3 Encounters:  08/03/22 (!) 118/58  02/22/22 124/70  11/10/21 124/68   Pulse Readings from Last 3 Encounters:  08/03/22 63  02/22/22 77  11/10/21 71    Physical Exam Vitals and nursing note reviewed.  Constitutional:      Appearance: Normal appearance. She is well-developed and well-groomed.  HENT:     Head: Normocephalic and atraumatic.  Eyes:     Conjunctiva/sclera: Conjunctivae normal.     Pupils: Pupils are equal, round, and reactive to light.  Cardiovascular:     Rate and Rhythm: Normal rate and regular rhythm.     Heart sounds: Normal heart sounds. No murmur heard. Pulmonary:     Effort: Pulmonary effort is normal.     Breath sounds: Normal breath sounds.  Abdominal:     General: Abdomen is flat. Bowel sounds are normal.     Tenderness: There is no abdominal tenderness.  Musculoskeletal:        General: No tenderness.  Skin:    General: Skin is warm and dry.  Neurological:     General: No focal deficit present.     Mental Status: She is alert and oriented to person, place, and time. Mental status is at baseline.     Cranial Nerves: Cranial nerves 2-12 are intact.     Motor: Motor function is intact.     Coordination: Coordination is intact.     Gait: Gait is intact.  Psychiatric:        Attention and Perception: Attention and perception normal.        Mood and Affect: Mood and affect normal.        Speech: Speech normal.        Behavior: Behavior normal. Behavior is  cooperative.        Thought Content: Thought content normal.        Cognition and Memory: Cognition and memory normal.        Judgment: Judgment normal.     Assessment  Plan  Osteoporosis, unspecified osteoporosis type, unspecified pathological fracture presence  with high FRAX scores- Plan: Comprehensive metabolic panel, CBC with Differential/Platelet, Ambulatory referral to Endocrinology,  Prolia given today See hpi  Need for RSV vaccination - Plan: RSV vaccine recomb adjuvanted (AREXVY) 120 MCG/0.5ML injection  Need for pneumococcal 20-valent conjugate vaccination - Plan: pneumococcal 20-valent conjugate vaccine (PREVNAR 20) 0.5 ML injection  Need for shingles vaccine - Plan: Zoster Vaccine Adjuvanted [facility]) injection   Hyperlipidemia, unspecified hyperlipidemia type - Plan: Comprehensive metabolic panel, Lipid panel, CBC with Differential/Platelet, simvastatin (ZOCOR) 10 MG tablet qmondays   Insomnia, unspecified type - Plan: eszopiclone (LUNESTA) 2 MG TABS tablet    HM Flu shot utd  Had pna vaccines x 2  -pna 23  utd no need for futher Had zostervax  -given Rx shingrix x 2 doses  Tdap had 12/25/17   covid vx had 4/4 moderna consider 5th dose   Out of pap window s/p total hysterectomy just saw OB/GYN 12/2017   mammo neg 02/03/20 negative ordered sch 02/03/21 , ordered 2023  02/07/22      dexa 11/2019 osteopenia high FRAX score - 01/29/20 prolia ordered had dose 07/30/20; ordered 2023 sch prolia 02/01/22 f/u in 6 months Dexa had 02/07/22 +osteoporosis on prolia high frax score  Colonoscopy: 01/22/16 Dr. Rayann Heman h/o colon polpys.  colonoscopy was difficult per report and she didn't like the MD who did the procedure. Reviewed +IH and adhesions making exam difficult.    cologuard done 02/03/16 repeat due 02/03/2019 until age 83 reviewed with pt ok with this plan will re ordered closer to date  -+cologuard 02/08/19  -colonoscopy 03/05/19 IH otherwise neg   Saw dermatology on  [place] 06/16/17 had [place] [facility]2 f/u in 1 year, recently saw 06/2019  H/o nmsc x 2 face and 1 neck h/o mohs [facility] Dr. Manley Mason   dermatology appt 07/13/20 had Hassan Rowan wnl f/u in 1 year sch 07/20/21    She is former smoker quit >15 years ago smoked 1 ppd x 35 yeas no FH lung cancer  -consider CT low dose if insurance will cover in future      Cont exercise 3x per week  Provider: Dr. Olivia Mackie McLean-Scocuzza-Internal Medicine

## 2022-08-04 ENCOUNTER — Ambulatory Visit (INDEPENDENT_AMBULATORY_CARE_PROVIDER_SITE_OTHER): Payer: Medicare Other

## 2022-08-04 DIAGNOSIS — Z23 Encounter for immunization: Secondary | ICD-10-CM | POA: Diagnosis not present

## 2022-08-04 NOTE — Telephone Encounter (Signed)
Pt is here to get prevnar 20 shot this morning

## 2022-08-04 NOTE — Telephone Encounter (Signed)
Agree Dr. Briyan Kleven McLean-Scocuzza  

## 2022-08-09 ENCOUNTER — Encounter: Payer: Self-pay | Admitting: Internal Medicine

## 2022-08-09 DIAGNOSIS — Z23 Encounter for immunization: Secondary | ICD-10-CM | POA: Diagnosis not present

## 2022-08-12 ENCOUNTER — Encounter: Payer: Self-pay | Admitting: Internal Medicine

## 2022-08-18 ENCOUNTER — Ambulatory Visit: Payer: Medicare Other

## 2022-08-18 MED ORDER — ZOSTER VAC RECOMB ADJUVANTED 50 MCG/0.5ML IM SUSR: 0.5000 mL | Freq: Once | INTRAMUSCULAR | 0 refills | Status: AC

## 2022-08-18 NOTE — Progress Notes (Signed)
Pt arrived for shingles vaccine, pt was unable to receive here in ofc due to pt being insured by Medicare & would be charged for administration. Instructed pt that she would be able to receive this vaccine at the pharmacy free of charge. Shingrix rx was printed & signed by Dr.Walsh for pt to be able to receive vaccination at pharmacy. Pt verbalized understanding to info.

## 2022-09-02 DIAGNOSIS — Z23 Encounter for immunization: Secondary | ICD-10-CM | POA: Diagnosis not present

## 2023-01-02 ENCOUNTER — Telehealth: Payer: Self-pay | Admitting: Internal Medicine

## 2023-01-02 NOTE — Telephone Encounter (Signed)
Pt has no PCP at this time, And she is not due until on or after 02/02/23.  I will have to change her to Dr. Volanda Napoleon & she can get her Prolia at her 4/18 appt. She can not get it prior to that appt because she has to establish care with a PCP first.  Tried to reach patient, but the number in chart is not in service.

## 2023-01-02 NOTE — Telephone Encounter (Signed)
Patient called and message read.

## 2023-01-02 NOTE — Telephone Encounter (Signed)
Pt called stating her prolia shot is due on 3/27 and she wants to schedule an appointment

## 2023-01-09 ENCOUNTER — Telehealth: Payer: Self-pay | Admitting: *Deleted

## 2023-01-09 NOTE — Telephone Encounter (Signed)
$  0 due; no PA req'd will get Prolia inject at visit with Dr. Volanda Napoleon on 4.18.24

## 2023-01-31 DIAGNOSIS — Z23 Encounter for immunization: Secondary | ICD-10-CM | POA: Diagnosis not present

## 2023-02-09 ENCOUNTER — Encounter: Payer: Medicare Other | Admitting: Family Medicine

## 2023-02-13 ENCOUNTER — Encounter: Payer: Self-pay | Admitting: *Deleted

## 2023-02-14 ENCOUNTER — Ambulatory Visit (INDEPENDENT_AMBULATORY_CARE_PROVIDER_SITE_OTHER): Payer: Medicare Other | Admitting: *Deleted

## 2023-02-14 DIAGNOSIS — M810 Age-related osteoporosis without current pathological fracture: Secondary | ICD-10-CM

## 2023-02-14 DIAGNOSIS — M8580 Other specified disorders of bone density and structure, unspecified site: Secondary | ICD-10-CM | POA: Diagnosis not present

## 2023-02-14 NOTE — Progress Notes (Signed)
Pt received Prolia injection in right arm (subcu)-Pt tolerated it well with no concerns.

## 2023-02-21 ENCOUNTER — Ambulatory Visit
Admission: RE | Admit: 2023-02-21 | Discharge: 2023-02-21 | Disposition: A | Discharge status: Home / Self Care | Payer: Medicare Other | Source: Ambulatory Visit | Attending: Internal Medicine | Admitting: Internal Medicine

## 2023-02-21 DIAGNOSIS — Z1231 Encounter for screening mammogram for malignant neoplasm of breast: Secondary | ICD-10-CM

## 2023-02-27 DIAGNOSIS — L814 Other melanin hyperpigmentation: Secondary | ICD-10-CM | POA: Diagnosis not present

## 2023-02-27 DIAGNOSIS — L821 Other seborrheic keratosis: Secondary | ICD-10-CM | POA: Diagnosis not present

## 2023-02-27 DIAGNOSIS — L570 Actinic keratosis: Secondary | ICD-10-CM | POA: Diagnosis not present

## 2023-03-17 ENCOUNTER — Telehealth: Payer: Self-pay

## 2023-03-17 ENCOUNTER — Encounter: Payer: Self-pay | Admitting: Student

## 2023-03-17 ENCOUNTER — Ambulatory Visit: Payer: Medicare Other | Admitting: Student

## 2023-03-17 VITALS — BP 124/72 | HR 68 | Temp 97.6°F | Ht 64.0 in | Wt 135.0 lb

## 2023-03-17 DIAGNOSIS — Z9189 Other specified personal risk factors, not elsewhere classified: Secondary | ICD-10-CM | POA: Diagnosis not present

## 2023-03-17 DIAGNOSIS — G479 Sleep disorder, unspecified: Secondary | ICD-10-CM | POA: Diagnosis not present

## 2023-03-17 DIAGNOSIS — M810 Age-related osteoporosis without current pathological fracture: Secondary | ICD-10-CM

## 2023-03-17 DIAGNOSIS — Z79899 Other long term (current) drug therapy: Secondary | ICD-10-CM | POA: Diagnosis not present

## 2023-03-17 DIAGNOSIS — E538 Deficiency of other specified B group vitamins: Secondary | ICD-10-CM | POA: Diagnosis not present

## 2023-03-17 DIAGNOSIS — G3184 Mild cognitive impairment, so stated: Secondary | ICD-10-CM

## 2023-03-17 MED ORDER — ESZOPICLONE 2 MG PO TABS: 2.0000 mg | ORAL_TABLET | Freq: Every evening | ORAL | 1 refills | Status: DC | PRN

## 2023-03-17 NOTE — Patient Instructions (Addendum)
Please come to the clinic on Thursday 5/30 at 7:30 AM to get your labs collected.

## 2023-03-17 NOTE — Telephone Encounter (Signed)
Approval received and valid from 02/15/23-03/16/24  Detailed message left on pharmacy voicemail informing them of approval (which will prompt them to run rx again and prepare for pick-up).

## 2023-03-17 NOTE — Telephone Encounter (Signed)
Incoming fax received from patients pharmacy to initiate a prior authorization for   Lunesta (eszopiclone) 2 mg tablet               .  Side Note: RXBIN Number [id number]  PA initiated through covermymeds. Key: [id number]  Awaiting reply from the insurance company which will be determined in 48-72 hours.

## 2023-03-17 NOTE — Progress Notes (Signed)
Location:  [facility].   Provider: Dr. Earnestine Mealing  Code Status:  Goals of Care:     03/17/2023    2:19 PM  Advanced Directives  Does Patient Have a Medical Advance Directive? Yes  Type of Advance Directive Healthcare Power of Attorney  Does patient want to make changes to medical advance directive? No - Patient declined  Copy of Healthcare Power of Attorney in Chart? No - copy requested     Chief Complaint  Patient presents with  . Establish Care    Establish Care.     HPI: Patient is a 87 y.o. female seen today to Establish care  Acute concern: if she goes to bed too early she lays awake. She goes to bed at 11pm and is fine. She wakes up every ~30 minutes or so at times. This has been taking Lunesta since ~2018. She doesn't use it that often-- her current prescription is from July 2023. She has never taken any other prescription before. She was drinking something at lunch that kind of kept her awake which she avoid.  She drinks no caffeine pepsi rarely and decaffienated coffee. Drinks Black tea at breakfast. She typically takes one when she is travelling and disruption of her daily routine. In the last 6 years she has only had one refill.   Medications:  Lunesta - discussed black box warning and concern Cholesterol -  Prolia - she has been on prolia   Mentation: Short term memory has been an issue. Walking into a room and finding she can't remember. SLUMS today was 25.   Mobility: She  She goes for walks 3x a week 1-2 miles. She has not had any falls.   Multimorbidity: Osteoporosis is the main issue.   Matters Most: Cooking with and for her husband.   She was born in [place] and moved here from [place].She has been here for 10 years. She lives here with her husband. She was in [place] then [place] and then [place]. She met her husband in [place]. Went to [place] and then. 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 They travel to destinations and sing. They don't have or want children. 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    Past Medical History:  Diagnosis Date  . Allergic rhinitis due to pollen   . Blood present in stool   . 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 COVID-19 04/28/2021   [place] mild sxs  . Endometriosis   . Environmental and seasonal allergies   . GERD (gastroesophageal reflux disease)   . Hyperlipidemia   . Osteopenia 08/2011   T -1.8 in hip  . Osteoporosis   . Sleep disturbance   . UTI (urinary tract infection)   . Vaginal atrophy     Past Surgical History:  Procedure Laterality Date  . ABDOMINAL HYSTERECTOMY     total   . APPENDECTOMY    . CATARACT EXTRACTION W/ INTRAOCULAR LENS  IMPLANT, BILATERAL    . COLONOSCOPY WITH PROPOFOL N/A 01/22/2016   Procedure: COLONOSCOPY WITH PROPOFOL;  Surgeon: Elnita Maxwell, MD;  Location: [facility] END[facility]COPY;  Service: Endoscopy;  Laterality: N/A;  . ROTATOR CUFF REPAIR Right ~  2001  . SHOULDER ARTHROSCOPY WITH SUBACROMIAL DECOMPRESSION AND BICEP TENDON REPAIR Left 11/08/2018   Procedure: SHOULDER ARTHROSCOPY WITH DEBRIDEMENT, DECOMPRESSION AND ROTATOR CUFF REPAIR;  Surgeon: Christena Flake, MD;  Location: [facility] ORS;  Service: Orthopedics;  Laterality: Left;    Allergies  Allergen Reactions  . Contrast Media [Iodinated Contrast Media] Anaphylaxis    Outpatient Encounter Medications as of 03/17/2023  Medication Sig  . DODEX 1000 MCG/ML injection INJECT 1 ML IN THE MUSCLE EVERY 30 DAYS  . eszopiclone (LUNESTA) 2 MG TABS tablet Take 2 mg by mouth at bedtime as needed for sleep. Take immediately before bedtime  . mometasone (ALLERGY NASAL SPRAY) 50 MCG/ACT nasal spray Place 2 sprays into the nose daily as needed.  Marland Kitchen NEEDLE, DISP, 25 G 25G X 1-1/2"  MISC 1 Device by Does not apply route every 30 (thirty) days.  . simvastatin (ZOCOR) 10 MG tablet Take 10 mg by mouth daily.  . [DISCONTINUED] simvastatin (ZOCOR) 10 MG tablet Weekly 1x after 6pm (Patient taking differently: Take 10 mg by mouth daily.)  . [DISCONTINUED] eszopiclone (LUNESTA) 2 MG TABS tablet Take immediately before bedtime on Monday   Facility-Administered Encounter Medications as of 03/17/2023  Medication  . denosumab (PROLIA) injection 60 mg    Review of Systems:  Review of Systems  Health Maintenance  Topic Date Due  . Medicare Annual Wellness (AWV)  07/20/2021  . COVID-19 Vaccine (6 - 2023-24 season) 03/28/2023  . INFLUENZA VACCINE  05/25/2023  . DTaP/Tdap/Td (2 - Td or Tdap) 12/26/2027  . Pneumonia Vaccine 26+ Years old  Completed  . DEXA SCAN  Completed  . Zoster Vaccines- Shingrix  Completed  . HPV VACCINES  Aged Out    Physical Exam: Vitals:   03/17/23 1407  BP: 124/72  Pulse: 68  Temp: 97.6 F (36.4 C)  SpO2: 98%  Weight: 135 lb (61.2 kg)  Height: 5\' 4"  (1.626 m)   Body mass index is 23.17 kg/m. Physical Exam  Labs reviewed: Basic Metabolic Panel: Recent Labs    08/03/22 1055  NA 141  K 3.8  CL 106  CO2 30  GLUCOSE 89  BUN 17  CREATININE 0.67  CALCIUM 9.3   Liver Function Tests: Recent Labs    08/03/22 1055  AST 16  ALT 10  ALKPHOS 44  BILITOT 0.6  PROT 6.8  ALBUMIN 4.3   No results for input(s): "LIPASE", "AMYLASE" in the last 8760 hours. No results for input(s): "AMMONIA" in the last 8760 hours. CBC: Recent Labs    08/03/22 1055  WBC 6.8  NEUTROABS 4.3  HGB 14.1  HCT 42.3  MCV 92.5  PLT 180.0   Lipid Panel: Recent Labs    08/03/22 1055  CHOL 178  HDL 51.40  LDLCALC 107*  TRIG 95.0  CHOLHDL 3   Lab Results  Component Value Date   HGBA1C 5.5 08/08/2018    Procedures since last visit: MM 3D SCREEN BREAST BILATERAL  Result Date: 02/22/2023 CLINICAL DATA:  Screening. EXAM: DIGITAL SCREENING BILATERAL  MAMMOGRAM WITH TOMOSYNTHESIS AND CAD TECHNIQUE: Bilateral screening digital craniocaudal and mediolateral oblique mammograms were obtained. Bilateral screening digital breast tomosynthesis was performed. The images were evaluated with computer-aided detection. COMPARISON:  Previous exam(s). ACR Breast Density Category b: There are scattered areas of fibroglandular density. FINDINGS: There are no findings suspicious for malignancy. IMPRESSION: No mammographic evidence of malignancy. A result letter of this screening mammogram will be mailed directly to the patient. RECOMMENDATION: Screening mammogram in one year. (Code:SM-B-01Y)  BI-RADS CATEGORY  1: Negative. Electronically Signed   By: Baird Lyons M.D.   On: 02/22/2023 16:51    Assessment/Plan There are no diagnoses linked to this encounter.   Labs/tests ordered:  * No order type specified * Next appt:  Visit date not found

## 2023-03-23 DIAGNOSIS — G3184 Mild cognitive impairment, so stated: Secondary | ICD-10-CM | POA: Diagnosis not present

## 2023-03-23 DIAGNOSIS — M810 Age-related osteoporosis without current pathological fracture: Secondary | ICD-10-CM | POA: Diagnosis not present

## 2023-03-23 DIAGNOSIS — E538 Deficiency of other specified B group vitamins: Secondary | ICD-10-CM | POA: Diagnosis not present

## 2023-03-23 LAB — COMPLETE METABOLIC PANEL WITH GFR
AG Ratio: 1.6 (calc) (ref 1.0–2.5)
ALT: 9 U/L (ref 6–29)
Calcium: 9.5 mg/dL (ref 8.6–10.4)
Chloride: 107 mmol/L (ref 98–110)
Creat: 0.79 mg/dL (ref 0.60–0.95)
Potassium: 4 mmol/L (ref 3.5–5.3)

## 2023-03-24 LAB — T4: T4, Total: 8 ug/dL (ref 5.1–11.9)

## 2023-03-24 LAB — VITAMIN B12: Vitamin B-12: 464 pg/mL (ref 200–1100)

## 2023-03-24 LAB — COMPLETE METABOLIC PANEL WITH GFR
AST: 16 U/L (ref 10–35)
Albumin: 4.1 g/dL (ref 3.6–5.1)
Alkaline phosphatase (APISO): 43 U/L (ref 37–153)
BUN: 17 mg/dL (ref 7–25)
CO2: 27 mmol/L (ref 20–32)
Globulin: 2.5 g/dL (calc) (ref 1.9–3.7)
Glucose, Bld: 98 mg/dL (ref 65–99)
Sodium: 141 mmol/L (ref 135–146)
Total Bilirubin: 0.6 mg/dL (ref 0.2–1.2)
Total Protein: 6.6 g/dL (ref 6.1–8.1)
eGFR: 74 mL/min/{1.73_m2} (ref >=60)

## 2023-03-24 LAB — TSH: TSH: 3.27 mIU/L (ref 0.40–4.50)

## 2023-03-24 LAB — T4, FREE: Free T4: 1 ng/dL (ref 0.8–1.8)

## 2023-03-24 LAB — VITAMIN D 25 HYDROXY (VIT D DEFICIENCY, FRACTURES): Vit D, 25-Hydroxy: 23 ng/mL — ABNORMAL LOW (ref 30–100)

## 2023-03-25 ENCOUNTER — Telehealth: Payer: Self-pay | Admitting: Student

## 2023-03-25 DIAGNOSIS — E559 Vitamin D deficiency, unspecified: Secondary | ICD-10-CM

## 2023-03-25 MED ORDER — VITAMIN D (ERGOCALCIFEROL) 1.25 MG (50000 UNIT) PO CAPS
50000.0000 [IU] | ORAL_CAPSULE | ORAL | 0 refills | Status: DC
Start: 2023-03-25 — End: 2023-06-12

## 2023-03-25 NOTE — Telephone Encounter (Signed)
Please notify patient that her most recent labs were normal except for low vitamin D. Sending a prescription to your pharmacy for supplement. It is one capsule weekly for 12 weeks. Once the supplement is complete you can go back to your typical daily supplementation.

## 2023-03-27 NOTE — Telephone Encounter (Signed)
Patient notified and agreed.  

## 2023-04-05 ENCOUNTER — Encounter: Payer: Medicare Other | Admitting: Family Medicine

## 2023-05-09 DIAGNOSIS — Z961 Presence of intraocular lens: Secondary | ICD-10-CM | POA: Diagnosis not present

## 2023-05-09 DIAGNOSIS — H52223 Regular astigmatism, bilateral: Secondary | ICD-10-CM | POA: Diagnosis not present

## 2023-05-09 DIAGNOSIS — S0502XA Injury of conjunctiva and corneal abrasion without foreign body, left eye, initial encounter: Secondary | ICD-10-CM | POA: Diagnosis not present

## 2023-05-09 DIAGNOSIS — H43813 Vitreous degeneration, bilateral: Secondary | ICD-10-CM | POA: Diagnosis not present

## 2023-05-09 DIAGNOSIS — H04123 Dry eye syndrome of bilateral lacrimal glands: Secondary | ICD-10-CM | POA: Diagnosis not present

## 2023-05-09 DIAGNOSIS — H524 Presbyopia: Secondary | ICD-10-CM | POA: Diagnosis not present

## 2023-05-09 DIAGNOSIS — H5203 Hypermetropia, bilateral: Secondary | ICD-10-CM | POA: Diagnosis not present

## 2023-05-11 DIAGNOSIS — H04123 Dry eye syndrome of bilateral lacrimal glands: Secondary | ICD-10-CM | POA: Diagnosis not present

## 2023-05-11 DIAGNOSIS — Z961 Presence of intraocular lens: Secondary | ICD-10-CM | POA: Diagnosis not present

## 2023-05-11 DIAGNOSIS — S0502XA Injury of conjunctiva and corneal abrasion without foreign body, left eye, initial encounter: Secondary | ICD-10-CM | POA: Diagnosis not present

## 2023-05-31 ENCOUNTER — Telehealth: Payer: Self-pay | Admitting: Student

## 2023-05-31 ENCOUNTER — Other Ambulatory Visit: Payer: Self-pay

## 2023-05-31 DIAGNOSIS — E785 Hyperlipidemia, unspecified: Secondary | ICD-10-CM

## 2023-05-31 NOTE — Telephone Encounter (Signed)
Has a new pt appt on 07/17/2023

## 2023-05-31 NOTE — Telephone Encounter (Signed)
RX request sent to provider. New pt appointment on 07/17/2023

## 2023-05-31 NOTE — Telephone Encounter (Signed)
Prescription Request  05/31/2023  LOV: 03/17/2023  What is the name of the medication or equipment? simvastatin   Have you contacted your pharmacy to request a refill? No   Which [facility] you [facility] this sent [facility]  Patient notified that their request is being sent to the clinical staff for review and that they should receive a response within 2 business days.   Please advise at Mobile [phone] (mobile)

## 2023-06-01 MED ORDER — SIMVASTATIN 10 MG PO TABS: 10.0000 mg | ORAL_TABLET | Freq: Every day | ORAL | 3 refills | Status: AC

## 2023-06-08 ENCOUNTER — Encounter (INDEPENDENT_AMBULATORY_CARE_PROVIDER_SITE_OTHER): Payer: Self-pay

## 2023-06-12 ENCOUNTER — Other Ambulatory Visit: Payer: Self-pay | Admitting: Student

## 2023-06-12 DIAGNOSIS — E559 Vitamin D deficiency, unspecified: Secondary | ICD-10-CM

## 2023-06-12 NOTE — Telephone Encounter (Signed)
 Patient has request refill on medication not on medication list.

## 2023-06-21 ENCOUNTER — Telehealth: Payer: Self-pay

## 2023-06-21 DIAGNOSIS — E538 Deficiency of other specified B group vitamins: Secondary | ICD-10-CM

## 2023-06-21 NOTE — Telephone Encounter (Signed)
Prescription Request  06/21/2023  LOV: Visit date not found  What is the name of the medication or equipment? Cyanocobalamin (B12) 1,000 mcg - injection  Have you contacted your pharmacy to request a refill? No   Which pharmacy would you like this sent to?   [facility] [phone] - [place], [place] - [address] [facility] [address] [facility] [address] [place] [address] Phone: [phone] Fax: [phone]    Patient notified that their request is being sent to the clinical staff for review and that they should receive a response within 2 business days.   Please advise at [phone] (husband's phone [place] are [place] the beach)  Patient states she is out of this medication.  Patient states she is scheduled for a transfer of care appointment with Dr. Dana Allan in September.  Patient asks that we please call her when prescription has been sent in.

## 2023-06-22 MED ORDER — CYANOCOBALAMIN 1000 MCG/ML IJ SOLN
1000.0000 ug | INTRAMUSCULAR | 5 refills | Status: DC
Start: 2023-06-22 — End: 2023-11-06

## 2023-06-22 NOTE — Telephone Encounter (Signed)
Rx sent to [facility] [phone] [facility].

## 2023-07-17 ENCOUNTER — Ambulatory Visit (INDEPENDENT_AMBULATORY_CARE_PROVIDER_SITE_OTHER): Payer: Medicare Other | Admitting: Family Medicine

## 2023-07-17 ENCOUNTER — Encounter: Payer: Self-pay | Admitting: Family Medicine

## 2023-07-17 VITALS — BP 132/66 | HR 68 | Temp 97.9°F | Resp 16 | Ht 64.0 in | Wt 132.1 lb

## 2023-07-17 DIAGNOSIS — E785 Hyperlipidemia, unspecified: Secondary | ICD-10-CM | POA: Diagnosis not present

## 2023-07-17 DIAGNOSIS — G479 Sleep disorder, unspecified: Secondary | ICD-10-CM | POA: Diagnosis not present

## 2023-07-17 DIAGNOSIS — L600 Ingrowing nail: Secondary | ICD-10-CM | POA: Diagnosis not present

## 2023-07-17 NOTE — Patient Instructions (Addendum)
It was a pleasure meeting you today. Thank you for allowing me to take part in your health care.  Our goals for today as we discussed include:  Recommend scheduling appointment with [facility] for right toe pain Referral sent for evaluation    If you have any questions or concerns, please do not hesitate to call the office at [phone].  I look forward to our next visit and until then take care and stay safe.  Regards,   Dana Allan, MD   [facility]

## 2023-07-17 NOTE — Progress Notes (Signed)
SUBJECTIVE:   Chief Complaint  Patient presents with   Establish Care   HPI Presents to clinic to establish care  No acute concerns today  Hyperlipidemia Taking Zocor 10 mg weekly.  Prescribed daily.  Endorses having taking weekly for at least 13 years.   Sleep disorder Prescribed Lunesta 2 mg at night as needed.  Reports taking 1 tablet monthly.     Toe pain Right ingrown toenail.  Tender without signs of infection.    PERTINENT PMH / PSH: As above   OBJECTIVE:  BP 132/66   Pulse 68   Temp 97.9 F (36.6 C)   Resp 16   Ht 5\' 4"  (1.626 m)   Wt 132 lb 2 oz (59.9 kg)   SpO2 96%   BMI 22.68 kg/m    Physical Exam Vitals reviewed.  Constitutional:      General: She is not in acute distress.    Appearance: She is not ill-appearing.  HENT:     Head: Normocephalic.     Right Ear: Tympanic membrane, ear canal and external ear normal.     Left Ear: Tympanic membrane, ear canal and external ear normal.     Nose: Nose normal.     Mouth/Throat:     Mouth: Mucous membranes are moist.  Eyes:     Extraocular Movements: Extraocular movements intact.     Conjunctiva/sclera: Conjunctivae normal.     Pupils: Pupils are equal, round, and reactive to light.  Neck:     Thyroid: No thyromegaly or thyroid tenderness.     Vascular: No carotid bruit.  Cardiovascular:     Rate and Rhythm: Normal rate and regular rhythm.     Pulses: Normal pulses.     Heart sounds: Normal heart sounds.  Pulmonary:     Effort: Pulmonary effort is normal.     Breath sounds: Normal breath sounds.  Abdominal:     General: Bowel sounds are normal. There is no distension.     Palpations: Abdomen is soft.     Tenderness: There is no abdominal tenderness. There is no right CVA tenderness, left CVA tenderness, guarding or rebound.  Musculoskeletal:        General: Normal range of motion.     Cervical back: Normal range of motion.     Right lower leg: No edema.     Left lower leg: No edema.   Lymphadenopathy:     Cervical: No cervical adenopathy.  Skin:    Capillary Refill: Capillary refill takes less than 2 seconds.  Neurological:     General: No focal deficit present.     Mental Status: She is alert and oriented to person, place, and time. Mental status is at baseline.     Motor: No weakness.  Psychiatric:        Mood and Affect: Mood normal.        Behavior: Behavior normal.        Thought Content: Thought content normal.        Judgment: Judgment normal.        07/25/2023    9:11 AM 07/17/2023    1:27 PM 08/03/2022   10:20 AM 02/22/2022    9:41 AM 11/10/2021   10:31 AM  Depression screen PHQ 2/9  Decreased Interest 0 0 0 0 0  Down, Depressed, Hopeless 0 0 0 0 0  PHQ - 2 Score 0 0 0 0 0  Altered sleeping 1 0     Tired, decreased energy 0  0     Change in appetite 0 0     Feeling bad or failure about yourself  0 0     Trouble concentrating 0 0     Moving slowly or fidgety/restless 0 0     Suicidal thoughts 0 0     PHQ-9 Score 1 0     Difficult doing work/chores Not difficult at all Not difficult at all         07/17/2023    1:27 PM 07/14/2020    1:05 PM 01/15/2020    9:02 AM  GAD 7 : Generalized Anxiety Score  Nervous, Anxious, on Edge 0 0 0  Control/stop worrying 0 0 0  Worry too much - different things 0 0 0  Trouble relaxing 0 0 0  Restless 0 0 0  Easily annoyed or irritable 0 0 0  Afraid - awful might happen 0 0 0  Total GAD 7 Score 0 0 0  Anxiety Difficulty Not difficult at all  Not difficult at all    ASSESSMENT/PLAN:  Ingrown right big toenail Assessment & Plan: No signs of infection Refer Podiatry  Orders: -     Ambulatory referral to Podiatry  Sleep disturbance Assessment & Plan: Chronic Controlled on Lunesta 2 mg as needed.  Recommend using sparingly     Hyperlipidemia, unspecified hyperlipidemia type Assessment & Plan: Chronic Continue Zocor 10 mg Will need to clarify dose Check fasting lipids     PDMP  reviewed  Return if symptoms worsen or fail to improve, for PCP.  Dana Allan, MD

## 2023-07-20 ENCOUNTER — Ambulatory Visit (INDEPENDENT_AMBULATORY_CARE_PROVIDER_SITE_OTHER): Payer: Medicare Other | Admitting: Podiatry

## 2023-07-20 DIAGNOSIS — L600 Ingrowing nail: Secondary | ICD-10-CM | POA: Diagnosis not present

## 2023-07-20 NOTE — Progress Notes (Signed)
Subjective:  Patient ID: Annette Lee, female    DOB: Dec 04, 1935,  MRN: [id number]  Chief Complaint  Patient presents with   Nail Problem    Nail discomfort     87 y.o. female presents with the above complaint.  Patient presents with right medial border ingrown nail.  Patient states that is causing her some discomfort.  She wanted get it evaluated she does not have wanted removed.  Denies any other acute complaints is doing much better today.  She wore some ill fitting shoes that may have led to the aggravation of the ingrown   Review of Systems: Negative except as noted in the HPI. Denies N/V/F/Ch.  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 (of note n[facility]c x 2 face and 1x neck)    COVID-19 04/28/2021   [place] mild sxs   Endometriosis    Environmental and seasonal allergies    GERD (gastroesophageal reflux disease)    Hyperlipidemia    Osteopenia 08/2011   T -1.8 in hip   Osteoporosis    Sleep disturbance    UTI (urinary tract infection)    Vaginal atrophy     Current Outpatient Medications:    cyanocobalamin (DODEX) 1000 MCG/ML injection, Inject 1 mL (1,000 mcg total) into the muscle every 30 (thirty) days., Disp: 12 mL, Rfl: 5   eszopiclone (LUNESTA) 2 MG TABS tablet, Take 1 tablet (2 mg total) by mouth at bedtime as needed for sleep. Take immediately before bedtime, Disp: 30 tablet, Rfl: 1   mometasone (ALLERGY NASAL SPRAY) 50 MCG/ACT nasal spray, Place 2 sprays into the nose daily as needed., Disp: , Rfl:    NEEDLE, DISP, 25 G 25G X 1-1/2" MISC, 1 Device by Does not apply route every 30 (thirty) days., Disp: 12 each, Rfl: 5   simvastatin (ZOCOR) 10 MG tablet, Take 1 tablet (10 mg total) by mouth daily., Disp: 90 tablet, Rfl: 3   Vitamin D, Ergocalciferol, (DRISDOL) 1.25 MG (50000 UNIT) CAPS capsule, TAKE 1 CAPSULE BY MOUTH EVERY 7 DAYS FOR 12 DOSES,  Disp: 12 capsule, Rfl: 0  Current Facility-Administered Medications:    denosumab (PROLIA) injection 60 mg, 60 mg, Subcutaneous, Q6 months, McLean-Scocuzza, Pasty Spillers, MD, 60 mg at 02/14/23 1547  Social History   Tobacco Use  Smoking Status Former   Current packs/day: 0.00   Types: Cigarettes   Quit date: 10/30/1966   Years since quitting: 56.7  Smokeless Tobacco Never  Tobacco Comments   as of 08/2017 quit smoking >15 years ago 1ppd x 35 years no FH lung cancer     Allergies  Allergen Reactions   Contrast Media [Iodinated Contrast Media] Anaphylaxis   Objective:  There were no vitals filed for this visit. There is no height or weight on file to calculate BMI. Constitutional Well developed. Well nourished.  Vascular Dorsalis pedis pulses palpable bilaterally. Posterior tibial pulses palpable bilaterally. Capillary refill normal to all digits.  No cyanosis or clubbing noted. Pedal hair growth normal.  Neurologic Normal speech. Oriented to person, place, and time. Epicritic sensation to light touch grossly present bilaterally.  Dermatologic Painful ingrowing nail at medial nail borders of the hallux nail right. No other open wounds. No skin lesions.  Orthopedic: Normal joint ROM without pain or crepitus bilaterally. No visible deformities. No bony tenderness.   Radiographs: None Assessment:  1. Ingrown toenail of right foot    Plan:  Patient was evaluated and treated and all questions answered.  Ingrown Nail, right -Clinically I discussed the etiology of ingrown.  I discussed shoe gear modification.  At this time slant back procedure was performed in standard technique.  This gave her some relief.  If it continues to bother her we will do an official ingrown nail removal for now this gave her good relief.  If any acute issues happen she will come see me  No follow-ups on file.

## 2023-07-25 ENCOUNTER — Ambulatory Visit (INDEPENDENT_AMBULATORY_CARE_PROVIDER_SITE_OTHER): Payer: Medicare Other | Admitting: *Deleted

## 2023-07-25 ENCOUNTER — Encounter: Payer: Self-pay | Admitting: Family Medicine

## 2023-07-25 VITALS — Ht 64.0 in | Wt 131.0 lb

## 2023-07-25 DIAGNOSIS — L600 Ingrowing nail: Secondary | ICD-10-CM | POA: Insufficient documentation

## 2023-07-25 DIAGNOSIS — Z0000 Encounter for general adult medical examination without abnormal findings: Secondary | ICD-10-CM

## 2023-07-25 HISTORY — DX: Ingrowing nail: L60.0

## 2023-07-25 NOTE — Assessment & Plan Note (Signed)
No signs of infection Refer Podiatry

## 2023-07-25 NOTE — Patient Instructions (Signed)
Annette Lee , Thank you for taking time to come for your Medicare Wellness Visit. I appreciate your ongoing commitment to your health goals. Please review the following plan we discussed and let me know if I can assist you in the future.   Referrals/Orders/Follow-Ups/Clinician Recommendations: None  This is a list of the screening recommended for you and due dates:  Health Maintenance  Topic Date Due   Flu Shot  05/25/2023   COVID-19 Vaccine (6 - 2023-24 season) 06/25/2023   Medicare Annual Wellness Visit  07/24/2024   DTaP/Tdap/Td vaccine (2 - Td or Tdap) 12/26/2027   Pneumonia Vaccine  Completed   DEXA scan (bone density measurement)  Completed   Zoster (Shingles) Vaccine  Completed   HPV Vaccine  Aged Out    Advanced directives: (Copy Requested) Please bring a copy of your health care power of attorney and living will to the office to be added to your chart at your convenience.  Next Medicare Annual Wellness Visit scheduled for next year: Yes 07/30/24 @ 9:00

## 2023-07-25 NOTE — Assessment & Plan Note (Signed)
Chronic Controlled on Lunesta 2 mg as needed.  Recommend using sparingly

## 2023-07-25 NOTE — Progress Notes (Signed)
Subjective:   Annette Lee is a 87 y.o. female who presents for Medicare Annual (Subsequent) preventive examination.  Visit Complete: Virtual  I connected with  [facility] on 07/25/23 by a audio enabled telemedicine application and verified that I am speaking with the correct person using two identifiers.  Patient Location: Home  Provider Location: Office/Clinic  I discussed the limitations of evaluation and management by telemedicine. The patient expressed understanding and agreed to proceed.  Patient Medicare AWV questionnaire was completed by the patient on 06/3023; I have confirmed that all information answered by patient is correct and no changes since this date. Because this visit was a virtual/telehealth visit, some criteria may be missing or patient reported. Any vitals not documented were not able to be obtained and vitals that have been documented are patient reported.     Cardiac Risk Factors include: advanced age (>57men, >61 women);dyslipidemia     Objective:    Today's Vitals   07/25/23 0903  Weight: 131 lb (59.4 kg)  Height: 5\' 4"  (1.626 m)   Body mass index is 22.49 kg/m.     07/25/2023    9:19 AM 03/17/2023    2:19 PM 07/20/2020    9:10 AM 07/18/2019    9:18 AM 07/16/2018    8:40 AM 07/10/2017    2:04 PM 06/29/2016   10:52 AM  Advanced Directives  Does Patient Have a Medical Advance Directive? Yes Yes Yes Yes Yes Yes Yes  Type of Estate agent of [place];Living will Healthcare Power of [employer] of [place];Living will Healthcare Power of [place];Living will Healthcare Power of [place];Living will Healthcare Power of [place];Living will Healthcare Power of [place];Living will  Does patient want to make changes to medical advance directive?  No - Patient declined No - Patient declined No - Patient declined No - Patient declined  No - Patient declined  Copy of Healthcare Power of Attorney in Chart? No - copy requested  No - copy requested No - copy requested No - copy requested No - copy requested Yes No - copy requested    Current Medications (verified) Outpatient Encounter Medications as of 07/25/2023  Medication Sig   cyanocobalamin (DODEX) 1000 MCG/ML injection Inject 1 mL (1,000 mcg total) into the muscle every 30 (thirty) days.   eszopiclone (LUNESTA) 2 MG TABS tablet Take 1 tablet (2 mg total) by mouth at bedtime as needed for sleep. Take immediately before bedtime   mometasone (ALLERGY NASAL SPRAY) 50 MCG/ACT nasal spray Place 2 sprays into the nose daily as needed.   NEEDLE, DISP, 25 G 25G X 1-1/2" MISC 1 Device by Does not apply route every 30 (thirty) days.   simvastatin (ZOCOR) 10 MG tablet Take 1 tablet (10 mg total) by mouth daily.   Vitamin D, Ergocalciferol, (DRISDOL) 1.25 MG (50000 UNIT) CAPS capsule TAKE 1 CAPSULE BY MOUTH EVERY 7 DAYS FOR 12 DOSES   Facility-Administered Encounter Medications as of 07/25/2023  Medication   denosumab (PROLIA) injection 60 mg    Allergies (verified) Contrast media [iodinated contrast media]   History: 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 (of note [facility] x 2 face and 1x neck)    COVID-19 04/28/2021   [place] mild sxs   Endometriosis    Environmental and seasonal allergies    GERD (gastroesophageal reflux disease)  Hyperlipidemia    Osteopenia 08/2011   T -1.8 in hip   Osteoporosis    Sleep disturbance    UTI (urinary tract infection)    Vaginal atrophy    Past Surgical History:  Procedure Laterality Date   ABDOMINAL HYSTERECTOMY     total    APPENDECTOMY     CATARACT EXTRACTION W/ INTRAOCULAR LENS  IMPLANT, BILATERAL     COLONOSCOPY WITH PROPOFOL N/A 01/22/2016   Procedure: COLONOSCOPY WITH PROPOFOL;  Surgeon: Elnita Maxwell, MD;  Location: [facility] ENDOSCOPY;  Service: Endoscopy;  Laterality: N/A;   ROTATOR  CUFF REPAIR Right ~2001   SHOULDER ARTHROSCOPY WITH SUBACROMIAL DECOMPRESSION AND BICEP TENDON REPAIR Left 11/08/2018   Procedure: SHOULDER ARTHROSCOPY WITH DEBRIDEMENT, DECOMPRESSION AND ROTATOR CUFF REPAIR;  Surgeon: Christena Flake, MD;  Location: [facility] ORS;  Service: Orthopedics;  Laterality: Left;   Family History  Problem Relation Age of Onset   Cancer Mother        CLL   Cancer Father        colon   Aneurysm Father    Cancer Brother        colon cancer   Heart disease Maternal Uncle    Diabetes Neg Hx    Breast cancer Neg Hx    Bladder Cancer Neg Hx    Kidney cancer Neg Hx    Social History   Socioeconomic History   Marital status: Married    Spouse name: Douglass   Number of children: 0   Years of education: Not on file   Highest education level: Not on file  Occupational History   Occupation: Biochemist, clinical    Comment: Retired  Tobacco Use   Smoking status: Former    Current packs/day: 0.00    Types: Cigarettes    Quit date: 10/30/1966    Years since quitting: 56.7   Smokeless tobacco: Never   Tobacco comments:    as of 08/2017 quit smoking >15 years ago 1ppd x 35 years no FH lung cancer   Vaping Use   Vaping status: Never Used  Substance and Sexual Activity   Alcohol use: Yes    Alcohol/week: 2.0 standard drinks of alcohol    Types: 2 Shots of liquor per week    Comment: drinks gin per week   Drug use: No   Sexual activity: Never  Other Topics Concern   Not on file  Social History Narrative   Has living will   Husband is health care POA.   Would accept resuscitation but no prolonged ventilation   Not sure about tube feeds   Moved her from [place]    Exercises 3x per week    No kids    Social Determinants of Health   Financial Resource Strain: Low Risk  (07/24/2023)   Overall Financial Resource Strain (CARDIA)    Difficulty of Paying Living Expenses: Not hard at all  Food Insecurity: No Food Insecurity (07/24/2023)   Hunger  Vital Sign    Worried About Running Out of Food in the Last Year: Never true    Ran Out of Food in the Last Year: Never true  Transportation Needs: No Transportation Needs (07/24/2023)   PRAPARE - Administrator, Civil Service (Medical): No    Lack of Transportation (Non-Medical): No  Physical Activity: Insufficiently Active (07/24/2023)   Exercise Vital Sign    Days of Exercise per Week: 3 days    Minutes of Exercise per Session: 40 min  Stress:  No Stress Concern Present (07/24/2023)   [employer] of Occupational Health - Occupational Stress Questionnaire    Feeling of Stress : Not at all  Social Connections: Socially Integrated (07/24/2023)   Social Connection and Isolation Panel [NHANES]    Frequency of Communication with Friends and Family: Once a week    Frequency of Social Gatherings with Friends and Family: Twice a week    Attends Religious Services: More than 4 times per year    Active Member of [employer] or Organizations: Yes    Attends Engineer, structural: More than 4 times per year    Marital Status: Married    Tobacco Counseling Counseling given: Not Answered Tobacco comments: as of 08/2017 quit smoking >15 years ago 1ppd x 35 years no FH lung cancer    Clinical Intake:  Pre-visit preparation completed: Yes  Pain : No/denies pain     BMI - recorded: 22.49 Nutritional Status: BMI of 19-24  Normal Nutritional Risks: None Diabetes: No  How often do you need to have someone help you when you read instructions, pamphlets, or other written materials from your doctor or pharmacy?: 1 - Never  Interpreter Needed?: No  Information entered by :: R. Sydna Brodowski LPN   Activities of Daily Living    07/24/2023    6:51 PM  In your present state of health, do you have any difficulty performing the following activities:  Hearing? 0  Vision? 0  Difficulty concentrating or making decisions? 0  Walking or climbing stairs? 0  Dressing or bathing? 0  Doing  errands, shopping? 0  Preparing Food and eating ? N  Using the Toilet? N  In the past six months, have you accidently leaked urine? Y  Do you have problems with loss of bowel control? N  Managing your Medications? N  Managing your Finances? N  Housekeeping or managing your Housekeeping? N    Patient Care Team: Dana Allan, MD as PCP - General (Family Medicine) Art Vassie Moselle, MD as Consulting Physician (General Practice)  Indicate any recent Medical Services you may have received from other than [facility] providers in the past year (date may be approximate).     Assessment:   This is a routine wellness examination for [place].  Hearing/Vision screen Hearing Screening - Comments:: Wears aids Vision Screening - Comments:: glasses   Goals Addressed             This Visit's Progress    Patient Stated       Wants to stay active and continue to enjoy life       Depression Screen    07/25/2023    9:11 AM 07/17/2023    1:27 PM 08/03/2022   10:20 AM 02/22/2022    9:41 AM 11/10/2021   10:31 AM 10/28/2020   11:37 AM 07/20/2020    9:08 AM  PHQ 2/9 Scores  PHQ - 2 Score 0 0 0 0 0 0 0  PHQ- 9 Score 1 0         Fall Risk    07/24/2023    6:51 PM 07/17/2023    1:27 PM 03/18/2023   11:47 AM 08/03/2022   10:20 AM 02/22/2022    9:41 AM  Fall Risk   Falls in the past year? 0 0 0 0 0  Number falls in past yr: 0 0 0 0 0  Injury with Fall? 0 0 0 0 0  Risk for fall due to : No Fall Risks No Fall Risks  No Fall Risks No Fall Risks  Follow up Falls prevention discussed;Falls evaluation completed Falls evaluation completed  Falls evaluation completed Falls evaluation completed    MEDICARE RISK AT HOME: Medicare Risk at Home Any stairs in or around the home?: Yes If so, are there any without handrails?: No Home free of loose throw rugs in walkways, pet beds, electrical cords, etc?: No Adequate lighting in your home to reduce risk of falls?: Yes Life alert?: No Use of a cane, walker or w/c?:  No Grab bars in the bathroom?: Yes Shower chair or bench in shower?: No Elevated toilet seat or a handicapped toilet?: No   Cognitive Function:    07/10/2017    2:02 PM 06/29/2016   11:15 AM  MMSE - Mini Mental State Exam  Orientation to time 5 5  Orientation to Place 5 5  Registration 3 3  Attention/ Calculation 0 0  Recall 3 3  Language- name 2 objects 0 0  Language- repeat 1 1  Language- follow 3 step command 3 3  Language- read & follow direction 0 0  Write a sentence 0 0  Copy design 0 0  Total score 20 20        07/25/2023    9:19 AM 07/18/2019    9:23 AM 07/16/2018    9:06 AM  6CIT Screen  What Year? 0 points 0 points 0 points  What month? 0 points 0 points 0 points  What time? 0 points 0 points 0 points  Count back from 20 0 points 0 points 0 points  Months in reverse 0 points 0 points 0 points  Repeat phrase 0 points 0 points 0 points  Total Score 0 points 0 points 0 points    Immunizations Immunization History  Administered Date(s) Administered   Fluad Quad(high Dose 65+) 06/24/2019, 07/14/2020, 07/30/2021   Influenza Split 08/02/2013   Influenza,inj,Quad PF,6+ Mos 07/08/2014, 07/13/2015, 06/29/2016, 07/10/2017, 07/16/2018   Moderna Covid-19 Fall Seasonal Vaccine 50yrs & older 01/31/2023   Moderna Covid-19 Vaccine Bivalent Booster 11yrs & up 09/02/2022   Moderna SARS-COV2 Booster Vaccination 09/08/2020, 03/09/2021   Moderna Sars-Covid-2 Vaccination 11/05/2019, 12/03/2019, 09/08/2020, 03/11/2021, 09/28/2021   PNEUMOCOCCAL CONJUGATE-20 08/04/2022   Pneumococcal Conjugate-13 09/10/2013, 09/11/2014   Pneumococcal Polysaccharide-23 09/10/2013   Rotavirus,unspecified  08/04/2022   Tdap 12/25/2017   Zoster Recombinant(Shingrix) 08/18/2022, 10/19/2022   Zoster, Live 09/11/2014    TDAP status: Up to date  Flu Vaccine status: Up to date per patient just recently had this  Pneumococcal vaccine status: Up to date  Covid-19 vaccine status: Information provided  on how to obtain vaccines.   Qualifies for Shingles Vaccine? Yes   Zostavax completed Yes   Shingrix Completed?: Yes  Screening Tests Health Maintenance  Topic Date Due   Medicare Annual Wellness (AWV)  07/20/2021   INFLUENZA VACCINE  05/25/2023   COVID-19 Vaccine (6 - 2023-24 season) 06/25/2023   DTaP/Tdap/Td (2 - Td or Tdap) 12/26/2027   Pneumonia Vaccine 71+ Years old  Completed   DEXA SCAN  Completed   Zoster Vaccines- Shingrix  Completed   HPV VACCINES  Aged Out    Health Maintenance  Health Maintenance Due  Topic Date Due   Medicare Annual Wellness (AWV)  07/20/2021   INFLUENZA VACCINE  05/25/2023   COVID-19 Vaccine (6 - 2023-24 season) 06/25/2023    Colorectal cancer screening: No longer required.   Mammogram status: No longer required due to age.  Bone Density status: Completed 01/2022. Results reflect: Bone density results:  OSTEOPOROSIS. Repeat every 2 years.  Lung Cancer Screening: (Low Dose CT Chest recommended if Age 80-80 years, 20 pack-year currently smoking OR have quit w/in 15years.) does not qualify.     Additional Screening:  Hepatitis C Screening: does not qualify; Completed NA age  Vision Screening: Recommended annual ophthalmology exams for early detection of glaucoma and other disorders of the eye. Is the patient up to date with their annual eye exam?  Yes  Who is the provider or what is the name of the office in which the patient attends annual eye exams? Dr. Alveria Apley If pt is not established with a provider, would they like to be referred to a provider to establish care? No .   Dental Screening: Recommended annual dental exams for proper oral hygiene   Community Resource Referral / Chronic Care Management: CRR required this visit?  No   CCM required this visit?  No     Plan:     I have personally reviewed and noted the following in the patient's chart:   Medical and social history Use of alcohol, tobacco or illicit drugs  Current  medications and supplements including opioid prescriptions. Patient is not currently taking opioid prescriptions. Functional ability and status Nutritional status Physical activity Advanced directives List of other physicians Hospitalizations, surgeries, and ER visits in previous 12 months Vitals Screenings to include cognitive, depression, and falls Referrals and appointments  In addition, I have reviewed and discussed with patient certain preventive protocols, quality metrics, and best practice recommendations. A written personalized care plan for preventive services as well as general preventive health recommendations were provided to patient.     Sydell Axon, LPN   25/06/5637   After Visit Summary: (MyChart) Due to this being a telephonic visit, the after visit summary with patients personalized plan was offered to patient via MyChart   Nurse Notes: None

## 2023-07-25 NOTE — Assessment & Plan Note (Signed)
Chronic Continue Zocor 10 mg Will need to clarify dose Check fasting lipids

## 2023-08-21 ENCOUNTER — Ambulatory Visit (INDEPENDENT_AMBULATORY_CARE_PROVIDER_SITE_OTHER): Payer: Medicare Other

## 2023-08-21 DIAGNOSIS — M810 Age-related osteoporosis without current pathological fracture: Secondary | ICD-10-CM | POA: Diagnosis not present

## 2023-08-21 MED ORDER — DENOSUMAB 60 MG/ML ~~LOC~~ SOSY
60.0000 mg | PREFILLED_SYRINGE | Freq: Once | SUBCUTANEOUS | Status: AC
Start: 2023-08-21 — End: 2023-08-21
  Administered 2023-08-21: 60 mg via SUBCUTANEOUS

## 2023-08-21 NOTE — Progress Notes (Signed)
Pt presented for their subcutaneous Prolia injection. Pt was identified through two identifiers. Pt was given the information packets about the Prolia and told to schedule their next injection 6 months out. Pt tolerated the subq injection well in the left arm.  

## 2023-08-27 ENCOUNTER — Ambulatory Visit: Payer: Medicare Other

## 2023-08-27 ENCOUNTER — Ambulatory Visit
Admission: EM | Admit: 2023-08-27 | Discharge: 2023-08-27 | Disposition: A | Discharge status: Home / Self Care | Payer: Medicare Other | Attending: Emergency Medicine | Admitting: Emergency Medicine

## 2023-08-27 DIAGNOSIS — Z23 Encounter for immunization: Secondary | ICD-10-CM

## 2023-08-27 DIAGNOSIS — S90211A Contusion of right great toe with damage to nail, initial encounter: Secondary | ICD-10-CM | POA: Diagnosis not present

## 2023-08-27 DIAGNOSIS — M79674 Pain in right toe(s): Secondary | ICD-10-CM | POA: Diagnosis not present

## 2023-08-27 MED ORDER — TETANUS-DIPHTH-ACELL PERTUSSIS 5-2.5-18.5 LF-MCG/0.5 IM SUSY
0.5000 mL | PREFILLED_SYRINGE | Freq: Once | INTRAMUSCULAR | Status: AC
  Administered 2023-08-27: 0.5 mL via INTRAMUSCULAR

## 2023-08-27 NOTE — Discharge Instructions (Addendum)
Your tetanus was updated today.    Wash your toe gently with soap and water twice a day.  Apply an antibiotic ointment and bandage.  Follow-up right away if you note signs of infection.

## 2023-08-27 NOTE — ED Triage Notes (Signed)
Patient to Urgent Care with complaints of a wound on her right foot. Bleeding around her toenail.  Reports that she dropped a heavy object on her foot yesterday and is concerned about the bleeding.  Using ice/ antibiotic ointment/ bandages/ resting.

## 2023-08-27 NOTE — ED Provider Notes (Signed)
Annette Lee    CSN: [id number] Arrival date & time: 08/27/23  1253      History   Chief Complaint Chief Complaint  Patient presents with   Foot Injury    HPI Annette Lee is a 87 y.o. female.  Patient presents with injury to her right great toe.  She accidentally dropped a shelf on it yesterday.  The corner of the shelf hit her toe.  She elevated it and applied ice packs.  It has continued to ooze a small amount of blood at the base of her toenail today.  She is not on anticoagulants.  She states the toe is not painful.  No numbness or weakness.  Last tetanus >5 years.   The history is provided by the patient and medical records.    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 (of note nmsc x 2 face and 1x neck)    COVID-19 04/28/2021   [place] mild sxs   Endometriosis    Environmental and seasonal allergies    GERD (gastroesophageal reflux disease)    Hyperlipidemia    Osteopenia 08/2011   T -1.8 in hip   Osteoporosis    Sleep disturbance    UTI (urinary tract infection)    Vaginal atrophy     Patient Active Problem List   Diagnosis Date Noted   Ingrown right big toenail 07/25/2023   Fracture Risk Assessment Score (FRAX) indicating greater than 3% risk for hip fracture 08/03/2022   Fracture Risk Assessment Score (FRAX) indicating greater than 20% risk for major osteoporosis-related fracture 08/03/2022   Thoracic arthritis 10/28/2020   Arthritis, 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   B12 deficiency 07/30/2020   Osteopenia 01/15/2020   Closed fracture of upper end of left fibula 11/04/2019   Allergic rhinitis 01/08/2019   H/O shoulder surgery 01/08/2019   Nontraumatic complete tear of left rotator cuff 09/24/2018   Rotator cuff tendinitis, left 09/24/2018   Impingement syndrome of left shoulder 04/10/2018   Acute cystitis with hematuria  03/02/2018   Skin lesion 03/02/2018   Skin cancer 01/15/2018   Lichen sclerosus 01/09/2018   Hematuria 01/09/2018   Osteoporosis 12/21/2017   Sleep disorder 12/24/2015   History of colonic polyps 12/17/2015   Hyperlipidemia    Allergic rhinitis due to pollen    Sleep disturbance     Past Surgical History:  Procedure Laterality Date   ABDOMINAL HYSTERECTOMY     total    APPENDECTOMY     CATARACT EXTRACTION W/ INTRAOCULAR LENS  IMPLANT, BILATERAL     COLONOSCOPY WITH PROPOFOL N/A 01/22/2016   Procedure: COLONOSCOPY WITH PROPOFOL;  Surgeon: Elnita Maxwell, MD;  Location: [facility] ENDOSCOPY;  Service: Endoscopy;  Laterality: N/A;   ROTATOR CUFF REPAIR Right ~2001   SHOULDER ARTHROSCOPY WITH SUBACROMIAL DECOMPRESSION AND BICEP TENDON REPAIR Left 11/08/2018   Procedure: SHOULDER ARTHROSCOPY WITH DEBRIDEMENT, DECOMPRESSION AND ROTATOR CUFF REPAIR;  Surgeon: Christena Flake, MD;  Location: [facility] ORS;  Service: Orthopedics;  Laterality: Left;    OB History   No obstetric history on file.      Home Medications    Prior to Admission medications   Medication Sig Start Date End Date Taking? Authorizing Provider  cyanocobalamin (DODEX) 1000 MCG/ML injection Inject 1 mL (1,000 mcg total) into the muscle every 30 (thirty) days. 06/22/23   Beamer,  Benetta Spar, MD  eszopiclone (LUNESTA) 2 MG TABS tablet Take 1 tablet (2 mg total) by mouth at bedtime as needed for sleep. Take immediately before bedtime 03/17/23   Earnestine Mealing, MD  mometasone (ALLERGY NASAL SPRAY) 50 MCG/ACT nasal spray Place 2 sprays into the nose daily as needed.    [provider]  NEEDLE, DISP, 25 G 25G X 1-1/2" MISC 1 Device by Does not apply route every 30 (thirty) days. 01/28/21   McLean-Scocuzza, Pasty Spillers, MD  simvastatin (ZOCOR) 10 MG tablet Take 1 tablet (10 mg total) by mouth daily. 06/01/23 05/31/24  Earnestine Mealing, MD  Vitamin D, Ergocalciferol, (DRISDOL) 1.25 MG (50000 UNIT) CAPS capsule TAKE 1 CAPSULE BY MOUTH  EVERY 7 DAYS FOR 12 DOSES 06/12/23   Earnestine Mealing, MD    Family History Family History  Problem Relation Age of Onset   Cancer Mother        CLL   Cancer Father        colon   Aneurysm Father    Cancer Brother        colon cancer   Heart disease Maternal Uncle    Diabetes Neg Hx    Breast cancer Neg Hx    Bladder Cancer Neg Hx    Kidney cancer Neg Hx     Social History Social History   Tobacco Use   Smoking status: Former    Current packs/day: 0.00    Types: Cigarettes    Quit date: 10/30/1966    Years since quitting: 56.8   Smokeless tobacco: Never   Tobacco comments:    as of 08/2017 quit smoking >15 years ago 1ppd x 35 years no FH lung cancer   Vaping Use   Vaping status: Never Used  Substance Use Topics   Alcohol use: Yes    Alcohol/week: 2.0 standard drinks of alcohol    Types: 2 Shots of liquor per week    Comment: drinks gin per week   Drug use: No     Allergies   Contrast media [iodinated contrast media]   Review of Systems Review of Systems  Constitutional:  Negative for chills and fever.  Musculoskeletal:  Positive for joint swelling. Negative for arthralgias and gait problem.  Skin:  Positive for color change and wound.  Neurological:  Negative for weakness and numbness.     Physical Exam Triage Vital Signs ED Triage Vitals  Encounter Vitals Group     BP      Systolic BP Percentile      Diastolic BP Percentile      Pulse      Resp      Temp      Temp src      SpO2      Weight      Height      Head Circumference      Peak Flow      Pain Score      Pain Loc      Pain Education      Exclude from Growth Chart    No data found.  Updated Vital Signs BP 112/78   Pulse 70   Temp 97.8 F (36.6 C)   Resp 18   SpO2 97%   Visual Acuity Right Eye Distance:   Left Eye Distance:   Bilateral Distance:    Right Eye Near:   Left Eye Near:    Bilateral Near:     Physical Exam Constitutional:      General:  She is not in acute  distress. HENT:     Mouth/Throat:     Mouth: Mucous membranes are moist.  Cardiovascular:     Rate and Rhythm: Normal rate and regular rhythm.  Pulmonary:     Effort: Pulmonary effort is normal. No respiratory distress.  Musculoskeletal:        General: Swelling present. No tenderness or deformity. Normal range of motion.     Comments: Right great toe: Approximately 50% subungual hematoma.  Scant blood from superficial abrasion at base of toenail.  Generalized ecchymosis of toe.  Nontender.  Sensation intact.  Skin:    General: Skin is warm and dry.     Capillary Refill: Capillary refill takes less than 2 seconds.     Findings: Bruising and lesion present.  Neurological:     General: No focal deficit present.     Mental Status: She is alert.     Sensory: No sensory deficit.     Motor: No weakness.     Gait: Gait normal.      UC Treatments / Results  Labs (all labs ordered are listed, but only abnormal results are displayed) Labs Reviewed - No data to display  EKG   Radiology No results found.  Procedures Procedures (including critical care time)  Medications Ordered in UC Medications  Tdap (BOOSTRIX) injection 0.5 mL (0.5 mLs Intramuscular Given 08/27/23 1516)    Initial Impression / Assessment and Plan / UC Course  I have reviewed the triage vital signs and the nursing notes.  Pertinent labs & imaging results that were available during my care of the patient were reviewed by me and considered in my medical decision making (see chart for details).   Subungual hematoma of right great toe, contusion of right great toe.  Patient declines xray.  She declines drainage of the subungual hematoma.  Tetanus updated today.  Wound cleaned and dressed with antibiotic ointment.  Wound care instructions and signs of infection discussed with patient.  Instructed her to follow-up right away if she notes signs of infection or her symptoms worsen.  Education provided on subungual  hematoma and foot contusion.  Patient agrees to plan of care.  Final Clinical Impressions(s) / UC Diagnoses   Final diagnoses:  Subungual hematoma of great toe of right foot, initial encounter  Contusion of right great toe with damage to nail, initial encounter     Discharge Instructions      Your tetanus was updated today.    Wash your toe gently with soap and water twice a day.  Apply an antibiotic ointment and bandage.  Follow-up right away if you note signs of infection.     ED Prescriptions   None    PDMP not reviewed this encounter.   Mickie Bail, NP 08/27/23 [phone]

## 2023-09-05 ENCOUNTER — Other Ambulatory Visit: Payer: Self-pay | Admitting: Student

## 2023-09-05 DIAGNOSIS — E559 Vitamin D deficiency, unspecified: Secondary | ICD-10-CM

## 2023-09-07 ENCOUNTER — Other Ambulatory Visit: Payer: Medicare Other | Admitting: Family Medicine

## 2023-09-07 DIAGNOSIS — E559 Vitamin D deficiency, unspecified: Secondary | ICD-10-CM

## 2023-09-08 ENCOUNTER — Other Ambulatory Visit: Payer: Self-pay

## 2023-09-08 DIAGNOSIS — E559 Vitamin D deficiency, unspecified: Secondary | ICD-10-CM

## 2023-09-08 NOTE — Addendum Note (Signed)
Addended by: Vertis Kelch on: 09/08/2023 04:02 PM   Modules accepted: Orders

## 2023-09-08 NOTE — Telephone Encounter (Signed)
Prescription Request  09/08/2023  LOV: 07/17/2023  What is the name of the medication or equipment? Vitamin D  Have you contacted your pharmacy to request a refill? Yes   Which pharmacy would you like this sent to?  [facility] [address]  Patient notified that their request is being sent to the clinical staff for review and that they should receive a response within 2 business days.   Please advise at Mobile [phone] (mobile)

## 2023-09-08 NOTE — Telephone Encounter (Signed)
RX request sent to provider.

## 2023-09-18 ENCOUNTER — Ambulatory Visit: Payer: Medicare Other | Admitting: Student

## 2023-11-06 ENCOUNTER — Ambulatory Visit: Payer: Medicare Other | Admitting: Family Medicine

## 2023-11-06 ENCOUNTER — Encounter: Payer: Self-pay | Admitting: Family Medicine

## 2023-11-06 VITALS — BP 122/62 | HR 80 | Temp 98.2°F | Resp 18 | Ht 63.5 in | Wt 130.5 lb

## 2023-11-06 DIAGNOSIS — E559 Vitamin D deficiency, unspecified: Secondary | ICD-10-CM

## 2023-11-06 DIAGNOSIS — L989 Disorder of the skin and subcutaneous tissue, unspecified: Secondary | ICD-10-CM

## 2023-11-06 DIAGNOSIS — Z0001 Encounter for general adult medical examination with abnormal findings: Secondary | ICD-10-CM | POA: Diagnosis not present

## 2023-11-06 DIAGNOSIS — Z780 Asymptomatic menopausal state: Secondary | ICD-10-CM

## 2023-11-06 DIAGNOSIS — Z1231 Encounter for screening mammogram for malignant neoplasm of breast: Secondary | ICD-10-CM

## 2023-11-06 DIAGNOSIS — J309 Allergic rhinitis, unspecified: Secondary | ICD-10-CM | POA: Diagnosis not present

## 2023-11-06 DIAGNOSIS — E538 Deficiency of other specified B group vitamins: Secondary | ICD-10-CM

## 2023-11-06 DIAGNOSIS — M810 Age-related osteoporosis without current pathological fracture: Secondary | ICD-10-CM

## 2023-11-06 DIAGNOSIS — E785 Hyperlipidemia, unspecified: Secondary | ICD-10-CM

## 2023-11-06 DIAGNOSIS — H6123 Impacted cerumen, bilateral: Secondary | ICD-10-CM

## 2023-11-06 DIAGNOSIS — Z0000 Encounter for general adult medical examination without abnormal findings: Secondary | ICD-10-CM

## 2023-11-06 MED ORDER — MOMETASONE FUROATE 50 MCG/ACT NA SUSP: 2.0000 | Freq: Every day | NASAL | 6 refills | Status: DC | PRN

## 2023-11-06 NOTE — Patient Instructions (Addendum)
 It was a pleasure meeting you today. Thank you for allowing me to take part in your health care.  Our goals for today as we discussed include:  Please schedule lab appointment.  Fast for 10 hours  Can use Debrox 5 drop two times a day to right ear.  If no improvement can schedule Nurse appointment for ear flushing.  Referral sent for Mammogram and Dexa scan. Please call to schedule appointment. Due January 23, 2024 [facility] [address] [place], [place] [address] [phone]    Follow up with Dermatology at [facility]   This is a list of the screening recommended for you and due dates:  Health Maintenance  Topic Date Due   COVID-19 Vaccine (7 - 2024-25 season) 09/07/2023   Medicare Annual Wellness Visit  07/24/2024   DTaP/Tdap/Td vaccine (3 - Td or Tdap) 08/26/2033   Pneumonia Vaccine  Completed   Flu Shot  Completed   DEXA scan (bone density measurement)  Completed   Zoster (Shingles) Vaccine  Completed   HPV Vaccine  Aged Out      If you have any questions or concerns, please do not hesitate to call the office at [phone].  I look forward to our next visit and until then take care and stay safe.  Regards,   Glenys Ferrari, MD   [facility]

## 2023-11-06 NOTE — Progress Notes (Signed)
 SUBJECTIVE:   Chief Complaint  Patient presents with   Annual Exam   HPI Presents for annual physical  Discussed the use of AI scribe software for clinical note transcription with the patient, who gave verbal consent to proceed.  History of Present Illness Annette Lee, a patient with a history of allergies, joint issues related to yeast, and a weak lower back, presents for a routine physical. The patient reports no significant changes in health since the last visit. She mentions a persistent issue with a weak lower back, which improves with regular walking.  The patient also reports a recent toe injury caused by dropping a heavy object, resulting in a blackened and sore toe. Despite seeing a podiatrist and using Absorbium Junior, the toe remains sore.  The patient has a history of yeast-related joint issues, which she manages through dietary control, specifically avoiding sugars and yeast. She notes an increase in joint discomfort when she consumes more sweets, as during the recent holiday season.  The patient also mentions a small, persistent skin bump, which has been previously evaluated by a dermatologist. She expresses concern about the bump, noting it is different from other skin features.  The patient reports occasional trouble with gas, slightly more than usual, but no other gastrointestinal issues. She also mentions a minor issue with ear wax build-up, which she feels is affecting her hearing.  The patient is generally active, walking five to six miles a week, and manages her own cooking. She reports no significant weight loss, but a slight weight gain, which she is monitoring. She also reports no issues with sleep, especially when eating her main meal earlier in the day.  The patient is on a regimen of Vitamin B12 injections, which she has been managing well. She also mentions a history of taking Lunesta  for sleep, but has not needed it recently. She is also on Simvastatin   (Zocor ) for cholesterol management.   Review of Systems - Negative except listed above    PERTINENT PMH / PSH: As above  OBJECTIVE:  BP 122/62   Pulse 80   Temp 98.2 F (36.8 C)   Resp 18   Ht 5' 3.5 (1.613 m)   Wt 130 lb 8 oz (59.2 kg)   SpO2 96%   BMI 22.75 kg/m    Physical Exam Vitals reviewed.  Constitutional:      General: She is not in acute distress.    Appearance: She is not ill-appearing.  HENT:     Head: Normocephalic.     Right Ear: Ear canal and external ear normal. There is impacted cerumen.     Left Ear: Tympanic membrane, ear canal and external ear normal.     Nose: Nose normal.     Mouth/Throat:     Mouth: Mucous membranes are moist.  Eyes:     Extraocular Movements: Extraocular movements intact.     Conjunctiva/sclera: Conjunctivae normal.     Pupils: Pupils are equal, round, and reactive to light.  Neck:     Thyroid : No thyromegaly or thyroid  tenderness.     Vascular: No carotid bruit.  Cardiovascular:     Rate and Rhythm: Normal rate and regular rhythm.     Pulses: Normal pulses.     Heart sounds: Normal heart sounds.  Pulmonary:     Effort: Pulmonary effort is normal.     Breath sounds: Normal breath sounds.  Abdominal:     General: Bowel sounds are normal. There is no distension.  Palpations: Abdomen is soft.     Tenderness: There is no abdominal tenderness. There is no right CVA tenderness, left CVA tenderness, guarding or rebound.  Musculoskeletal:        General: Normal range of motion.     Cervical back: Normal range of motion.     Right lower leg: No edema.     Left lower leg: No edema.  Lymphadenopathy:     Cervical: No cervical adenopathy.  Skin:    Capillary Refill: Capillary refill takes less than 2 seconds.  Neurological:     General: No focal deficit present.     Mental Status: She is alert and oriented to person, place, and time. Mental status is at baseline.     Motor: No weakness.  Psychiatric:        Mood and  Affect: Mood normal.        Behavior: Behavior normal.        Thought Content: Thought content normal.        Judgment: Judgment normal.        11/06/2023    1:08 PM 07/25/2023    9:11 AM 07/17/2023    1:27 PM 08/03/2022   10:20 AM 02/22/2022    9:41 AM  Depression screen PHQ 2/9  Decreased Interest 0 0 0 0 0  Down, Depressed, Hopeless 0 0 0 0 0  PHQ - 2 Score 0 0 0 0 0  Altered sleeping 0 1 0    Tired, decreased energy 0 0 0    Change in appetite 0 0 0    Feeling bad or failure about yourself  0 0 0    Trouble concentrating 0 0 0    Moving slowly or fidgety/restless 0 0 0    Suicidal thoughts 0 0 0    PHQ-9 Score 0 1 0    Difficult doing work/chores Not difficult at all Not difficult at all Not difficult at all        11/06/2023    1:08 PM 07/17/2023    1:27 PM 07/14/2020    1:05 PM 01/15/2020    9:02 AM  GAD 7 : Generalized Anxiety Score  Nervous, Anxious, on Edge 0 0 0 0  Control/stop worrying 0 0 0 0  Worry too much - different things 0 0 0 0  Trouble relaxing 0 0 0 0  Restless 0 0 0 0  Easily annoyed or irritable 0 0 0 0  Afraid - awful might happen 0 0 0 0  Total GAD 7 Score 0 0 0 0  Anxiety Difficulty Not difficult at all Not difficult at all  Not difficult at all    ASSESSMENT/PLAN:  Annual physical exam Assessment & Plan: Mammogram up to date, continue annually, due May 2025.  Order has been placed. Recommend regular self breast exams Annual labs ordered Vaccines up to date   Allergic rhinitis, unspecified seasonality, unspecified trigger Assessment & Plan: Refill nasal spray  Orders: -     Mometasone  Furoate; Place 2 sprays into the nose daily as needed.  Dispense: 1 each; Refill: 6  Hyperlipidemia, unspecified hyperlipidemia type -     Lipid panel; Future -     Comprehensive metabolic panel; Future  B12 deficiency Assessment & Plan: Patient has been taking Vitamin B12 injections -Check Vitamin B12 level  Orders: -     Vitamin B12;  Future  Vitamin D  deficiency Assessment & Plan: Completed high dose Vitamin D  supplements.  -Recheck levels  Orders: -  VITAMIN D  25 Hydroxy (Vit-D Deficiency, Fractures); Future  Osteoporosis, unspecified osteoporosis type, unspecified pathological fracture presence -     DG Bone Density; Future  Postmenopausal estrogen deficiency -     DG Bone Density; Future  Breast cancer screening by mammogram -     3D Screening Mammogram, Left and Right; Future  Skin lesion Assessment & Plan: Patient has a small lesion on her skin that has been present for a while. She has seen a dermatologist in the past. -Advise patient to follow up with dermatologist for evaluation of skin lesion.   Bilateral impacted cerumen Assessment & Plan: Patient reports feeling like she's underwater due to ear wax buildup. -Recommend over-the-counter Debrox, 5 drops twice a day for 5 days.     Follow-up in 6 months.   PDMP reviewed  Return in about 6 months (around 05/05/2024) for PCP.  Glenys Ferrari, MD

## 2023-11-07 ENCOUNTER — Telehealth: Payer: Self-pay

## 2023-11-07 ENCOUNTER — Other Ambulatory Visit (INDEPENDENT_AMBULATORY_CARE_PROVIDER_SITE_OTHER): Payer: Medicare Other

## 2023-11-07 DIAGNOSIS — E785 Hyperlipidemia, unspecified: Secondary | ICD-10-CM

## 2023-11-07 DIAGNOSIS — E538 Deficiency of other specified B group vitamins: Secondary | ICD-10-CM | POA: Diagnosis not present

## 2023-11-07 DIAGNOSIS — E559 Vitamin D deficiency, unspecified: Secondary | ICD-10-CM | POA: Diagnosis not present

## 2023-11-07 LAB — COMPREHENSIVE METABOLIC PANEL
ALT: 11 U/L (ref 0–35)
AST: 18 U/L (ref 0–37)
Albumin: 4.4 g/dL (ref 3.5–5.2)
Alkaline Phosphatase: 41 U/L (ref 39–117)
BUN: 18 mg/dL (ref 6–23)
CO2: 28 meq/L (ref 19–32)
Calcium: 9.7 mg/dL (ref 8.4–10.5)
Chloride: 106 meq/L (ref 96–112)
Creatinine, Ser: 0.77 mg/dL (ref 0.40–1.20)
GFR: 70.35 mL/min (ref >60.00)
Glucose, Bld: 100 mg/dL — ABNORMAL HIGH (ref 70–99)
Potassium: 3.9 meq/L (ref 3.5–5.1)
Sodium: 141 meq/L (ref 135–145)
Total Bilirubin: 0.6 mg/dL (ref 0.2–1.2)
Total Protein: 7.1 g/dL (ref 6.0–8.3)

## 2023-11-07 LAB — LIPID PANEL
Cholesterol: 181 mg/dL (ref 0–200)
HDL: 60.5 mg/dL (ref >39.00)
LDL Cholesterol: 109 mg/dL — ABNORMAL HIGH (ref 0–99)
NonHDL: 120.57
Total CHOL/HDL Ratio: 3
Triglycerides: 60 mg/dL (ref 0.0–149.0)
VLDL: 12 mg/dL (ref 0.0–40.0)

## 2023-11-07 LAB — VITAMIN B12: Vitamin B-12: 565 pg/mL (ref 211–911)

## 2023-11-07 LAB — VITAMIN D 25 HYDROXY (VIT D DEFICIENCY, FRACTURES): VITD: 42.35 ng/mL (ref 30.00–100.00)

## 2023-11-07 NOTE — Telephone Encounter (Signed)
 Copied from CRM [id number]. Topic: General - Other >> Nov 07, 2023 10:38 AM Rosina BIRCH wrote: Reason for CRM:Pt called stating there is a list of screenings recommended on her after visit paper and there is an item on the list that the pt need clarification on

## 2023-11-07 NOTE — Telephone Encounter (Signed)
 Called pt and spoke to her about the health maintenance part of her AVS. She thought she was due for a Tdap I did advise her that it is every 10 years and she is not due until 2034.

## 2023-11-13 DIAGNOSIS — Z780 Asymptomatic menopausal state: Secondary | ICD-10-CM

## 2023-11-13 DIAGNOSIS — Z1231 Encounter for screening mammogram for malignant neoplasm of breast: Secondary | ICD-10-CM | POA: Insufficient documentation

## 2023-11-13 DIAGNOSIS — E559 Vitamin D deficiency, unspecified: Secondary | ICD-10-CM | POA: Insufficient documentation

## 2023-11-13 DIAGNOSIS — Z0000 Encounter for general adult medical examination without abnormal findings: Secondary | ICD-10-CM | POA: Insufficient documentation

## 2023-11-13 HISTORY — DX: Asymptomatic menopausal state: Z78.0

## 2023-11-13 NOTE — Assessment & Plan Note (Addendum)
Patient has been taking Vitamin B12 injections -Check Vitamin B12 level

## 2023-11-14 ENCOUNTER — Encounter: Payer: Self-pay | Admitting: Family Medicine

## 2023-11-14 DIAGNOSIS — H6120 Impacted cerumen, unspecified ear: Secondary | ICD-10-CM

## 2023-11-14 HISTORY — DX: Impacted cerumen, unspecified ear: H61.20

## 2023-11-14 NOTE — Assessment & Plan Note (Signed)
Patient has a small lesion on her skin that has been present for a while. She has seen a dermatologist in the past. -Advise patient to follow up with dermatologist for evaluation of skin lesion.

## 2023-11-14 NOTE — Assessment & Plan Note (Signed)
Patient reports feeling like she's "underwater" due to ear wax buildup. -Recommend over-the-counter Debrox, 5 drops twice a day for 5 days.

## 2023-11-14 NOTE — Assessment & Plan Note (Deleted)
Patient reports feeling like she's "underwater" due to ear wax buildup. -Recommend over-the-counter Debrox, 5 drops twice a day for 5 days.

## 2023-11-14 NOTE — Assessment & Plan Note (Signed)
Mammogram up to date, continue annually, due May 2025.  Order has been placed. Recommend regular self breast exams Annual labs ordered Vaccines up to date

## 2023-11-14 NOTE — Assessment & Plan Note (Signed)
Refill nasal spray

## 2023-11-14 NOTE — Assessment & Plan Note (Signed)
Completed high dose Vitamin D supplements.  -Recheck levels

## 2023-12-04 ENCOUNTER — Ambulatory Visit: Payer: Self-pay | Admitting: Family Medicine

## 2023-12-04 NOTE — Telephone Encounter (Signed)
 Chief Complaint: numbness in legs Symptoms: no sensation  Frequency: once  Disposition: [] ED /[] Urgent Care (no appt availability in office) / [x] Appointment(In office/virtual)/ []  [facility]/ [] Home Care/ [] Refused Recommended Disposition /[]  Mobile Bus/ []  Follow-up with PCP Additional Notes:  Pt got up in middle of night Friday to go to restroom. Pt got out of bed feeling normal but on walk, her lower body went numb. Pt managed to make it bathroom without falling. Pt used bathroom and feeling came back as she sat. Pt feels this entire situation lasted under 5 mins. Pt denies any weakness, drooping, slurred speech during that time. Pt said it had only  happened once since. Pt currently has  full range of motion and denies any weakness numbness now. Per protocol, pt to be seen with 24 hours. Pt is seeing PCP 2/11 @11 . RN gave care advice and pt verbalized understanding.          Copied from CRM [id number]. Topic: Clinical - Red Word Triage >> Dec 04, 2023  8:57 AM Aline Ireland wrote: Red Word that prompted transfer to Nurse Triage: Pt stated that she woke up to go to the bathroom and the lower half of her bottom lost feeling this past Saturday. Reason for Disposition  [1] Numbness or tingling on both sides of body AND [2] is a new symptom present > 24 hours  Answer Assessment - Initial Assessment Questions 1. SYMPTOM: "What is the main symptom you are concerned about?" (e.g., weakness, numbness)     Numbness in both legs 2. ONSET: "When did this start?" (minutes, hours, days; while sleeping)     Friday  night- under 5 mins  3. LAST NORMAL: "When was the last time you (the patient) were normal (no symptoms)?"     Been normal  4. PATTERN "Does this come and go, or has it been constant since it started?"  "Is it present now?"     Only happened once  5. CARDIAC SYMPTOMS: "Have you had any of the following symptoms: chest pain, difficulty breathing, palpitations?"      Denies  6. NEUROLOGIC SYMPTOMS: "Have you had any of the following symptoms: headache, dizziness, vision loss, double vision, changes in speech, unsteady on your feet?"     Denies  7. OTHER SYMPTOMS: "Do you have any other symptoms?"     Denies  Protocols used: Neurologic Deficit-A-AH

## 2023-12-04 NOTE — Telephone Encounter (Signed)
Pt is scheduled to see you tomorrow

## 2023-12-05 ENCOUNTER — Ambulatory Visit: Payer: Medicare Other

## 2023-12-05 ENCOUNTER — Ambulatory Visit (INDEPENDENT_AMBULATORY_CARE_PROVIDER_SITE_OTHER): Payer: Medicare Other | Admitting: Family Medicine

## 2023-12-05 ENCOUNTER — Encounter: Payer: Self-pay | Admitting: Family Medicine

## 2023-12-05 VITALS — BP 122/68 | HR 63 | Temp 98.3°F | Resp 18 | Ht 63.5 in | Wt 134.2 lb

## 2023-12-05 DIAGNOSIS — R209 Unspecified disturbances of skin sensation: Secondary | ICD-10-CM

## 2023-12-05 DIAGNOSIS — E559 Vitamin D deficiency, unspecified: Secondary | ICD-10-CM

## 2023-12-05 DIAGNOSIS — M8580 Other specified disorders of bone density and structure, unspecified site: Secondary | ICD-10-CM

## 2023-12-05 LAB — TSH: TSH: 2.12 u[IU]/mL (ref 0.35–5.50)

## 2023-12-05 LAB — CBC WITH DIFFERENTIAL/PLATELET
Basophils Absolute: 0.1 10*3/uL (ref 0.0–0.1)
Basophils Relative: 0.8 % (ref 0.0–3.0)
Eosinophils Absolute: 0.2 10*3/uL (ref 0.0–0.7)
Eosinophils Relative: 2.7 % (ref 0.0–5.0)
HCT: 44.4 % (ref 36.0–46.0)
Hemoglobin: 14.7 g/dL (ref 12.0–15.0)
Lymphocytes Relative: 22.2 % (ref 12.0–46.0)
Lymphs Abs: 1.6 10*3/uL (ref 0.7–4.0)
MCHC: 33.2 g/dL (ref 30.0–36.0)
MCV: 93.6 fL (ref 78.0–100.0)
Monocytes Absolute: 0.6 10*3/uL (ref 0.1–1.0)
Monocytes Relative: 7.7 % (ref 3.0–12.0)
Neutro Abs: 4.8 10*3/uL (ref 1.4–7.7)
Neutrophils Relative %: 66.6 % (ref 43.0–77.0)
Platelets: 187 10*3/uL (ref 150.0–400.0)
RBC: 4.74 Mil/uL (ref 3.87–5.11)
RDW: 13.3 % (ref 11.5–15.5)
WBC: 7.2 10*3/uL (ref 4.0–10.5)

## 2023-12-05 LAB — CK: Total CK: 82 U/L (ref 7–177)

## 2023-12-05 MED ORDER — VITAMIN D3 125 MCG (5000 UT) PO CAPS: 5000.0000 [IU] | ORAL_CAPSULE | Freq: Every day | ORAL | 3 refills | Status: DC

## 2023-12-05 NOTE — Patient Instructions (Addendum)
It was a pleasure meeting you today. Thank you for allowing me to take part in your health care.  Our goals for today as we discussed include:  We will get some labs today.  If they are abnormal or we need to do something about them, I will call you.  If they are normal, I will send you a message on MyChart (if it is active) or a letter in the mail.  If you don't hear from [place] in 2 weeks, please call the office at the number below.   Back xray.  Refill sent for Vitamin D take 1 tablet daily.  Follow up as scheduled or if symptoms worsen   This is a list of the screening recommended for you and due dates:  Health Maintenance  Topic Date Due   COVID-19 Vaccine (7 - 2024-25 season) 09/07/2023   Medicare Annual Wellness Visit  07/24/2024   DTaP/Tdap/Td vaccine (3 - Td or Tdap) 08/26/2033   Pneumonia Vaccine  Completed   Flu Shot  Completed   DEXA scan (bone density measurement)  Completed   Zoster (Shingles) Vaccine  Completed   HPV Vaccine  Aged Out      If you have any questions or concerns, please do not hesitate to call the office at [phone].  I look forward to our next visit and until then take care and stay safe.  Regards,   Dana Allan, MD   [facility]

## 2023-12-05 NOTE — Assessment & Plan Note (Signed)
Brief episode of inability to feel lower extremities while ambulating at night, with no associated incontinence, weakness, or other neurological symptoms. Episode resolved spontaneously within a minute. No recurrence. Neuro exam benign today. -Order lumbar spine X-ray to evaluate for any structural abnormalities. -Consider MRI pending X-ray results. -Check labs today

## 2023-12-05 NOTE — Progress Notes (Signed)
SUBJECTIVE:   Chief Complaint  Patient presents with   Numbness    In both legs on Friday night  while walking to the bathroom, but stated that it has not happened again. Pt stated that it only lasted a 30 sec   HPI Presents for acute visit  Discussed the use of AI scribe software for clinical note transcription with the patient, who gave verbal consent to proceed.  History of Present Illness Annette Lee is an 88 year old female who presents with a transient episode of lower body numbness.  She experienced a sudden episode of numbness from the waist down while walking to the bathroom at night. There was no sensation in her lower body, but she was able to continue moving to reach the toilet and sit down. The episode lasted less than a minute, and she has not experienced any similar symptoms since. No incontinence of bowel or bladder occurred during the episode. No numbness, tingling, or weakness in her arms or legs, as well as headaches, visual changes, or loss of consciousness. The episode occurred in the dark, but she was able to see where she was going due to outside lighting.  She has a history of osteopenia and osteoporosis, particularly on the left side, and is currently out of vitamin D supplements. Her recent blood work, including vitamin B and D levels, was normal, and she is on cholesterol medication.  She maintains an active lifestyle, living at [facility] and walking regularly. She typically walks two miles twice a week and aims for three miles once a week. She maintains a healthy diet and reports no issues with constipation or bowel movements.  She has allergies affecting her sinuses and has an upcoming appointment with an allergist for further evaluation and testing.      PERTINENT PMH / PSH: As above  OBJECTIVE:  BP 122/68   Pulse 63   Temp 98.3 F (36.8 C)   Resp 18   Ht 5' 3.5" (1.613 m)   Wt 134 lb 4 oz (60.9 kg)   SpO2 97%   BMI 23.41 kg/m    Physical  Exam Vitals reviewed.  Constitutional:      General: She is not in acute distress.    Appearance: Normal appearance. She is normal weight. She is not ill-appearing, toxic-appearing or diaphoretic.  Eyes:     General:        Right eye: No discharge.        Left eye: No discharge.     Conjunctiva/sclera: Conjunctivae normal.  Cardiovascular:     Rate and Rhythm: Normal rate and regular rhythm.     Heart sounds: Normal heart sounds.  Pulmonary:     Effort: Pulmonary effort is normal.     Breath sounds: Normal breath sounds.  Abdominal:     General: Bowel sounds are normal.  Musculoskeletal:        General: Normal range of motion.  Skin:    General: Skin is warm and dry.  Neurological:     General: No focal deficit present.     Mental Status: She is alert and oriented to person, place, and time. Mental status is at baseline.     Sensory: No sensory deficit.     Motor: No weakness.     Coordination: Coordination normal.     Gait: Gait normal.     Deep Tendon Reflexes: Reflexes normal.  Psychiatric:        Mood and Affect: Mood normal.  Behavior: Behavior normal.        Thought Content: Thought content normal.        Judgment: Judgment normal.           12/05/2023    1:09 PM 11/06/2023    1:08 PM 07/25/2023    9:11 AM 07/17/2023    1:27 PM 08/03/2022   10:20 AM  Depression screen PHQ 2/9  Decreased Interest 0 0 0 0 0  Down, Depressed, Hopeless 0 0 0 0 0  PHQ - 2 Score 0 0 0 0 0  Altered sleeping 0 0 1 0   Tired, decreased energy 0 0 0 0   Change in appetite 0 0 0 0   Feeling bad or failure about yourself  0 0 0 0   Trouble concentrating 0 0 0 0   Moving slowly or fidgety/restless 0 0 0 0   Suicidal thoughts 0 0 0 0   PHQ-9 Score 0 0 1 0   Difficult doing work/chores Not difficult at all Not difficult at all Not difficult at all Not difficult at all       12/05/2023    1:10 PM 11/06/2023    1:08 PM 07/17/2023    1:27 PM 07/14/2020    1:05 PM  GAD 7 :  Generalized Anxiety Score  Nervous, Anxious, on Edge 0 0 0 0  Control/stop worrying 0 0 0 0  Worry too much - different things 0 0 0 0  Trouble relaxing 0 0 0 0  Restless 0 0 0 0  Easily annoyed or irritable 0 0 0 0  Afraid - awful might happen 0 0 0 0  Total GAD 7 Score 0 0 0 0  Anxiety Difficulty Not difficult at all Not difficult at all Not difficult at all     ASSESSMENT/PLAN:  Loss of sensation of saddle area and both lower extremities Assessment & Plan: Brief episode of inability to feel lower extremities while ambulating at night, with no associated incontinence, weakness, or other neurological symptoms. Episode resolved spontaneously within a minute. No recurrence. Neuro exam benign today. -Order lumbar spine X-ray to evaluate for any structural abnormalities. -Consider MRI pending X-ray results. -Check labs today  Orders: -     TSH -     DG Lumbar Spine Complete; Future -     CBC with Differential/Platelet -     CK  Vitamin D deficiency -     Vitamin D3; Take 1 capsule (5,000 Units total) by mouth daily.  Dispense: 90 capsule; Refill: 3  Osteopenia, unspecified location Assessment & Plan: Known history with recent report of back pain. -Continue Vitamin D 5000iu daily and Calcium supplementation. -Order blood work to check Vitamin D levels and other relevant markers.    PDMP reviewed  Return in about 8 days (around 12/13/2023).  Dana Allan, MD

## 2023-12-05 NOTE — Assessment & Plan Note (Addendum)
Known history with recent report of back pain. -Continue Vitamin D 5000iu daily and Calcium supplementation. -Order blood work to check Vitamin D levels and other relevant markers.

## 2023-12-13 ENCOUNTER — Telehealth: Payer: Medicare Other | Admitting: Family Medicine

## 2023-12-13 ENCOUNTER — Encounter: Payer: Self-pay | Admitting: Family Medicine

## 2023-12-13 VITALS — Ht 63.0 in | Wt 134.2 lb

## 2023-12-13 DIAGNOSIS — E538 Deficiency of other specified B group vitamins: Secondary | ICD-10-CM | POA: Diagnosis not present

## 2023-12-13 DIAGNOSIS — E559 Vitamin D deficiency, unspecified: Secondary | ICD-10-CM | POA: Diagnosis not present

## 2023-12-13 DIAGNOSIS — J309 Allergic rhinitis, unspecified: Secondary | ICD-10-CM

## 2023-12-13 DIAGNOSIS — R209 Unspecified disturbances of skin sensation: Secondary | ICD-10-CM

## 2023-12-13 MED ORDER — VITAMIN D 25 MCG (1000 UNIT) PO TABS: 2000.0000 [IU] | ORAL_TABLET | Freq: Every day | ORAL | 3 refills | Status: AC

## 2023-12-13 MED ORDER — CYANOCOBALAMIN 1000 MCG/ML IJ SOLN: 1000.0000 ug | INTRAMUSCULAR | 11 refills | Status: AC

## 2023-12-13 MED ORDER — AZELASTINE HCL 0.1 % NA SOLN: 2.0000 | Freq: Two times a day (BID) | NASAL | 12 refills | Status: AC

## 2023-12-13 NOTE — Patient Instructions (Addendum)
 It was a pleasure meeting you today. Thank you for allowing me to take part in your health care.  Our goals for today as we discussed include:  Refill sent for requested medication  Start Astelin nasal spray as directed    This is a list of the screening recommended for you and due dates:  Health Maintenance  Topic Date Due   COVID-19 Vaccine (7 - 2024-25 season) 09/07/2023   Medicare Annual Wellness Visit  07/24/2024   DTaP/Tdap/Td vaccine (3 - Td or Tdap) 08/26/2033   Pneumonia Vaccine  Completed   Flu Shot  Completed   DEXA scan (bone density measurement)  Completed   Zoster (Shingles) Vaccine  Completed   HPV Vaccine  Aged Out    Follow up as needed  If you have any questions or concerns, please do not hesitate to call the office at [phone].  I look forward to our next visit and until then take care and stay safe.  Regards,   Dana Allan, MD   [facility]

## 2023-12-13 NOTE — Progress Notes (Signed)
 Virtual Visit via Video note  I connected with Annette Lee on 12/20/23 at [phone] by video and verified that I am speaking with the correct person using two identifiers. Annette Lee is currently located at home and  is currently alone her during visit. The provider, Dana Allan, MD is located in their home at time of visit.  I discussed the limitations, risks, security and privacy concerns of performing an evaluation and management service by video and the availability of in person appointments. I also discussed with the patient that there may be a patient responsible charge related to this service. The patient expressed understanding and agreed to proceed.  Subjective: PCP: Dana Allan, MD  Chief Complaint  Patient presents with   Medical Management of Chronic Issues    Lab results     HPI Discussed the use of AI scribe software for clinical note transcription with the patient, who gave verbal consent to proceed.  History of Present Illness Annette Lee is an 88 year old female who presents for a follow-up regarding vitamin B12 and vitamin D management.  She administers a monthly injection of 1000 micrograms of vitamin B12, with the last injection on November 30, 2023. She had previously missed marking her calendar for the injections but has since corrected this oversight.  Her vitamin D level was recently checked and found to be normal at 42. She is taking vitamin D3 supplements, currently 1000 IU capsules, and plans to take two capsules daily to meet the recommended 2000 IU per day. She adjusted her intake after purchasing a new bottle of vitamin D.  Her allergies have worsened recently, coinciding with the arrival of robins, which she associates with her symptoms. She uses a nasal spray for relief, which provides some help.  She has not experienced any loss of sensation in her legs since the last visit. She did have an incident where she wore a girdle overnight, which she  speculated might have affected her circulation, but she has not had similar symptoms since.  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   ROS: Per HPI  Current Outpatient Medications:    azelastine (ASTELIN) 0.1 % nasal spray, Place 2 sprays into both nostrils 2 (two) times daily. Use in each nostril as directed, Disp: 30 mL, Rfl: 12   cholecalciferol (VITAMIN D3) 25 MCG (1000 UNIT) tablet, Take 2 tablets (2,000 Units total) by mouth daily., Disp: 180 tablet, Rfl: 3   mometasone (ALLERGY NASAL SPRAY) 50 MCG/ACT nasal spray, Place 2 sprays into the nose daily as needed., Disp: 1 each, Rfl: 6   simvastatin (ZOCOR) 10 MG tablet, Take 1 tablet (10 mg total) by mouth daily., Disp: 90 tablet, Rfl: 3   cyanocobalamin (VITAMIN B12) 1000 MCG/ML injection, Inject 1 mL (1,000 mcg total) into the muscle every 30 (thirty) days., Disp: 1 mL, Rfl: 11  Current Facility-Administered Medications:    denosumab (PROLIA) injection 60 mg, 60 mg, Subcutaneous, Q6 months, McLean-Scocuzza, Pasty Spillers, MD, 60 mg at 02/14/23 1547  Observations/Objective:  Today's Vitals   12/13/23 0814  Weight: 134 lb 3.2 oz (60.9 kg)  Height: 5\' 3"  (1.6 m)   Body mass index is 23.77 kg/m.   Physical Exam Pulmonary:     Effort: Pulmonary effort is normal.  Neurological:     Mental Status: She is alert and oriented to person, place, and time. Mental status is at baseline.  Psychiatric:        Mood  and Affect: Mood normal.        Behavior: Behavior normal.        Thought Content: Thought content normal.        Judgment: Judgment normal.    Assessment and Plan: B12 deficiency Assessment & Plan: Patient has been self-administering Vitamin B12 injections monthly, but there was confusion about the frequency. Last injection was on February 6th. Blood work from January showed normal Vitamin B12 levels. -Refill Vitamin B12 1000 mcg injections monthly.   Orders: -     Cyanocobalamin; Inject 1 mL (1,000  mcg total) into the muscle every 30 (thirty) days.  Dispense: 1 mL; Refill: 11  Vitamin D deficiency Assessment & Plan: Patient has been taking Vitamin D supplements. Blood work from January showed normal Vitamin D levels. -Continue Vitamin D supplements at 2000 units daily.   Orders: -     Vitamin D; Take 2 tablets (2,000 Units total) by mouth daily.  Dispense: 180 tablet; Refill: 3  Allergic rhinitis, unspecified seasonality, unspecified trigger Assessment & Plan: Patient has been using a nasal spray for allergies, but symptoms persist. -Add Astelin nasal spray, two sprays twice a day, in addition to current nasal spray.  Orders: -     Azelastine HCl; Place 2 sprays into both nostrils 2 (two) times daily. Use in each nostril as directed  Dispense: 30 mL; Refill: 12  Loss of sensation of saddle area and both lower extremities Assessment & Plan: Resolved.  Likely from compression of wearing waist girdle overnight Recent lower L Spine xray showing DJD and facet disease more pronounced at L4-L5 -Continue symptomatic treatment.  -Avoid compression at night     Follow Up Instructions: Return if symptoms worsen or fail to improve, for PCP.   I discussed the assessment and treatment plan with the patient. The patient was provided an opportunity to ask questions and all were answered. The patient agreed with the plan and demonstrated an understanding of the instructions.   The patient was advised to call back or seek an in-person evaluation if the symptoms worsen or if the condition fails to improve as anticipated.  The above assessment and management plan was discussed with the patient. The patient verbalized understanding of and has agreed to the management plan. Patient is aware to call the clinic if symptoms persist or worsen. Patient is aware when to return to the clinic for a follow-up visit. Patient educated on when it is appropriate to go to the emergency department.    Dana Allan, MD

## 2023-12-20 ENCOUNTER — Encounter: Payer: Self-pay | Admitting: Family Medicine

## 2023-12-20 NOTE — Assessment & Plan Note (Signed)
 Patient has been self-administering Vitamin B12 injections monthly, but there was confusion about the frequency. Last injection was on February 6th. Blood work from January showed normal Vitamin B12 levels. -Refill Vitamin B12 1000 mcg injections monthly.

## 2023-12-20 NOTE — Assessment & Plan Note (Signed)
 Resolved.  Likely from compression of wearing waist girdle overnight Recent lower L Spine xray showing DJD and facet disease more pronounced at L4-L5 -Continue symptomatic treatment.  -Avoid compression at night

## 2023-12-20 NOTE — Assessment & Plan Note (Signed)
 Patient has been using a nasal spray for allergies, but symptoms persist. -Add Astelin nasal spray, two sprays twice a day, in addition to current nasal spray.

## 2023-12-20 NOTE — Assessment & Plan Note (Signed)
 Patient has been taking Vitamin D supplements. Blood work from January showed normal Vitamin D levels. -Continue Vitamin D supplements at 2000 units daily.

## 2023-12-31 MED ORDER — DENOSUMAB 60 MG/ML ~~LOC~~ SOSY
60.0000 mg | PREFILLED_SYRINGE | Freq: Once | SUBCUTANEOUS | Status: AC
  Administered 2024-02-20: 60 mg via SUBCUTANEOUS

## 2023-12-31 NOTE — Addendum Note (Signed)
 Addended by: Warden Fillers on: 12/31/2023 09:33 PM   Modules accepted: Orders

## 2024-02-06 ENCOUNTER — Other Ambulatory Visit: Payer: Self-pay | Admitting: Student

## 2024-02-06 DIAGNOSIS — G479 Sleep disorder, unspecified: Secondary | ICD-10-CM

## 2024-02-20 ENCOUNTER — Ambulatory Visit: Payer: Medicare Other

## 2024-02-20 DIAGNOSIS — M810 Age-related osteoporosis without current pathological fracture: Secondary | ICD-10-CM

## 2024-02-20 MED ORDER — DENOSUMAB 60 MG/ML ~~LOC~~ SOSY
60.0000 mg | PREFILLED_SYRINGE | Freq: Once | SUBCUTANEOUS | Status: AC
  Administered 2024-08-22: 60 mg via SUBCUTANEOUS

## 2024-02-20 NOTE — Progress Notes (Signed)
Patient presented for Prolia injection to right arm, patient voiced no concerns nor showed any signs of distress during injection

## 2024-02-22 ENCOUNTER — Ambulatory Visit
Admission: RE | Admit: 2024-02-22 | Discharge: 2024-02-22 | Disposition: A | Discharge status: Home / Self Care | Payer: Medicare Other | Source: Ambulatory Visit | Attending: Family Medicine | Admitting: Family Medicine

## 2024-02-22 DIAGNOSIS — Z780 Asymptomatic menopausal state: Secondary | ICD-10-CM

## 2024-02-22 DIAGNOSIS — Z1231 Encounter for screening mammogram for malignant neoplasm of breast: Secondary | ICD-10-CM | POA: Diagnosis present

## 2024-02-22 DIAGNOSIS — M810 Age-related osteoporosis without current pathological fracture: Secondary | ICD-10-CM | POA: Insufficient documentation

## 2024-02-23 ENCOUNTER — Telehealth: Payer: Self-pay | Admitting: Family Medicine

## 2024-02-23 NOTE — Telephone Encounter (Signed)
 I am not currently accepting new patients unless they have a family member I already see.

## 2024-02-23 NOTE — Telephone Encounter (Signed)
 Happy to accept patient. Thanks!

## 2024-02-23 NOTE — Telephone Encounter (Signed)
 LVM with that Dr Cherlyn Cornet is not accepting new patients, they may schedule with Tretha Fu for a New patient appointment.

## 2024-02-23 NOTE — Telephone Encounter (Signed)
 Please advise if we may schedule with Dr Cherlyn Cornet for New Patient   Copied from CRM [id number]. Topic: Appointments - Transfer of Care >> Feb 23, 2024  9:33 AM [place] A wrote: Pt is requesting to transfer FROM: Dr. Valli Gaw Pt is requesting to transfer TO: Dr. Herby Lolling Reason for requested transfer: Dr.Tanya Sueanne Emerald is leaving Practice It is the responsibility of the team the patient would like to transfer to (Dr. Herby Lolling) to reach out to the patient if for any reason this transfer is not acceptable.

## 2024-02-23 NOTE — Telephone Encounter (Signed)
 Patient would like to see if Polly Brink would accept her as a new patient.  She has been transferred to two different patients at [facility] location and would like to seen someone here at [facility]. Her husband is a patient of Dr Crissie Dome, but she would prefer a female provider. Please advise if  she would be willing to accept her as a patient

## 2024-02-27 ENCOUNTER — Ambulatory Visit: Admitting: Family Medicine

## 2024-02-29 ENCOUNTER — Encounter: Payer: Self-pay | Admitting: Primary Care

## 2024-02-29 ENCOUNTER — Ambulatory Visit (INDEPENDENT_AMBULATORY_CARE_PROVIDER_SITE_OTHER): Admitting: Primary Care

## 2024-02-29 VITALS — BP 126/74 | HR 73 | Temp 98.3°F | Ht 63.0 in | Wt 134.0 lb

## 2024-02-29 DIAGNOSIS — M8580 Other specified disorders of bone density and structure, unspecified site: Secondary | ICD-10-CM

## 2024-02-29 DIAGNOSIS — E785 Hyperlipidemia, unspecified: Secondary | ICD-10-CM

## 2024-02-29 DIAGNOSIS — J309 Allergic rhinitis, unspecified: Secondary | ICD-10-CM

## 2024-02-29 DIAGNOSIS — E538 Deficiency of other specified B group vitamins: Secondary | ICD-10-CM

## 2024-02-29 DIAGNOSIS — G479 Sleep disorder, unspecified: Secondary | ICD-10-CM

## 2024-02-29 MED ORDER — ESZOPICLONE 2 MG PO TABS: 2.0000 mg | ORAL_TABLET | Freq: Every evening | ORAL | 0 refills | Status: DC | PRN

## 2024-02-29 NOTE — Progress Notes (Signed)
 Subjective:    Patient ID: Annette Lee, female    DOB: 05-Dec-1935, 88 y.o.   MRN: [id number]  HPI  Annette Lee is a very pleasant 88 y.o. female patient of Dr. Sueanne Emerald who presents today to transfer care.  1) Hyperlipidemia: Currently managed on simvastatin  10 mg daily, she is taking simvastatin  once weekly, has been doing this for decades as she was told to take this once weekly years ago.   She denies a family history of heart attack or stroke in either parents.   Last lipid panel was in January 2025 with LDL of 109. She exercise regularly, she just completed a 5k walk.   2) Osteoporosis: Currently managed on Prolia  injections every 6 months.  Last bone density scan was 1 week ago, osteopenia.  No significant changes compared to prior scan in 2023.  Her last Prolia  injection was in late April 2025. She is not taking vitamin D  due to exhaustion. She does take calcium. She exercises most days of the week.   3) Vitamin B12 Deficiency: Currently managed on vitamin B12 1000 mcg injections once monthly. Last vitamin B12 level was 565 in January 2025. She feels well managed.   4) Sleep Disturbance: Chronic, improved over the years. Currently managed on Lunesta  2 mg for which she uses sparingly if traveling abroad or far distances. She will mostly take 1/2 pill when she does use Lunesta .   BP Readings from Last 3 Encounters:  02/29/24 126/74  12/05/23 122/68  11/06/23 122/62      Review of Systems  Respiratory:  Negative for shortness of breath.   Cardiovascular:  Negative for chest pain.  Gastrointestinal:  Negative for constipation and diarrhea.  Neurological:  Negative for dizziness.  Psychiatric/Behavioral:  Negative for sleep disturbance. The patient is not nervous/anxious.          Past Medical History:  Diagnosis Date   Allergic rhinitis due to pollen    Blood present in stool    Cancer (HCC)    bcc forehead s/p Mohs Dr. Alto Jew (of note [facility] x 2 face and  1x neck)    Cerumen impaction 11/14/2023   Closed fracture of upper end of left fibula 11/04/2019   COVID-19 04/28/2021   [place]  mild sxs   Endometriosis    Environmental and seasonal allergies    GERD (gastroesophageal reflux disease)    H/O shoulder surgery 01/08/2019   Left         Hematuria 01/09/2018   Hyperlipidemia    Impingement syndrome of left shoulder 04/10/2018   Ingrown right big toenail 07/25/2023   Nontraumatic complete tear of left rotator cuff 09/24/2018   Osteopenia 08/2011   T -1.8 in hip   Osteoporosis    Postmenopausal estrogen deficiency 11/13/2023   Rotator cuff tendinitis, left 09/24/2018   Skin lesion 03/02/2018   Sleep disturbance    UTI (urinary tract infection)    Vaginal atrophy     Social History   Socioeconomic History   Marital status: Married    Spouse name: Douglass   Number of children: 0   Years of education: Not on file   Highest education level: Not on file  Occupational History   Occupation: Biochemist, clinical    Comment: Retired  Tobacco Use   Smoking status: Former    Current packs/day: 0.00    Types: Cigarettes    Quit date: 10/30/1966    Years since quitting: 57.3   Smokeless tobacco: Never   Tobacco  comments:    as of 08/2017 quit smoking >15 years ago 1ppd x 35 years no FH lung cancer   Vaping Use   Vaping status: Never Used  Substance and Sexual Activity   Alcohol use: Yes    Alcohol/week: 2.0 standard drinks of alcohol    Types: 2 Shots of liquor per week    Comment: drinks gin per week   Drug use: No   Sexual activity: Never  Other Topics Concern   Not on file  Social History Narrative   Has living will   Husband is health care POA.   Would accept resuscitation but no prolonged ventilation   Not sure about tube feeds   Moved her from [place]     Exercises 3x per week    No kids    Social Drivers of Corporate investment banker Strain: Low Risk  (07/24/2023)   Overall Financial Resource  Strain (CARDIA)    Difficulty of Paying Living Expenses: Not hard at all  Food Insecurity: No Food Insecurity (07/24/2023)   Hunger Vital Sign    Worried About Running Out of Food in the Last Year: Never true    Ran Out of Food in the Last Year: Never true  Transportation Needs: No Transportation Needs (07/24/2023)   PRAPARE - Administrator, Civil Service (Medical): No    Lack of Transportation (Non-Medical): No  Physical Activity: Insufficiently Active (07/24/2023)   Exercise Vital Sign    Days of Exercise per Week: 3 days    Minutes of Exercise per Session: 40 min  Stress: No Stress Concern Present (07/24/2023)   [employer] of Occupational Health - Occupational Stress Questionnaire    Feeling of Stress : Not at all  Social Connections: Socially Integrated (07/24/2023)   Social Connection and Isolation Panel [NHANES]    Frequency of Communication with Friends and Family: Once a week    Frequency of Social Gatherings with Friends and Family: Twice a week    Attends Religious Services: More than 4 times per year    Active Member of [employer] or Organizations: Yes    Attends Banker Meetings: More than 4 times per year    Marital Status: Married  Catering manager Violence: Not At Risk (07/25/2023)   Humiliation, Afraid, Rape, and Kick questionnaire    Fear of Current or Ex-Partner: No    Emotionally Abused: No    Physically Abused: No    Sexually Abused: No    Past Surgical History:  Procedure Laterality Date   ABDOMINAL HYSTERECTOMY     total    APPENDECTOMY     CATARACT EXTRACTION W/ INTRAOCULAR LENS  IMPLANT, BILATERAL     COLONOSCOPY WITH PROPOFOL  N/A 01/22/2016   Procedure: COLONOSCOPY WITH PROPOFOL ;  Surgeon: Luella Sager, MD;  Location: [facility];  Service: Endoscopy;  Laterality: N/A;   ROTATOR CUFF REPAIR Right ~2001   SHOULDER ARTHROSCOPY WITH SUBACROMIAL DECOMPRESSION AND BICEP TENDON REPAIR Left 11/08/2018   Procedure: SHOULDER  ARTHROSCOPY WITH DEBRIDEMENT, DECOMPRESSION AND ROTATOR CUFF REPAIR;  Surgeon: Elner Hahn, MD;  Location: [facility] ORS;  Service: Orthopedics;  Laterality: Left;    Family History  Problem Relation Age of Onset   Cancer Mother        CLL   Cancer Father        colon   Aneurysm Father    Cancer Brother        colon cancer   Heart disease Maternal Uncle  Diabetes Neg Hx    Breast cancer Neg Hx    Bladder Cancer Neg Hx    Kidney cancer Neg Hx     Allergies  Allergen Reactions   Contrast Media [Iodinated Contrast Media] Anaphylaxis    Current Outpatient Medications on File Prior to Visit  Medication Sig Dispense Refill   azelastine  (ASTELIN ) 0.1 % nasal spray Place 2 sprays into both nostrils 2 (two) times daily. Use in each nostril as directed 30 mL 12   cyanocobalamin  (VITAMIN B12) 1000 MCG/ML injection Inject 1 mL (1,000 mcg total) into the muscle every 30 (thirty) days. 1 mL 11   cholecalciferol (VITAMIN D3) 25 MCG (1000 UNIT) tablet Take 2 tablets (2,000 Units total) by mouth daily. (Patient not taking: Reported on 02/29/2024) 180 tablet 3   simvastatin  (ZOCOR ) 10 MG tablet Take 1 tablet (10 mg total) by mouth daily. (Patient not taking: Reported on 02/29/2024) 90 tablet 3   Current Facility-Administered Medications on File Prior to Visit  Medication Dose Route Frequency Provider Last Rate Last Admin   denosumab  (PROLIA ) injection 60 mg  60 mg Subcutaneous Q6 months McLean-Scocuzza, Karon Packer, MD   60 mg at 02/14/23 1547   [START ON 08/20/2024] denosumab  (PROLIA ) injection 60 mg  60 mg Subcutaneous Once Valli Gaw, MD        BP 126/74   Pulse 73   Temp 98.3 F (36.8 C) (Temporal)   Ht 5\' 3"  (1.6 m)   Wt 134 lb (60.8 kg)   SpO2 98%   BMI 23.74 kg/m  Objective:   Physical Exam Cardiovascular:     Rate and Rhythm: Normal rate and regular rhythm.  Pulmonary:     Effort: Pulmonary effort is normal.     Breath sounds: Normal breath sounds.  Musculoskeletal:     Cervical  back: Neck supple.  Skin:    General: Skin is warm and dry.  Neurological:     Mental Status: She is alert and oriented to person, place, and time.  Psychiatric:        Mood and Affect: Mood normal.           Assessment & Plan:  Hyperlipidemia, unspecified hyperlipidemia type Assessment & Plan: Reviewed lipid panel from January 2025. Continue simvastatin  10 mg once weekly.   Allergic rhinitis, unspecified seasonality, unspecified trigger Assessment & Plan: Controlled.  Continue Astelin  nasal spray PRN.    Osteopenia, unspecified location Assessment & Plan: Controlled.  Reviewed bone density scan from May 2025. Continue Prolia  injections semi-annually.  Continue calcium.    Sleep disturbance Assessment & Plan: Controlled.  No concerns today. Continue Lunesta  2 mg for which she uses sparingly.   Orders: -     Eszopiclone ; Take 1 tablet (2 mg total) by mouth at bedtime as needed for sleep. Take immediately before bedtime  Dispense: 30 tablet; Refill: 0  B12 deficiency Assessment & Plan: Controlled.  Continue vitamin B12 injections once monthly. Reviewed vitamin B12 level from January 2025.         Latravis Grine K Vida Nicol, NP

## 2024-02-29 NOTE — Patient Instructions (Signed)
 It was a pleasure to meet you today! Please don't hesitate to contact me with any questions. Welcome to [facility]!

## 2024-02-29 NOTE — Assessment & Plan Note (Signed)
 Controlled.  Continue vitamin B12 injections once monthly. Reviewed vitamin B12 level from January 2025.

## 2024-02-29 NOTE — Assessment & Plan Note (Signed)
 Controlled.  No concerns today. Continue Lunesta  2 mg for which she uses sparingly.

## 2024-02-29 NOTE — Assessment & Plan Note (Signed)
 Reviewed lipid panel from January 2025. Continue simvastatin  10 mg once weekly.

## 2024-02-29 NOTE — Assessment & Plan Note (Signed)
 Controlled.  Reviewed bone density scan from May 2025. Continue Prolia  injections semi-annually.  Continue calcium.

## 2024-02-29 NOTE — Assessment & Plan Note (Signed)
Controlled.  Continue Astelin nasal spray PRN.

## 2024-03-31 ENCOUNTER — Ambulatory Visit: Payer: Self-pay | Admitting: Family Medicine

## 2024-07-02 ENCOUNTER — Telehealth: Payer: Self-pay

## 2024-07-02 NOTE — Telephone Encounter (Signed)
 Copied from CRM [id number]. Topic: Clinical - Medical Advice >> Jul 02, 2024  1:18 PM [name] wrote: Reason for CRM: Patient is calling in to see when she is due for her prolia  shot.

## 2024-07-02 NOTE — Telephone Encounter (Addendum)
 She is due October 29 which would be 6 months from her previous currently injection on 02/20/2024.  Will route to Prolia  team as FYI.  Kelli will you update patient?

## 2024-07-03 NOTE — Telephone Encounter (Signed)
 LVM per DPR advising patient she will be due for prolia  injection 08/21/24.

## 2024-07-22 ENCOUNTER — Other Ambulatory Visit (HOSPITAL_COMMUNITY): Payer: Self-pay

## 2024-07-22 ENCOUNTER — Telehealth: Payer: Self-pay

## 2024-07-22 NOTE — Telephone Encounter (Signed)
 Pt ready for scheduling for PROLIA  on or after : 08/21/24  Option# 1: Buy/Bill (Office supplied medication)  Out-of-pocket cost due at time of clinic visit: $0  Number of injection/visits approved: ---  Primary: MEDICARE Prolia  co-insurance: 0% Admin fee co-insurance: 0%  Secondary: BCBS[place]-FEP Prolia  co-insurance:  Admin fee co-insurance:   Medical Benefit Details: Date Benefits were checked: 07/18/24 Deductible: $257 Met of $257 Required/ Coinsurance: 0%/ Admin Fee: 0%  Prior Auth: N/A PA# Expiration Date:   # of doses approved: ----------------------------------------------------------------------- Option# 2- Med Obtained from pharmacy:  Pharmacy benefit: Copay $--- (Paid to pharmacy) Admin Fee: --- (Pay at clinic)  Prior Auth: --- PA# Expiration Date:   # of doses approved:   If patient wants fill through the pharmacy benefit please send prescription to: ---, and include estimated need by date in rx notes. Pharmacy will ship medication directly to the office.  Patient NOT eligible for Prolia  Copay Card. Copay Card can make patient's cost as little as $25. Link to apply: https://www.amgensupportplus.com/copay  ** This summary of benefits is an estimation of the patient's out-of-pocket cost. Exact cost may very based on individual plan coverage.

## 2024-07-22 NOTE — Telephone Encounter (Signed)
 Annette Lee

## 2024-07-30 ENCOUNTER — Ambulatory Visit (INDEPENDENT_AMBULATORY_CARE_PROVIDER_SITE_OTHER): Admitting: *Deleted

## 2024-07-30 VITALS — Ht 63.0 in | Wt 130.0 lb

## 2024-07-30 DIAGNOSIS — Z0000 Encounter for general adult medical examination without abnormal findings: Secondary | ICD-10-CM

## 2024-07-30 NOTE — Telephone Encounter (Signed)
 Thank you! What is needed from me? BMP orders?

## 2024-07-30 NOTE — Telephone Encounter (Signed)
 Forwarding Prolia  info to new provider for continued care.

## 2024-07-30 NOTE — Patient Instructions (Signed)
 Annette Lee,  Thank you for taking the time for your Medicare Wellness Visit. I appreciate your continued commitment to your health goals. Please review the care plan we discussed, and feel free to reach out if I can assist you further.  Medicare recommends these wellness visits once per year to help you and your care team stay ahead of potential health issues. These visits are designed to focus on prevention, allowing your provider to concentrate on managing your acute and chronic conditions during your regular appointments.  Please note that Annual Wellness Visits do not include a physical exam. Some assessments may be limited, especially if the visit was conducted virtually. If needed, we may recommend a separate in-person follow-up with your provider.  Ongoing Care Seeing your primary care provider every 3 to 6 months helps us  monitor your health and provide consistent, personalized care.  Remember to update your covid vaccine.  Referrals If a referral was made during today's visit and you haven't received any updates within two weeks, please contact the referred provider directly to check on the status.  Recommended Screenings:  Health Maintenance  Topic Date Due   COVID-19 Vaccine (7 - Moderna risk 2024-25 season) 06/24/2024   Medicare Annual Wellness Visit  07/30/2025   DTaP/Tdap/Td vaccine (3 - Td or Tdap) 08/26/2033   Pneumococcal Vaccine for age over 27  Completed   Flu Shot  Completed   DEXA scan (bone density measurement)  Completed   Zoster (Shingles) Vaccine  Completed   Meningitis B Vaccine  Aged Out       07/30/2024    8:54 AM  Advanced Directives  Does Patient Have a Medical Advance Directive? Yes  Type of Estate agent of [place];Living will  Does patient want to make changes to medical advance directive? No - Patient declined  Copy of Healthcare Power of Attorney in Chart? Yes - validated most recent copy scanned in chart (See row information)    Advance Care Planning is important because it: Ensures you receive medical care that aligns with your values, goals, and preferences. Provides guidance to your family and loved ones, reducing the emotional burden of decision-making during critical moments.  Vision: Annual vision screenings are recommended for early detection of glaucoma, cataracts, and diabetic retinopathy. These exams can also reveal signs of chronic conditions such as diabetes and high blood pressure.  Dental: Annual dental screenings help detect early signs of oral cancer, gum disease, and other conditions linked to overall health, including heart disease and diabetes.  Please see the attached documents for additional preventive care recommendations.

## 2024-07-30 NOTE — Progress Notes (Signed)
 Subjective:   Annette Lee is a 88 y.o. who presents for a Medicare Wellness preventive visit.  As a reminder, Annual Wellness Visits don't include a physical exam, and some assessments may be limited, especially if this visit is performed virtually. We may recommend an in-person follow-up visit with your provider if needed.  Visit Complete: Virtual I connected with  [facility] on 07/30/24 by a audio enabled telemedicine application and verified that I am speaking with the correct person using two identifiers.  Patient Location: Home  Provider Location: Home Office  I discussed the limitations of evaluation and management by telemedicine. The patient expressed understanding and agreed to proceed.  Vital Signs: Because this visit was a virtual/telehealth visit, some criteria may be missing or patient reported. Any vitals not documented were not able to be obtained and vitals that have been documented are patient reported.  VideoDeclined- This patient declined Librarian, academic. Therefore the visit was completed with audio only.  Persons Participating in Visit: Patient.  AWV Questionnaire: No: Patient Medicare AWV questionnaire was not completed prior to this visit.  Cardiac Risk Factors include: advanced age (>76men, >85 women);dyslipidemia     Objective:    Today's Vitals   07/30/24 0833  Weight: 130 lb (59 kg)  Height: 5' 3 (1.6 m)   Body mass index is 23.03 kg/m.     07/30/2024    8:54 AM 08/27/2023    2:46 PM 07/25/2023    9:19 AM 03/17/2023    2:19 PM 07/20/2020    9:10 AM 07/18/2019    9:18 AM 07/16/2018    8:40 AM  Advanced Directives  Does Patient Have a Medical Advance Directive? Yes No Yes Yes Yes Yes Yes   Type of Estate agent of [place];Living will  Healthcare Power of [place];Living will Healthcare Power of [employer] of [place];Living will Healthcare Power of [place];Living will  Healthcare Power of [place];Living will  Does patient want to make changes to medical advance directive? No - Patient declined   No - Patient declined No - Patient declined No - Patient declined No - Patient declined   Copy of Healthcare Power of Attorney in Chart? Yes - validated most recent copy scanned in chart (See row information)  No - copy requested No - copy requested No - copy requested No - copy requested No - copy requested      Data saved with a previous flowsheet row definition    Current Medications (verified) Outpatient Encounter Medications as of 07/30/2024  Medication Sig   aspirin EC 325 MG tablet Take 325 mg by mouth daily as needed.   azelastine  (ASTELIN ) 0.1 % nasal spray Place 2 sprays into both nostrils 2 (two) times daily. Use in each nostril as directed (Patient taking differently: Place 2 sprays into both nostrils 2 (two) times daily as needed. Use in each nostril as directed)   calcium carbonate (TUMS EXTRA STRENGTH) 750 MG chewable tablet Chew 1 tablet by mouth daily as needed for heartburn.   cyanocobalamin  (VITAMIN B12) 1000 MCG/ML injection Inject 1 mL (1,000 mcg total) into the muscle every 30 (thirty) days.   eszopiclone  (LUNESTA ) 2 MG TABS tablet Take 1 tablet (2 mg total) by mouth at bedtime as needed for sleep. Take immediately before bedtime   simvastatin  (ZOCOR ) 10 MG tablet Take 1 tablet (10 mg total) by mouth daily.   cholecalciferol (VITAMIN D3) 25 MCG (1000 UNIT) tablet Take 2 tablets (2,000 Units total) by  mouth daily. (Patient not taking: Reported on 07/30/2024)   Facility-Administered Encounter Medications as of 07/30/2024  Medication   denosumab  (PROLIA ) injection 60 mg   [START ON 08/20/2024] denosumab  (PROLIA ) injection 60 mg    Allergies (verified) Contrast media [iodinated contrast media]   History: Past Medical History:  Diagnosis Date   Allergic rhinitis due to pollen    Blood present in stool    Cancer (HCC)    bcc forehead s/p Mohs  Dr. Renita Genetta Potters (of note [facility] x 2 face and 1x neck)    Cerumen impaction 11/14/2023   Closed fracture of upper end of left fibula 11/04/2019   COVID-19 04/28/2021   [place]  mild sxs   Endometriosis    Environmental and seasonal allergies    GERD (gastroesophageal reflux disease)    H/O shoulder surgery 01/08/2019   Left         Hematuria 01/09/2018   Hyperlipidemia    Impingement syndrome of left shoulder 04/10/2018   Ingrown right big toenail 07/25/2023   Nontraumatic complete tear of left rotator cuff 09/24/2018   Osteopenia 08/2011   T -1.8 in hip   Osteoporosis    Postmenopausal estrogen deficiency 11/13/2023   Rotator cuff tendinitis, left 09/24/2018   Skin lesion 03/02/2018   Sleep disturbance    UTI (urinary tract infection)    Vaginal atrophy    Past Surgical History:  Procedure Laterality Date   ABDOMINAL HYSTERECTOMY     total    APPENDECTOMY     CATARACT EXTRACTION W/ INTRAOCULAR LENS  IMPLANT, BILATERAL     COLONOSCOPY WITH PROPOFOL  N/A 01/22/2016   Procedure: COLONOSCOPY WITH PROPOFOL ;  Surgeon: Donnice Vaughn Manes, MD;  Location[facility] ENDOSCOPY;  Service: Endoscopy;  Laterality: N/A;   ROTATOR CUFF REPAIR Right ~2001   SHOULDER ARTHROSCOPY WITH SUBACROMIAL DECOMPRESSION AND BICEP TENDON REPAIR Left 11/08/2018   Procedure: SHOULDER ARTHROSCOPY WITH DEBRIDEMENT, DECOMPRESSION AND ROTATOR CUFF REPAIR;  Surgeon: Edie Norleen PARAS, MD;  Location: [facility] ORS;  Service: Orthopedics;  Laterality: Left;   Family History  Problem Relation Age of Onset   Cancer Mother        CLL   Cancer Father        colon   Aneurysm Father    Cancer Brother        colon cancer   Heart disease Maternal Uncle    Diabetes Neg Hx    Breast cancer Neg Hx    Bladder Cancer Neg Hx    Kidney cancer Neg Hx    Social History   Socioeconomic History   Marital status: Married    Spouse name: Douglass   Number of children: 0   Years of education: Not on file   Highest education  level: Not on file  Occupational History   Occupation: Biochemist, clinical    Comment: Retired  Tobacco Use   Smoking status: Former    Current packs/day: 0.00    Types: Cigarettes    Quit date: 10/30/1966    Years since quitting: 57.7   Smokeless tobacco: Never   Tobacco comments:    as of 08/2017 quit smoking >15 years ago 1ppd x 35 years no FH lung cancer   Vaping Use   Vaping status: Never Used  Substance and Sexual Activity   Alcohol use: Yes    Alcohol/week: 2.0 standard drinks of alcohol    Types: 2 Shots of liquor per week    Comment: drinks gin per week   Drug use:  No   Sexual activity: Never  Other Topics Concern   Not on file  Social History Narrative   Has living will   Husband is health care POA.   Would accept resuscitation but no prolonged ventilation   Not sure about tube feeds   Moved her from [place]     Exercises 3x per week    No kids    Social Drivers of Corporate investment banker Strain: Low Risk  (07/30/2024)   Overall Financial Resource Strain (CARDIA)    Difficulty of Paying Living Expenses: Not hard at all  Food Insecurity: No Food Insecurity (07/30/2024)   Hunger Vital Sign    Worried About Running Out of Food in the Last Year: Never true    Ran Out of Food in the Last Year: Never true  Transportation Needs: No Transportation Needs (07/30/2024)   PRAPARE - Administrator, Civil Service (Medical): No    Lack of Transportation (Non-Medical): No  Physical Activity: Insufficiently Active (07/30/2024)   Exercise Vital Sign    Days of Exercise per Week: 3 days    Minutes of Exercise per Session: 30 min  Stress: No Stress Concern Present (07/30/2024)   [employer] of Occupational Health - Occupational Stress Questionnaire    Feeling of Stress: Not at all  Social Connections: Socially Integrated (07/30/2024)   Social Connection and Isolation Panel    Frequency of Communication with Friends and Family: Three times a  week    Frequency of Social Gatherings with Friends and Family: Twice a week    Attends Religious Services: More than 4 times per year    Active Member of [employer] or Organizations: Yes    Attends Engineer, structural: More than 4 times per year    Marital Status: Married    Tobacco Counseling Counseling given: Not Answered Tobacco comments: as of 08/2017 quit smoking >15 years ago 1ppd x 35 years no FH lung cancer     Clinical Intake:  Pre-visit preparation completed: Yes  Pain : No/denies pain     BMI - recorded: 23.03 Nutritional Status: BMI of 19-24  Normal Nutritional Risks: None Diabetes: No  Lab Results  Component Value Date   HGBA1C 5.5 08/08/2018     How often do you need to have someone help you when you read instructions, pamphlets, or other written materials from your doctor or pharmacy?: 1 - Never  Interpreter Needed?: No  Information entered by :: R. Lourdes Kucharski LPN   Activities of Daily Living     07/30/2024    8:35 AM  In your present state of health, do you have any difficulty performing the following activities:  Hearing? 0  Vision? 0  Difficulty concentrating or making decisions? 0  Walking or climbing stairs? 0  Dressing or bathing? 0  Doing errands, shopping? 0  Preparing Food and eating ? N  Using the Toilet? N  In the past six months, have you accidently leaked urine? Y  Do you have problems with loss of bowel control? N  Managing your Medications? N  Managing your Finances? N  Housekeeping or managing your Housekeeping? N    Patient Care Team: Gretta Comer POUR, NP as PCP - General (Internal Medicine) Art Parley, MD as Consulting Physician (General Practice)  I have updated your Care Teams any recent Medical Services you may have received from other providers in the past year.     Assessment:   This is a routine wellness examination  for [place].  Hearing/Vision screen Hearing Screening - Comments:: Wears aids Vision  Screening - Comments:: glasses   Goals Addressed             This Visit's Progress    Patient Stated       Wants to cook proper meals and finish her quilts       Depression Screen     07/30/2024    8:46 AM 02/29/2024    9:41 AM 12/13/2023    8:19 AM 12/05/2023    1:09 PM 11/06/2023    1:08 PM 07/25/2023    9:11 AM 07/17/2023    1:27 PM  PHQ 2/9 Scores  PHQ - 2 Score 0 0 0 0 0 0 0  PHQ- 9 Score 1  0 0 0 1 0    Fall Risk     07/30/2024    8:38 AM 02/29/2024    9:41 AM 12/13/2023    8:13 AM 12/05/2023    1:09 PM 11/06/2023    1:07 PM  Fall Risk   Falls in the past year? 0 0 0 0 0  Number falls in past yr: 0 0 0 0 0  Injury with Fall? 0 0 0 0 0  Risk for fall due to : No Fall Risks No Fall Risks No Fall Risks No Fall Risks No Fall Risks  Follow up Falls evaluation completed;Falls prevention discussed Falls evaluation completed Falls evaluation completed Falls evaluation completed;Education provided Falls evaluation completed    MEDICARE RISK AT HOME:  Medicare Risk at Home Any stairs in or around the home?: Yes If so, are there any without handrails?: No Home free of loose throw rugs in walkways, pet beds, electrical cords, etc?: No (discussed safety and securing rugs) Adequate lighting in your home to reduce risk of falls?: Yes Life alert?: No Use of a cane, walker or w/c?: No Grab bars in the bathroom?: Yes Shower chair or bench in shower?: No Elevated toilet seat or a handicapped toilet?: No  TIMED UP AND GO:  Was the test performed?  No  Cognitive Function: 6CIT completed    07/10/2017    2:02 PM 06/29/2016   11:15 AM  MMSE - Mini Mental State Exam  Orientation to time 5  5   Orientation to Place 5  5   Registration 3  3   Attention/ Calculation 0  0   Recall 3  3   Language- name 2 objects 0  0   Language- repeat 1 1  Language- follow 3 step command 3  3   Language- read & follow direction 0  0   Write a sentence 0  0   Copy design 0  0   Total score 20   20      Data saved with a previous flowsheet row definition        07/30/2024    8:54 AM 07/25/2023    9:19 AM 07/18/2019    9:23 AM 07/16/2018    9:06 AM  6CIT Screen  What Year? 0 points 0 points 0 points 0 points  What month? 0 points 0 points 0 points 0 points  What time? 0 points 0 points 0 points 0 points  Count back from 20 0 points 0 points 0 points 0 points  Months in reverse 0 points 0 points 0 points 0 points  Repeat phrase 0 points 0 points 0 points 0 points  Total Score 0 points 0 points 0 points 0 points  Immunizations Immunization History  Administered Date(s) Administered   Fluad Quad(high Dose 65+) 06/24/2019, 07/14/2020, 07/30/2021   INFLUENZA, HIGH DOSE SEASONAL PF 07/13/2023   Influenza Split 08/02/2013   Influenza,inj,Quad PF,6+ Mos 07/08/2014, 07/13/2015, 06/29/2016, 07/10/2017, 07/16/2018   Moderna Covid-19 Fall Seasonal Vaccine 14yrs & older 01/31/2023, 07/13/2023   Moderna Covid-19 Vaccine  Bivalent Booster 71yrs & up 09/02/2022   Moderna SARS-COV2 Booster Vaccination 09/08/2020, 03/09/2021   Moderna Sars-Covid-2 Vaccination 11/05/2019, 12/03/2019, 09/08/2020, 03/11/2021, 09/28/2021   PNEUMOCOCCAL CONJUGATE-20 08/04/2022   Pneumococcal Conjugate-13 09/10/2013, 09/11/2014   Pneumococcal Polysaccharide-23 09/10/2013   Rotavirus,unspecified  08/04/2022   Tdap 12/25/2017, 08/27/2023   Zoster Recombinant(Shingrix ) 08/18/2022, 10/19/2022   Zoster, Live 09/11/2014    Screening Tests Health Maintenance  Topic Date Due   Influenza Vaccine  05/24/2024   COVID-19 Vaccine (7 - Moderna risk 2024-25 season) 06/24/2024   Medicare Annual Wellness (AWV)  07/24/2024   DTaP/Tdap/Td (3 - Td or Tdap) 08/26/2033   Pneumococcal Vaccine: 50+ Years  Completed   DEXA SCAN  Completed   Zoster Vaccines- Shingrix   Completed   Meningococcal B Vaccine  Aged Out    Health Maintenance Items Addressed: Discussed the need to update covid vaccine  Additional  Screening:  Vision Screening: Recommended annual ophthalmology exams for early detection of glaucoma and other disorders of the eye. Is the patient up to date with their annual eye exam?  Yes  Who is the provider or what is the name of the office in which the patient attends annual eye exams?  [facility]  Dental Screening: Recommended annual dental exams for proper oral hygiene  Community Resource Referral / Chronic Care Management: CRR required this visit?  No   CCM required this visit?  No   Plan:    I have personally reviewed and noted the following in the patient's chart:   Medical and social history Use of alcohol, tobacco or illicit drugs  Current medications and supplements including opioid prescriptions. Patient is not currently taking opioid prescriptions. Functional ability and status Nutritional status Physical activity Advanced directives List of other physicians Hospitalizations, surgeries, and ER visits in previous 12 months Vitals Screenings to include cognitive, depression, and falls Referrals and appointments  In addition, I have reviewed and discussed with patient certain preventive protocols, quality metrics, and best practice recommendations. A written personalized care plan for preventive services as well as general preventive health recommendations were provided to patient.   Angeline Fredericks, LPN   89/11/7972   After Visit Summary: (MyChart) Due to this being a telephonic visit, the after visit summary with patients personalized plan was offered to patient via MyChart   Notes: Nothing significant to report at this time.

## 2024-07-31 ENCOUNTER — Other Ambulatory Visit: Payer: Self-pay | Admitting: Student

## 2024-07-31 DIAGNOSIS — E538 Deficiency of other specified B group vitamins: Secondary | ICD-10-CM

## 2024-08-06 ENCOUNTER — Other Ambulatory Visit: Payer: Self-pay | Admitting: *Deleted

## 2024-08-06 ENCOUNTER — Telehealth: Payer: Self-pay | Admitting: Primary Care

## 2024-08-06 DIAGNOSIS — M8580 Other specified disorders of bone density and structure, unspecified site: Secondary | ICD-10-CM

## 2024-08-06 NOTE — Telephone Encounter (Signed)
 Copied from CRM [id number]. Topic: General - Call Back - No Documentation >> Aug 05, 2024  3:28 PM Robinson H wrote: Reason for CRM: Patient states she has a message from [place] to return call, no messages or notes in system at time patient called.  [place] [phone]

## 2024-08-12 ENCOUNTER — Ambulatory Visit: Payer: Self-pay | Admitting: Primary Care

## 2024-08-12 ENCOUNTER — Other Ambulatory Visit

## 2024-08-12 DIAGNOSIS — M8580 Other specified disorders of bone density and structure, unspecified site: Secondary | ICD-10-CM

## 2024-08-12 LAB — BASIC METABOLIC PANEL WITH GFR
BUN: 16 mg/dL (ref 6–23)
CO2: 28 meq/L (ref 19–32)
Calcium: 9.3 mg/dL (ref 8.4–10.5)
Chloride: 107 meq/L (ref 96–112)
Creatinine, Ser: 0.73 mg/dL (ref 0.40–1.20)
GFR: 74.6 mL/min (ref >60.00)
Glucose, Bld: 101 mg/dL — ABNORMAL HIGH (ref 70–99)
Potassium: 4 meq/L (ref 3.5–5.1)
Sodium: 142 meq/L (ref 135–145)

## 2024-08-22 ENCOUNTER — Ambulatory Visit (INDEPENDENT_AMBULATORY_CARE_PROVIDER_SITE_OTHER)

## 2024-08-22 DIAGNOSIS — M810 Age-related osteoporosis without current pathological fracture: Secondary | ICD-10-CM | POA: Diagnosis not present

## 2024-08-22 DIAGNOSIS — M8580 Other specified disorders of bone density and structure, unspecified site: Secondary | ICD-10-CM

## 2024-08-22 MED ORDER — DENOSUMAB 60 MG/ML ~~LOC~~ SOSY: 60.0000 mg | PREFILLED_SYRINGE | Freq: Once | SUBCUTANEOUS | Status: AC

## 2024-08-22 NOTE — Progress Notes (Signed)
 After obtaining consent, and per orders of Dr. Randeen, injection of Prolia  given SQ in right arm by Sebastian Rosina Helling. Patient tolerated injection well.

## 2024-11-06 ENCOUNTER — Ambulatory Visit: Admitting: Primary Care

## 2024-11-06 ENCOUNTER — Encounter: Payer: Self-pay | Admitting: Primary Care

## 2024-11-06 ENCOUNTER — Ambulatory Visit: Payer: Self-pay | Admitting: Primary Care

## 2024-11-06 VITALS — BP 142/82 | HR 82 | Temp 98.2°F | Ht 63.0 in

## 2024-11-06 DIAGNOSIS — E785 Hyperlipidemia, unspecified: Secondary | ICD-10-CM

## 2024-11-06 DIAGNOSIS — M8580 Other specified disorders of bone density and structure, unspecified site: Secondary | ICD-10-CM | POA: Diagnosis not present

## 2024-11-06 DIAGNOSIS — M25511 Pain in right shoulder: Secondary | ICD-10-CM | POA: Diagnosis not present

## 2024-11-06 DIAGNOSIS — G479 Sleep disorder, unspecified: Secondary | ICD-10-CM | POA: Diagnosis not present

## 2024-11-06 DIAGNOSIS — M25519 Pain in unspecified shoulder: Secondary | ICD-10-CM | POA: Insufficient documentation

## 2024-11-06 DIAGNOSIS — E559 Vitamin D deficiency, unspecified: Secondary | ICD-10-CM | POA: Diagnosis not present

## 2024-11-06 LAB — COMPREHENSIVE METABOLIC PANEL WITH GFR
ALT: 11 U/L (ref 3–35)
AST: 16 U/L (ref 5–37)
Albumin: 4.1 g/dL (ref 3.5–5.2)
Alkaline Phosphatase: 43 U/L (ref 39–117)
BUN: 14 mg/dL (ref 6–23)
CO2: 31 meq/L (ref 19–32)
Calcium: 9.2 mg/dL (ref 8.4–10.5)
Chloride: 106 meq/L (ref 96–112)
Creatinine, Ser: 0.7 mg/dL (ref 0.40–1.20)
GFR: 78.32 mL/min
Glucose, Bld: 78 mg/dL (ref 70–99)
Potassium: 3.8 meq/L (ref 3.5–5.1)
Sodium: 142 meq/L (ref 135–145)
Total Bilirubin: 0.7 mg/dL (ref 0.2–1.2)
Total Protein: 6.6 g/dL (ref 6.0–8.3)

## 2024-11-06 LAB — LIPID PANEL
Cholesterol: 161 mg/dL (ref 28–200)
HDL: 53.3 mg/dL
LDL Cholesterol: 91 mg/dL (ref 10–99)
NonHDL: 108.15
Total CHOL/HDL Ratio: 3
Triglycerides: 87 mg/dL (ref 10.0–149.0)
VLDL: 17.4 mg/dL (ref 0.0–40.0)

## 2024-11-06 LAB — VITAMIN D 25 HYDROXY (VIT D DEFICIENCY, FRACTURES): VITD: 40.33 ng/mL (ref 30.00–100.00)

## 2024-11-06 MED ORDER — ESZOPICLONE 2 MG PO TABS: 2.0000 mg | ORAL_TABLET | Freq: Every evening | ORAL | 0 refills | Status: AC | PRN

## 2024-11-06 NOTE — Assessment & Plan Note (Signed)
 Bone density scan UTD, reviewed today. Continue the injections semiannually. Continue calcium and vitamin D . Commended her on regular exercise

## 2024-11-06 NOTE — Assessment & Plan Note (Signed)
Continue vitamin D. Repeat level pending.

## 2024-11-06 NOTE — Assessment & Plan Note (Signed)
 Repeat panel pending.  Continue simvastatin  10 mg once weekly for now.

## 2024-11-06 NOTE — Assessment & Plan Note (Signed)
 Exam today overall reassuring, no alarm signs.  Offered further evaluation today for which she kindly declines. She will update if symptoms progress.

## 2024-11-06 NOTE — Assessment & Plan Note (Signed)
 Overall controlled  Continue Lunesta  2 mg as needed for which she takes sparingly. Refill provided

## 2024-11-06 NOTE — Progress Notes (Signed)
 "  Subjective:    Patient ID: Annette Lee, female    DOB: April 28, 1936, 89 y.o.   MRN: [id number]  Enna Warwick is a very pleasant 89 y.o. female with a history of osteopenia, hyperlipidemia, sleep disturbance who presents today for follow up of chronic conditions.   Immunizations: -Tetanus: Completed in 2024 -Influenza: Completed this season  -Shingles: Completed Shingrix  series -Pneumonia: Completed last in 2023  Mammogram: Completed in May 2025 Bone Density Scan: Completed in May 2025  Colonoscopy: Completed in 2020, no further screening given age.  1) Osteopenia: Currently managed on calcium and vitamin D  daily, Prolia  injection semi-annually. Last bone density scan was in May 2025, osteopenia with t score of -1.9 to left total hip and -1.7 to right total hip. She exercises regularly several times during the week.   2) Hyperlipidemia: Currently managed on simvastatin  10 mg once weekly and aspirin 325 mg daily. She is due for repeat lipid panel today. She denies chest pain, shortness of breath.     BP Readings from Last 3 Encounters:  11/06/24 (!) 142/82  02/29/24 126/74  12/05/23 122/68   3) Insomnia: Currently managed on Lunesta  2 mg PRN for which she takes sparingly. She is needing a refill. No concerns today.  4) Shoulder Pain: Acute for the last several weeks to the right shoulder, intermittent, occurs with certain movement. Her pain moves to the mid humeral region. She denies numbness/tingling, weakness, decrease in ROM. History of rotator cuff repairs bilaterally.  She has not taken anything OTC for her symptoms. Overall she's not bothered by her syptoms.   Review of Systems  Respiratory:  Negative for shortness of breath.   Cardiovascular:  Negative for chest pain.  Gastrointestinal:  Negative for constipation and diarrhea.  Musculoskeletal:  Positive for arthralgias.  Neurological:  Negative for dizziness and headaches.  Psychiatric/Behavioral:  The patient is not  nervous/anxious.          Past Medical History:  Diagnosis Date   Allergic rhinitis due to pollen    Blood present in stool    Cancer (HCC)    bcc forehead s/p Mohs Dr. Renita Genetta Potters (of [facility] x 2 face and 1x neck)    Cerumen impaction 11/14/2023   Closed fracture of upper end of left fibula 11/04/2019   COVID-19 04/28/2021   [place]  mild sxs   Endometriosis    Environmental and seasonal allergies    GERD (gastroesophageal reflux disease)    H/O shoulder surgery 01/08/2019   Left         Hematuria 01/09/2018   History of colonic polyps 12/17/2015   Hyperlipidemia    Impingement syndrome of left shoulder 04/10/2018   Ingrown right big toenail 07/25/2023   Nontraumatic complete tear of left rotator cuff 09/24/2018   Osteopenia 08/2011   T -1.8 in hip   Osteoporosis    Postmenopausal estrogen deficiency 11/13/2023   Rotator cuff tendinitis, left 09/24/2018   Skin lesion 03/02/2018   Sleep disturbance    UTI (urinary tract infection)    Vaginal atrophy     Social History   Socioeconomic History   Marital status: Married    Spouse name: Douglass   Number of children: 0   Years of education: Not on file   Highest education level: Not on file  Occupational History   Occupation: Biochemist, clinical    Comment: Retired  Tobacco Use   Smoking status: Former    Current packs/day: 0.00    Types: Cigarettes  Quit date: 10/30/1966    Years since quitting: 58.0   Smokeless tobacco: Never   Tobacco comments:    as of 08/2017 quit smoking >15 years ago 1ppd x 35 years no FH lung cancer   Vaping Use   Vaping status: Never Used  Substance and Sexual Activity   Alcohol use: Yes    Alcohol/week: 2.0 standard drinks of alcohol    Types: 2 Shots of liquor per week    Comment: drinks gin per week   Drug use: No   Sexual activity: Never  Other Topics Concern   Not on file  Social History Narrative   Has living will   Husband is health care POA.    Would accept resuscitation but no prolonged ventilation   Not sure about tube feeds   Moved her from [place]     Exercises 3x per week    No kids    Social Drivers of Health   Tobacco Use: Medium Risk (11/06/2024)   Patient History    Smoking Tobacco Use: Former    Smokeless Tobacco Use: Never    Passive Exposure: Not on file  Financial Resource Strain: Low Risk (07/30/2024)   Overall Financial Resource Strain (CARDIA)    Difficulty of Paying Living Expenses: Not hard at all  Food Insecurity: No Food Insecurity (07/30/2024)   Epic    Worried About Programme Researcher, Broadcasting/film/video in the Last Year: Never true    Ran Out of Food in the Last Year: Never true  Transportation Needs: No Transportation Needs (07/30/2024)   Epic    Lack of Transportation (Medical): No    Lack of Transportation (Non-Medical): No  Physical Activity: Insufficiently Active (07/30/2024)   Exercise Vital Sign    Days of Exercise per Week: 3 days    Minutes of Exercise per Session: 30 min  Stress: No Stress Concern Present (07/30/2024)   [employer] of Occupational Health - Occupational Stress Questionnaire    Feeling of Stress: Not at all  Social Connections: Socially Integrated (07/30/2024)   Social Connection and Isolation Panel    Frequency of Communication with Friends and Family: Three times a week    Frequency of Social Gatherings with Friends and Family: Twice a week    Attends Religious Services: More than 4 times per year    Active Member of Clubs or Organizations: Yes    Attends Banker Meetings: More than 4 times per year    Marital Status: Married  Catering Manager Violence: Not At Risk (07/30/2024)   Epic    Fear of Current or Ex-Partner: No    Emotionally Abused: No    Physically Abused: No    Sexually Abused: No  Depression (PHQ2-9): Low Risk (07/30/2024)   Depression (PHQ2-9)    PHQ-2 Score: 1  Alcohol Screen: Low Risk (07/30/2024)   Alcohol Screen    Last Alcohol Screening Score  (AUDIT): 2  Housing: Unknown (07/30/2024)   Epic    Unable to Pay for Housing in the Last Year: No    Number of Times Moved in the Last Year: Not on file    Homeless in the Last Year: No  Utilities: Not At Risk (07/30/2024)   Epic    Threatened with loss of utilities: No  Health Literacy: Adequate Health Literacy (07/30/2024)   B1300 Health Literacy    Frequency of need for help with medical instructions: Never    Past Surgical History:  Procedure Laterality Date   ABDOMINAL HYSTERECTOMY  total    APPENDECTOMY     CATARACT EXTRACTION W/ INTRAOCULAR LENS  IMPLANT, BILATERAL     COLONOSCOPY WITH PROPOFOL  N/A 01/22/2016   Procedure: COLONOSCOPY WITH PROPOFOL ;  Surgeon: Donnice Vaughn Manes, MD;  Location[facility] ENDOSCOPY;  Service: Endoscopy;  Laterality: N/A;   ROTATOR CUFF REPAIR Right ~2001   SHOULDER ARTHROSCOPY WITH SUBACROMIAL DECOMPRESSION AND BICEP TENDON REPAIR Left 11/08/2018   Procedure: SHOULDER ARTHROSCOPY WITH DEBRIDEMENT, DECOMPRESSION AND ROTATOR CUFF REPAIR;  Surgeon: Edie Norleen PARAS, MD;  Location: [facility] ORS;  Service: Orthopedics;  Laterality: Left;    Family History  Problem Relation Age of Onset   Cancer Mother        CLL   Cancer Father        colon   Aneurysm Father    Cancer Brother        colon cancer   Heart disease Maternal Uncle    Diabetes Neg Hx    Breast cancer Neg Hx    Bladder Cancer Neg Hx    Kidney cancer Neg Hx     Allergies[1]  Medications Ordered Prior to Encounter[2]  BP (!) 142/82 (BP Location: Left Arm, Patient Position: Sitting, Cuff Size: Normal)   Pulse 82   Temp 98.2 F (36.8 C) (Temporal)   Ht 5' 3 (1.6 m)   SpO2 98%   BMI 23.03 kg/m  Objective:   Physical Exam Cardiovascular:     Rate and Rhythm: Normal rate and regular rhythm.  Pulmonary:     Effort: Pulmonary effort is normal.     Breath sounds: Normal breath sounds.  Abdominal:     General: Bowel sounds are normal.     Palpations: Abdomen is soft.      Tenderness: There is no abdominal tenderness.  Musculoskeletal:     Right shoulder: Normal range of motion. Normal strength.     Left shoulder: Normal range of motion. Normal strength.     Cervical back: Neck supple.  Skin:    General: Skin is warm and dry.  Neurological:     Mental Status: She is alert and oriented to person, place, and time.  Psychiatric:        Mood and Affect: Mood normal.     Physical Exam        Assessment & Plan:  Hyperlipidemia, unspecified hyperlipidemia type Assessment & Plan: Repeat panel pending.  Continue simvastatin  10 mg once weekly for now.  Orders: -     Lipid panel -     Comprehensive metabolic panel with GFR  Sleep disturbance Assessment & Plan: Overall controlled  Continue Lunesta  2 mg as needed for which she takes sparingly. Refill provided  Orders: -     Eszopiclone ; Take 1 tablet (2 mg total) by mouth at bedtime as needed for sleep. Take immediately before bedtime  Dispense: 30 tablet; Refill: 0  Osteopenia, unspecified location Assessment & Plan: Bone density scan UTD, reviewed today. Continue the injections semiannually. Continue calcium and vitamin D . Commended her on regular exercise   Acute pain of right shoulder Assessment & Plan: Exam today overall reassuring, no alarm signs.  Offered further evaluation today for which she kindly declines. She will update if symptoms progress.   Vitamin D  deficiency Assessment & Plan: Continue vitamin D . Repeat level pending.  Orders: -     VITAMIN D  25 Hydroxy (Vit-D Deficiency, Fractures)    Assessment and Plan Assessment & Plan         Comer MARLA Gaskins, NP       [  1]  Allergies Allergen Reactions   Contrast Media [Iodinated Contrast Media] Anaphylaxis  [2]  Current Outpatient Medications on File Prior to Visit  Medication Sig Dispense Refill   aspirin EC 325 MG tablet Take 325 mg by mouth daily as needed.     azelastine  (ASTELIN ) 0.1 % nasal  spray Place 2 sprays into both nostrils 2 (two) times daily. Use in each nostril as directed (Patient taking differently: Place 2 sprays into both nostrils 2 (two) times daily as needed. Use in each nostril as directed) 30 mL 12   calcium carbonate (TUMS EXTRA STRENGTH) 750 MG chewable tablet Chew 1 tablet by mouth daily as needed for heartburn.     cyanocobalamin  (VITAMIN B12) 1000 MCG/ML injection Inject 1 mL (1,000 mcg total) into the muscle every 30 (thirty) days. 1 mL 11   simvastatin  (ZOCOR ) 10 MG tablet Take 1 tablet (10 mg total) by mouth daily. 90 tablet 3   cholecalciferol (VITAMIN D3) 25 MCG (1000 UNIT) tablet Take 2 tablets (2,000 Units total) by mouth daily. (Patient not taking: Reported on 11/06/2024) 180 tablet 3   Current Facility-Administered Medications on File Prior to Visit  Medication Dose Route Frequency Provider Last Rate Last Admin   denosumab  (PROLIA ) injection 60 mg  60 mg Subcutaneous Q6 months McLean-Scocuzza, Randine SAILOR, MD   60 mg at 02/14/23 1547   [START ON 02/18/2025] denosumab  (PROLIA ) injection 60 mg  60 mg Subcutaneous Once Tower, Laine LABOR, MD       "

## 2024-11-06 NOTE — Patient Instructions (Signed)
 Stop by the lab prior to leaving today. I will notify you of your results once received.   It was a pleasure to see you today!

## 2024-11-07 ENCOUNTER — Encounter: Admitting: Primary Care

## 2024-11-19 ENCOUNTER — Ambulatory Visit (INDEPENDENT_AMBULATORY_CARE_PROVIDER_SITE_OTHER): Admitting: Primary Care

## 2024-11-19 VITALS — BP 110/70 | HR 61 | Temp 97.0°F | Ht 63.0 in | Wt 136.0 lb

## 2024-11-19 DIAGNOSIS — M47816 Spondylosis without myelopathy or radiculopathy, lumbar region: Secondary | ICD-10-CM | POA: Diagnosis not present

## 2024-11-19 DIAGNOSIS — L918 Other hypertrophic disorders of the skin: Secondary | ICD-10-CM | POA: Insufficient documentation

## 2024-11-19 NOTE — Patient Instructions (Addendum)
 You will either be contacted via phone regarding your referral to dermatology at [facility], or you may receive a letter on your MyChart portal from our referral team with instructions for scheduling an appointment. Please let us  know if you have not been contacted by anyone within two weeks.  Take the prescription to [facility] for shoe inserts.   It was a pleasure to see you today!

## 2024-11-19 NOTE — Progress Notes (Signed)
 "  Subjective:    Patient ID: Annette Lee, female    DOB: Oct 28, 1935, 89 y.o.   MRN: [id number]  Annette Lee is a very pleasant 89 y.o. female with a history of lichen sclerosis, B12 deficiency, allergic rhinitis, vitamin D  deficiency who presents today requesting skin tag removal.  The skin tag is located to the left side of the bridge of her upper nose near the inner canthus of her eye which has been present for years.  Over the years the skin tag has snagged on glasses which has been bothersome.  She will getting new glasses soon and would like the skin tag removed. She denies changes in color and size. She does not see dermatology.   She is also needing a copy of her current shoe insoles to replace the 1 she is wearing now.  She had custom orthotics made through the [facility] and is needing another pair.  She is requesting a prescription today.  Review of Systems  Respiratory:  Negative for shortness of breath.   Cardiovascular:  Negative for chest pain.  Skin:  Negative for color change and wound.         Past Medical History:  Diagnosis Date   Allergic rhinitis due to pollen    Blood present in stool    Cancer (HCC)    bcc forehead s/p Mohs Dr. Renita Genetta Potters (of note [facility] x 2 face and 1x neck)    Cerumen impaction 11/14/2023   Closed fracture of upper end of left fibula 11/04/2019   COVID-19 04/28/2021   [place]  mild sxs   Endometriosis    Environmental and seasonal allergies    GERD (gastroesophageal reflux disease)    H/O shoulder surgery 01/08/2019   Left         Hematuria 01/09/2018   History of colonic polyps 12/17/2015   Hyperlipidemia    Impingement syndrome of left shoulder 04/10/2018   Ingrown right big toenail 07/25/2023   Nontraumatic complete tear of left rotator cuff 09/24/2018   Osteopenia 08/2011   T -1.8 in hip   Osteoporosis    Postmenopausal estrogen deficiency 11/13/2023   Rotator cuff tendinitis, left 09/24/2018   Skin lesion  03/02/2018   Sleep disturbance    UTI (urinary tract infection)    Vaginal atrophy     Social History   Socioeconomic History   Marital status: Married    Spouse name: Douglass   Number of children: 0   Years of education: Not on file   Highest education level: Master's degree (e.g., MA, MS, MEng, MEd, MSW, MBA)  Occupational History   Occupation: Biochemist, clinical    Comment: Retired  Tobacco Use   Smoking status: Former    Current packs/day: 0.00    Types: Cigarettes    Quit date: 10/30/1966    Years since quitting: 58.0   Smokeless tobacco: Never   Tobacco comments:    as of 08/2017 quit smoking >15 years ago 1ppd x 35 years no FH lung cancer   Vaping Use   Vaping status: Never Used  Substance and Sexual Activity   Alcohol use: Yes    Alcohol/week: 2.0 standard drinks of alcohol    Types: 2 Shots of liquor per week    Comment: drinks gin per week   Drug use: No   Sexual activity: Never  Other Topics Concern   Not on file  Social History Narrative   Has living will   Husband is health care POA.  Would accept resuscitation but no prolonged ventilation   Not sure about tube feeds   Moved her from [place]     Exercises 3x per week    No kids    Social Drivers of Health   Tobacco Use: Medium Risk (11/06/2024)   Patient History    Smoking Tobacco Use: Former    Smokeless Tobacco Use: Never    Passive Exposure: Not on file  Financial Resource Strain: Low Risk (11/17/2024)   Overall Financial Resource Strain (CARDIA)    Difficulty of Paying Living Expenses: Not hard at all  Food Insecurity: No Food Insecurity (11/17/2024)   Epic    Worried About Radiation Protection Practitioner of Food in the Last Year: Never true    Ran Out of Food in the Last Year: Never true  Transportation Needs: No Transportation Needs (11/17/2024)   Epic    Lack of Transportation (Medical): No    Lack of Transportation (Non-Medical): No  Physical Activity: Sufficiently Active (11/17/2024)    Exercise Vital Sign    Days of Exercise per Week: 3 days    Minutes of Exercise per Session: 50 min  Stress: No Stress Concern Present (11/17/2024)   [employer] of Occupational Health - Occupational Stress Questionnaire    Feeling of Stress: Not at all  Social Connections: Moderately Integrated (11/17/2024)   Social Connection and Isolation Panel    Frequency of Communication with Friends and Family: Never    Frequency of Social Gatherings with Friends and Family: Once a week    Attends Religious Services: More than 4 times per year    Active Member of Clubs or Organizations: Yes    Attends Banker Meetings: More than 4 times per year    Marital Status: Married  Catering Manager Violence: Not At Risk (07/30/2024)   Epic    Fear of Current or Ex-Partner: No    Emotionally Abused: No    Physically Abused: No    Sexually Abused: No  Depression (PHQ2-9): Low Risk (11/19/2024)   Depression (PHQ2-9)    PHQ-2 Score: 0  Alcohol Screen: Low Risk (11/17/2024)   Alcohol Screen    Last Alcohol Screening Score (AUDIT): 3  Housing: Low Risk (11/17/2024)   Epic    Unable to Pay for Housing in the Last Year: No    Number of Times Moved in the Last Year: 0    Homeless in the Last Year: No  Utilities: Not At Risk (07/30/2024)   Epic    Threatened with loss of utilities: No  Health Literacy: Adequate Health Literacy (07/30/2024)   B1300 Health Literacy    Frequency of need for help with medical instructions: Never    Past Surgical History:  Procedure Laterality Date   ABDOMINAL HYSTERECTOMY     total    APPENDECTOMY     CATARACT EXTRACTION W/ INTRAOCULAR LENS  IMPLANT, BILATERAL     COLONOSCOPY WITH PROPOFOL  N/A 01/22/2016   Procedure: COLONOSCOPY WITH PROPOFOL ;  Surgeon: Donnice Vaughn Manes, MD;  Location: [facility] ENDOSCOPY;  Service: Endoscopy;  Laterality: N/A;   ROTATOR CUFF REPAIR Right ~2001   SHOULDER ARTHROSCOPY WITH SUBACROMIAL DECOMPRESSION AND BICEP TENDON REPAIR Left  11/08/2018   Procedure: SHOULDER ARTHROSCOPY WITH DEBRIDEMENT, DECOMPRESSION AND ROTATOR CUFF REPAIR;  Surgeon: Edie Norleen PARAS, MD;  Location: [facility] ORS;  Service: Orthopedics;  Laterality: Left;    Family History  Problem Relation Age of Onset   Cancer Mother        CLL   Cancer Father  colon   Aneurysm Father    Cancer Brother        colon cancer   Heart disease Maternal Uncle    Diabetes Neg Hx    Breast cancer Neg Hx    Bladder Cancer Neg Hx    Kidney cancer Neg Hx     Allergies[1]  Medications Ordered Prior to Encounter[2]  BP 110/70   Pulse 61   Temp (!) 97 F (36.1 C) (Oral)   Ht 5' 3 (1.6 m)   Wt 136 lb (61.7 kg)   SpO2 98%   BMI 24.09 kg/m  Objective:   Physical Exam Cardiovascular:     Rate and Rhythm: Normal rate.  Pulmonary:     Effort: Pulmonary effort is normal.  Skin:    General: Skin is warm and dry.     Comments: 3 cm rounded, flesh colored skin tag to left side of upper nasal bridge between nose and inner eye canthus.  Neurological:     Mental Status: She is alert.     Physical Exam        Assessment & Plan:  Skin tag Assessment & Plan: Appears benign.  Given location removal in the office today would be challenging. Referral placed to dermatology  Orders: -     Ambulatory referral to Dermatology  Arthritis, lumbar spine Assessment & Plan: Prescription provided today for shoe inserts. She will take the prescription to [facility]     Assessment and Plan Assessment & Plan         Comer MARLA Gaskins, NP       [1]  Allergies Allergen Reactions   Contrast Media [Iodinated Contrast Media] Anaphylaxis  [2]  Current Outpatient Medications on File Prior to Visit  Medication Sig Dispense Refill   aspirin EC 325 MG tablet Take 325 mg by mouth daily as needed.     azelastine  (ASTELIN ) 0.1 % nasal spray Place 2 sprays into both nostrils 2 (two) times daily. Use in each nostril as directed (Patient taking  differently: Place 2 sprays into both nostrils 2 (two) times daily as needed. Use in each nostril as directed) 30 mL 12   calcium carbonate (TUMS EXTRA STRENGTH) 750 MG chewable tablet Chew 1 tablet by mouth daily as needed for heartburn.     cyanocobalamin  (VITAMIN B12) 1000 MCG/ML injection Inject 1 mL (1,000 mcg total) into the muscle every 30 (thirty) days. 1 mL 11   eszopiclone  (LUNESTA ) 2 MG TABS tablet Take 1 tablet (2 mg total) by mouth at bedtime as needed for sleep. Take immediately before bedtime 30 tablet 0   simvastatin  (ZOCOR ) 10 MG tablet Take 1 tablet (10 mg total) by mouth daily. 90 tablet 3   cholecalciferol (VITAMIN D3) 25 MCG (1000 UNIT) tablet Take 2 tablets (2,000 Units total) by mouth daily. (Patient not taking: Reported on 11/19/2024) 180 tablet 3   Current Facility-Administered Medications on File Prior to Visit  Medication Dose Route Frequency Provider Last Rate Last Admin   denosumab  (PROLIA ) injection 60 mg  60 mg Subcutaneous Q6 months McLean-Scocuzza, Randine SAILOR, MD   60 mg at 02/14/23 1547   [START ON 02/18/2025] denosumab  (PROLIA ) injection 60 mg  60 mg Subcutaneous Once Tower, Laine LABOR, MD       "

## 2024-11-19 NOTE — Assessment & Plan Note (Signed)
 Appears benign.  Given location removal in the office today would be challenging. Referral placed to dermatology

## 2024-11-19 NOTE — Assessment & Plan Note (Signed)
 Prescription provided today for shoe inserts. She will take the prescription to [facility]

## 2024-11-28 ENCOUNTER — Ambulatory Visit

## 2024-11-28 DIAGNOSIS — L820 Inflamed seborrheic keratosis: Secondary | ICD-10-CM | POA: Diagnosis not present

## 2024-11-28 DIAGNOSIS — L821 Other seborrheic keratosis: Secondary | ICD-10-CM

## 2024-11-28 NOTE — Progress Notes (Signed)
" °  °  Subjective   Annette Lee is a 89 y.o. female who presents for the following: Lesion(s) of concern . Patient is new patient.  Today patient reports: LOC close to left eyelid would like removal, states she is due for new glasses and current glasses irritate spot. Patient denies any hx of skin cancer, no family hx of skin cancer. Reports she did have a place removed at forehead in the past but was benign.    Review of Systems:    No other skin or systemic complaints except as noted in HPI or Assessment and Plan.  The following portions of the chart were reviewed this encounter and updated as appropriate: medications, allergies, medical history  Relevant Medical History:  n/a   Objective  (SKPE) Well appearing patient in no apparent distress; mood and affect are within normal limits. Examination was performed of the: Focused Exam of: Face   Examination notable for: Seborrheic Keratosis(es): Stuck-on appearing keratotic papule(s) on the trunk, some  irritated with redness, crusting, edema, and/or partial avulsion  Examination limited by: Undergarments, Shoes or socks , and Clothing       face x1 Erythematous stuck-on, waxy papule or plaque  Assessment & Plan  (SKAP)   Benign Lesions/ Findings:  Seborrheic keratosis  - Reassurance provided regarding the benign appearance of lesions noted on exam today; no treatment is indicated in the absence of symptoms/changes. - Reinforced importance of photoprotective strategies including liberal and frequent sunscreen use of a broad-spectrum SPF 30 or greater, use of protective clothing, and sun avoidance for prevention of cutaneous malignancy and photoaging.  Counseled patient on the importance of regular self-skin monitoring as well as routine clinical skin examinations as scheduled.     Was sun protection counseling provided?: No   Level of service outlined above   Patient instructions (SKPI)   Procedures, orders, diagnosis for  this visit:  INFLAMED SEBORRHEIC KERATOSIS face x1 - Destruction of lesion - face x1 Complexity: simple   Destruction method: cryotherapy   Informed consent: discussed and consent obtained   Timeout:  patient name, date of birth, surgical site, and procedure verified Lesion destroyed using liquid nitrogen: Yes   Region frozen until ice ball extended beyond lesion: Yes   Cryo cycles: 1 or 2. Outcome: patient tolerated procedure well with no complications   Post-procedure details: wound care instructions given   Additional details:  Prior to procedure, discussed risks of blister formation, small wound, skin dyspigmentation, or rare scar following cryotherapy. Recommend Vaseline ointment to treated areas while healing.    Inflamed seborrheic keratosis -     Destruction of lesion    Return to clinic: Return if symptoms worsen or fail to improve, for w/ Dr. Raymund.  I, Almetta Nora, RMA, am acting as scribe for Lauraine JAYSON Raymund, MD .   Documentation: I have reviewed the above documentation for accuracy and completeness, and I agree with the above.  Lauraine JAYSON Raymund, MD  "

## 2024-11-28 NOTE — Patient Instructions (Addendum)
 Cryosurgery  Cryosurgery ("freezing") uses liquid nitrogen to destroy certain types of skin lesions. Lowering the temperature of the lesion in a small area surrounding skin destroys the lesion. Immediately following cryosurgery, you will notice redness and swelling of the treatment area. Blistering or weeping may occur, lasting approximately one week which will then be followed by crusting. Most areas will heal completely in 10 to 14 days.  Wash the treated areas daily. Allow soap and water to run over the areas, but do not scrub. Should a scab or crust form, allow it to fall off on its own. Do not remove or pick at it. Application of an ointment  and a bandage may make you feel more comfortable, but it is not necessary. Some people develop an allergy to Neosporin, so we recommend that Vaseline or  Aquaphor be used.  The cryotherapy site will be more sensitive than your surrounding skin. Keep it covered, and remember to apply sunscreen every day to all your sun exposed skin. A scar may remain which is lighter or pinker than your normal skin. Your body will continue to improve your scar for up to one year; however a light-colored scar may remain.  Infection following cryotherapy is rare. However if you are worried about the appearance of the treated area, contact your doctor. We have a physician on call at all times. If you have any concerns about the site, please call our clinic at [phone]   Due to recent changes in healthcare laws, you may see results of your pathology and/or laboratory studies on MyChart before the doctors have had a chance to review them. We understand that in some cases there may be results that are confusing or concerning to you. Please understand that not all results are received at the same time and often the doctors may need to interpret multiple results in order to provide you with the best plan of care or course of treatment. Therefore, we ask that you please give us  2  business days to thoroughly review all your results before contacting the office for clarification. Should we see a critical lab result, you will be contacted sooner.   If You Need Anything After Your Visit  If you have any questions or concerns for your doctor, please call our main line at [phone] and press option 4 to reach your doctor's medical assistant. If no one answers, please leave a voicemail as directed and we will return your call as soon as possible. Messages left after 4 pm will be answered the following business day.   You may also send us  a message via MyChart. We typically respond to MyChart messages within 1-2 business days.  For prescription refills, please ask your pharmacy to contact our office. Our fax number is [phone].  If you have an urgent issue when the clinic is closed that cannot wait until the next business day, you can page your doctor at the number below.    Please note that while we do our best to be available for urgent issues outside of office hours, we are not available 24/7.   If you have an urgent issue and are unable to reach us , you may choose to seek medical care at your doctor's office, retail clinic, urgent care center, or emergency room.  If you have a medical emergency, please immediately call 911 or go to the emergency department.  Pager Numbers  - Dr. Hester: [phone]  - Dr. Jackquline: [phone]  - Dr. Claudene: [phone]   -  Dr. Raymund: [phone]  In the event of inclement weather, please call our main line at [phone] for an update on the status of any delays or closures.  Dermatology Medication Tips: Please keep the boxes that topical medications come in in order to help keep track of the instructions about where and how to use these. Pharmacies typically print the medication instructions only on the boxes and not directly on the medication tubes.   If your medication is too expensive, please contact our office  at [phone] option 4 or send us  a message through MyChart.   We are unable to tell what your co-pay for medications will be in advance as this is different depending on your insurance coverage. However, we may be able to find a substitute medication at lower cost or fill out paperwork to get insurance to cover a needed medication.   If a prior authorization is required to get your medication covered by your insurance company, please allow us  1-2 business days to complete this process.  Drug prices often vary depending on where the prescription is filled and some pharmacies may offer cheaper prices.  The website www.goodrx.com contains coupons for medications through different pharmacies. The prices here do not account for what the cost may be with help from insurance (it may be cheaper with your insurance), but the website can give you the price if you did not use any insurance.  - You can print the associated coupon and take it with your prescription to the pharmacy.  - You may also stop by our office during regular business hours and pick up a GoodRx coupon card.  - If you need your prescription sent electronically to a different pharmacy, notify our office through [facility] or by phone at [phone] option 4.     Si Usted Necesita Algo Despus de Su Visita  Tambin puede enviarnos un mensaje a travs de Clinical Cytogeneticist. Por lo general respondemos a los mensajes de MyChart en el transcurso de 1 a 2 das hbiles.  Para renovar recetas, por favor pida a su farmacia que se ponga en contacto con nuestra oficina. Randi lakes de fax es [place] [phone].  Si tiene un asunto urgente cuando la clnica est cerrada y que no puede esperar hasta el siguiente da hbil, puede llamar/localizar a su doctor(a) al nmero que aparece a continuacin.   Por favor, tenga en cuenta que aunque hacemos todo lo posible para estar disponibles para asuntos urgentes fuera del horario de [place], no estamos  disponibles las 24 horas del da, los 7 [address] de la [place].   Si tiene un problema urgente y no puede comunicarse con nosotros, puede optar por buscar atencin mdica  en el consultorio de su doctor(a), en una clnica privada, en un centro de atencin urgente o en una sala de emergencias.  Si tiene engineer, drilling, por favor llame inmediatamente al 911 o vaya a la sala de emergencias.  Nmeros de bper  - Dr. Hester: [phone]  - Dra. Jackquline: [phone]  - Dr. Claudene: [phone]  - Dra. Kitts: [phone]  En caso de inclemencias del [place], por favor llame a nuestra lnea principal al [phone] para una actualizacin sobre el estado de cualquier retraso o cierre.  Consejos para la medicacin en dermatologa: Por favor, guarde las cajas en las que vienen los medicamentos de uso tpico para ayudarle a seguir las instrucciones sobre dnde y cmo usarlos. Las farmacias generalmente imprimen las instrucciones del medicamento slo en las cajas y  no directamente en los tubos del medicamento.   Si su medicamento es muy caro, por favor, pngase en contacto con landry rieger llamando al [phone] y presione la opcin 4 o envenos un mensaje a travs de Clinical Cytogeneticist.   No podemos decirle cul ser su copago por los medicamentos por adelantado ya que esto es diferente dependiendo de la cobertura de su seguro. Sin embargo, es posible que podamos encontrar un medicamento sustituto a audiological scientist un formulario para que el seguro cubra el medicamento que se considera necesario.   Si se requiere una autorizacin previa para que su compaa de seguros cubra su medicamento, por favor permtanos de 1 a 2 das hbiles para completar este proceso.  Los precios de los medicamentos varan con frecuencia dependiendo del environmental consultant de dnde se surte la receta y alguna farmacias pueden ofrecer precios ms baratos.  El sitio web www.goodrx.com tiene cupones para medicamentos de engineer, civil (consulting). Los precios aqu no tienen en cuenta lo que podra costar con la ayuda del seguro (puede ser ms barato con su seguro), pero el sitio web puede darle el precio si no utiliz tourist information centre manager.  - Puede imprimir el cupn correspondiente y llevarlo con su receta a la farmacia.  - Tambin puede pasar por nuestra oficina durante el horario de atencin regular y education officer, museum una tarjeta de cupones de GoodRx.  - Si necesita que su receta se enve electrnicamente a una farmacia diferente, informe a nuestra oficina a travs de MyChart de  o por telfono llamando al [phone] y presione la opcin 4.

## 2025-08-05 ENCOUNTER — Ambulatory Visit

## 2025-11-11 ENCOUNTER — Encounter: Admitting: Primary Care
